# Patient Record
Sex: Female | Born: 1975 | Race: Black or African American | Hispanic: No | Marital: Single | Smoking: Never smoker
Health system: Southern US, Community
[De-identification: ages and names within clinical notes are randomized; demographics above are authoritative.]

## PROBLEM LIST (undated history)

## (undated) DIAGNOSIS — G4730 Sleep apnea, unspecified: Secondary | ICD-10-CM

## (undated) DIAGNOSIS — I10 Essential (primary) hypertension: Secondary | ICD-10-CM

## (undated) DIAGNOSIS — F419 Anxiety disorder, unspecified: Secondary | ICD-10-CM

## (undated) DIAGNOSIS — F32A Depression, unspecified: Secondary | ICD-10-CM

## (undated) DIAGNOSIS — R011 Cardiac murmur, unspecified: Secondary | ICD-10-CM

## (undated) DIAGNOSIS — M1990 Unspecified osteoarthritis, unspecified site: Secondary | ICD-10-CM

## (undated) DIAGNOSIS — D649 Anemia, unspecified: Secondary | ICD-10-CM

## (undated) DIAGNOSIS — K219 Gastro-esophageal reflux disease without esophagitis: Secondary | ICD-10-CM

## (undated) HISTORY — DX: Gastro-esophageal reflux disease without esophagitis: K21.9

## (undated) HISTORY — DX: Sleep apnea, unspecified: G47.30

## (undated) HISTORY — DX: Cardiac murmur, unspecified: R01.1

## (undated) HISTORY — DX: Anemia, unspecified: D64.9

## (undated) HISTORY — DX: Depression, unspecified: F32.A

## (undated) HISTORY — DX: Anxiety disorder, unspecified: F41.9

## (undated) HISTORY — PX: BREAST SURGERY: SHX581

## (undated) HISTORY — PX: FOOT SURGERY: SHX648

---

## 2000-02-22 ENCOUNTER — Emergency Department (HOSPITAL_COMMUNITY): Admission: EM | Admit: 2000-02-22 | Discharge: 2000-02-22 | Payer: Self-pay | Admitting: Emergency Medicine

## 2000-03-31 ENCOUNTER — Emergency Department (HOSPITAL_COMMUNITY): Admission: EM | Admit: 2000-03-31 | Discharge: 2000-03-31 | Payer: Self-pay | Admitting: *Deleted

## 2000-12-10 ENCOUNTER — Other Ambulatory Visit: Admission: RE | Admit: 2000-12-10 | Discharge: 2000-12-10 | Payer: Self-pay | Admitting: Obstetrics and Gynecology

## 2000-12-17 ENCOUNTER — Ambulatory Visit (HOSPITAL_COMMUNITY): Admission: RE | Admit: 2000-12-17 | Discharge: 2000-12-17 | Payer: Self-pay | Admitting: Obstetrics and Gynecology

## 2000-12-17 ENCOUNTER — Encounter: Payer: Self-pay | Admitting: Obstetrics and Gynecology

## 2001-02-12 ENCOUNTER — Inpatient Hospital Stay (HOSPITAL_COMMUNITY): Admission: AD | Admit: 2001-02-12 | Discharge: 2001-02-12 | Payer: Self-pay | Admitting: Obstetrics and Gynecology

## 2001-02-12 ENCOUNTER — Encounter: Payer: Self-pay | Admitting: Obstetrics and Gynecology

## 2001-03-18 ENCOUNTER — Inpatient Hospital Stay (HOSPITAL_COMMUNITY): Admission: AD | Admit: 2001-03-18 | Discharge: 2001-03-18 | Payer: Self-pay | Admitting: Obstetrics & Gynecology

## 2001-06-29 ENCOUNTER — Inpatient Hospital Stay (HOSPITAL_COMMUNITY): Admission: AD | Admit: 2001-06-29 | Discharge: 2001-06-29 | Payer: Self-pay | Admitting: Obstetrics and Gynecology

## 2001-07-18 ENCOUNTER — Inpatient Hospital Stay (HOSPITAL_COMMUNITY): Admission: AD | Admit: 2001-07-18 | Discharge: 2001-07-18 | Payer: Self-pay | Admitting: Obstetrics and Gynecology

## 2001-07-22 ENCOUNTER — Inpatient Hospital Stay (HOSPITAL_COMMUNITY): Admission: AD | Admit: 2001-07-22 | Discharge: 2001-07-22 | Payer: Self-pay | Admitting: Obstetrics & Gynecology

## 2001-07-22 ENCOUNTER — Inpatient Hospital Stay (HOSPITAL_COMMUNITY): Admission: AD | Admit: 2001-07-22 | Discharge: 2001-07-22 | Payer: Self-pay | Admitting: Obstetrics and Gynecology

## 2001-07-23 ENCOUNTER — Inpatient Hospital Stay (HOSPITAL_COMMUNITY): Admission: AD | Admit: 2001-07-23 | Discharge: 2001-07-25 | Payer: Self-pay | Admitting: Obstetrics and Gynecology

## 2002-03-08 ENCOUNTER — Emergency Department (HOSPITAL_COMMUNITY): Admission: EM | Admit: 2002-03-08 | Discharge: 2002-03-08 | Payer: Self-pay | Admitting: Emergency Medicine

## 2002-06-07 ENCOUNTER — Emergency Department (HOSPITAL_COMMUNITY): Admission: EM | Admit: 2002-06-07 | Discharge: 2002-06-07 | Payer: Self-pay | Admitting: Emergency Medicine

## 2002-08-02 ENCOUNTER — Emergency Department (HOSPITAL_COMMUNITY): Admission: EM | Admit: 2002-08-02 | Discharge: 2002-08-02 | Payer: Self-pay | Admitting: Emergency Medicine

## 2002-08-14 ENCOUNTER — Emergency Department (HOSPITAL_COMMUNITY): Admission: EM | Admit: 2002-08-14 | Discharge: 2002-08-15 | Payer: Self-pay | Admitting: Emergency Medicine

## 2003-03-04 ENCOUNTER — Emergency Department (HOSPITAL_COMMUNITY): Admission: AD | Admit: 2003-03-04 | Discharge: 2003-03-04 | Payer: Self-pay | Admitting: Family Medicine

## 2003-05-04 ENCOUNTER — Emergency Department (HOSPITAL_COMMUNITY): Admission: EM | Admit: 2003-05-04 | Discharge: 2003-05-04 | Payer: Self-pay | Admitting: Emergency Medicine

## 2003-05-14 ENCOUNTER — Emergency Department (HOSPITAL_COMMUNITY): Admission: AD | Admit: 2003-05-14 | Discharge: 2003-05-14 | Payer: Self-pay | Admitting: Family Medicine

## 2003-06-25 ENCOUNTER — Emergency Department (HOSPITAL_COMMUNITY): Admission: EM | Admit: 2003-06-25 | Discharge: 2003-06-25 | Payer: Self-pay | Admitting: Emergency Medicine

## 2003-08-03 ENCOUNTER — Emergency Department (HOSPITAL_COMMUNITY): Admission: EM | Admit: 2003-08-03 | Discharge: 2003-08-03 | Payer: Self-pay | Admitting: Emergency Medicine

## 2003-09-20 ENCOUNTER — Emergency Department (HOSPITAL_COMMUNITY): Admission: EM | Admit: 2003-09-20 | Discharge: 2003-09-20 | Payer: Self-pay | Admitting: Family Medicine

## 2004-01-20 ENCOUNTER — Emergency Department (HOSPITAL_COMMUNITY): Admission: EM | Admit: 2004-01-20 | Discharge: 2004-01-21 | Payer: Self-pay | Admitting: Emergency Medicine

## 2004-02-29 ENCOUNTER — Emergency Department (HOSPITAL_COMMUNITY): Admission: EM | Admit: 2004-02-29 | Discharge: 2004-02-29 | Payer: Self-pay | Admitting: Emergency Medicine

## 2004-06-08 ENCOUNTER — Emergency Department (HOSPITAL_COMMUNITY): Admission: EM | Admit: 2004-06-08 | Discharge: 2004-06-08 | Payer: Self-pay | Admitting: Emergency Medicine

## 2004-07-13 ENCOUNTER — Emergency Department (HOSPITAL_COMMUNITY): Admission: EM | Admit: 2004-07-13 | Discharge: 2004-07-13 | Payer: Self-pay | Admitting: Emergency Medicine

## 2004-08-15 ENCOUNTER — Inpatient Hospital Stay (HOSPITAL_COMMUNITY): Admission: AD | Admit: 2004-08-15 | Discharge: 2004-08-15 | Payer: Self-pay | Admitting: Obstetrics and Gynecology

## 2004-08-22 ENCOUNTER — Inpatient Hospital Stay (HOSPITAL_COMMUNITY): Admission: AD | Admit: 2004-08-22 | Discharge: 2004-08-22 | Payer: Self-pay | Admitting: Obstetrics and Gynecology

## 2004-08-22 ENCOUNTER — Encounter (INDEPENDENT_AMBULATORY_CARE_PROVIDER_SITE_OTHER): Payer: Self-pay | Admitting: Specialist

## 2004-11-08 ENCOUNTER — Emergency Department (HOSPITAL_COMMUNITY): Admission: EM | Admit: 2004-11-08 | Discharge: 2004-11-08 | Payer: Self-pay | Admitting: Emergency Medicine

## 2004-12-22 ENCOUNTER — Emergency Department (HOSPITAL_COMMUNITY): Admission: EM | Admit: 2004-12-22 | Discharge: 2004-12-22 | Payer: Self-pay | Admitting: Family Medicine

## 2005-09-19 ENCOUNTER — Emergency Department (HOSPITAL_COMMUNITY): Admission: EM | Admit: 2005-09-19 | Discharge: 2005-09-19 | Payer: Self-pay | Admitting: Family Medicine

## 2005-11-08 ENCOUNTER — Inpatient Hospital Stay (HOSPITAL_COMMUNITY): Admission: AD | Admit: 2005-11-08 | Discharge: 2005-11-08 | Payer: Self-pay | Admitting: Gynecology

## 2006-01-18 ENCOUNTER — Inpatient Hospital Stay (HOSPITAL_COMMUNITY): Admission: AD | Admit: 2006-01-18 | Discharge: 2006-01-18 | Payer: Self-pay | Admitting: Obstetrics & Gynecology

## 2006-02-14 ENCOUNTER — Inpatient Hospital Stay (HOSPITAL_COMMUNITY): Admission: AD | Admit: 2006-02-14 | Discharge: 2006-02-14 | Payer: Self-pay | Admitting: Obstetrics & Gynecology

## 2006-02-14 ENCOUNTER — Ambulatory Visit: Payer: Self-pay | Admitting: Obstetrics & Gynecology

## 2006-04-18 ENCOUNTER — Inpatient Hospital Stay (HOSPITAL_COMMUNITY): Admission: AD | Admit: 2006-04-18 | Discharge: 2006-04-18 | Payer: Self-pay | Admitting: Obstetrics and Gynecology

## 2006-05-18 ENCOUNTER — Inpatient Hospital Stay (HOSPITAL_COMMUNITY): Admission: AD | Admit: 2006-05-18 | Discharge: 2006-05-18 | Payer: Self-pay | Admitting: Obstetrics and Gynecology

## 2006-06-02 ENCOUNTER — Inpatient Hospital Stay (HOSPITAL_COMMUNITY): Admission: AD | Admit: 2006-06-02 | Discharge: 2006-06-02 | Payer: Self-pay | Admitting: Obstetrics & Gynecology

## 2006-06-08 ENCOUNTER — Inpatient Hospital Stay (HOSPITAL_COMMUNITY): Admission: AD | Admit: 2006-06-08 | Discharge: 2006-06-10 | Payer: Self-pay | Admitting: Obstetrics & Gynecology

## 2007-04-03 ENCOUNTER — Other Ambulatory Visit: Payer: Self-pay | Admitting: Family Medicine

## 2007-04-03 ENCOUNTER — Emergency Department (HOSPITAL_COMMUNITY): Admission: EM | Admit: 2007-04-03 | Discharge: 2007-04-03 | Payer: Self-pay | Admitting: Emergency Medicine

## 2007-07-15 ENCOUNTER — Emergency Department (HOSPITAL_COMMUNITY): Admission: EM | Admit: 2007-07-15 | Discharge: 2007-07-15 | Payer: Self-pay | Admitting: Family Medicine

## 2007-07-23 ENCOUNTER — Emergency Department (HOSPITAL_COMMUNITY): Admission: EM | Admit: 2007-07-23 | Discharge: 2007-07-23 | Payer: Self-pay | Admitting: Emergency Medicine

## 2010-01-26 ENCOUNTER — Emergency Department (HOSPITAL_COMMUNITY): Admission: EM | Admit: 2010-01-26 | Discharge: 2010-01-26 | Payer: Self-pay | Admitting: Emergency Medicine

## 2010-08-04 LAB — POCT CARDIAC MARKERS
CKMB, poc: <1 ng/mL — ABNORMAL LOW (ref 1.0–8.0)
Myoglobin, poc: 53.1 ng/mL (ref 12–200)
Troponin i, poc: <0.05 ng/mL (ref 0.00–0.09)

## 2010-08-04 LAB — POCT I-STAT, CHEM 8
BUN: 11 mg/dL (ref 6–23)
Calcium, Ion: 1.14 mmol/L (ref 1.12–1.32)
Chloride: 103 mEq/L (ref 96–112)
Glucose, Bld: 88 mg/dL (ref 70–99)
Sodium: 139 mEq/L (ref 135–145)
TCO2: 27 mmol/L (ref 0–100)

## 2010-08-10 ENCOUNTER — Emergency Department (HOSPITAL_COMMUNITY)
Admission: EM | Admit: 2010-08-10 | Discharge: 2010-08-10 | Disposition: A | Discharge status: Home / Self Care | Payer: Medicaid Other | Attending: Emergency Medicine | Admitting: Emergency Medicine

## 2010-08-10 DIAGNOSIS — H9209 Otalgia, unspecified ear: Secondary | ICD-10-CM | POA: Insufficient documentation

## 2010-08-10 DIAGNOSIS — J329 Chronic sinusitis, unspecified: Secondary | ICD-10-CM | POA: Insufficient documentation

## 2010-08-10 DIAGNOSIS — J3489 Other specified disorders of nose and nasal sinuses: Secondary | ICD-10-CM | POA: Insufficient documentation

## 2010-08-10 DIAGNOSIS — J020 Streptococcal pharyngitis: Secondary | ICD-10-CM | POA: Insufficient documentation

## 2010-08-10 DIAGNOSIS — I10 Essential (primary) hypertension: Secondary | ICD-10-CM | POA: Insufficient documentation

## 2010-08-26 ENCOUNTER — Emergency Department (HOSPITAL_COMMUNITY)
Admission: EM | Admit: 2010-08-26 | Discharge: 2010-08-26 | Disposition: A | Discharge status: Home / Self Care | Payer: Medicaid Other | Attending: Emergency Medicine | Admitting: Emergency Medicine

## 2010-08-26 DIAGNOSIS — I10 Essential (primary) hypertension: Secondary | ICD-10-CM | POA: Insufficient documentation

## 2010-08-26 DIAGNOSIS — F411 Generalized anxiety disorder: Secondary | ICD-10-CM | POA: Insufficient documentation

## 2011-02-13 LAB — RAPID STREP SCREEN (MED CTR MEBANE ONLY): Streptococcus, Group A Screen (Direct): NEGATIVE

## 2011-02-28 LAB — DIFFERENTIAL
Basophils Absolute: 0
Basophils Relative: 1
Eosinophils Relative: 1
Lymphocytes Relative: 23
Monocytes Relative: 8
Neutro Abs: 4.1

## 2011-02-28 LAB — I-STAT 8, (EC8 V) (CONVERTED LAB)
BUN: 14
Bicarbonate: 26.8 — ABNORMAL HIGH
Chloride: 106
Glucose, Bld: 85
HCT: 41
Operator id: 146091
Potassium: 4
TCO2: 28
pCO2, Ven: 52.9 — ABNORMAL HIGH

## 2011-02-28 LAB — CBC
MCV: 73.7 — ABNORMAL LOW
Platelets: 315
RDW: 15.3
WBC: 6

## 2011-02-28 LAB — POCT CARDIAC MARKERS
CKMB, poc: 1.1
CKMB, poc: <1 — ABNORMAL LOW
Operator id: 146091
Operator id: 265201
Troponin i, poc: <0.05

## 2011-02-28 LAB — POCT I-STAT CREATININE: Creatinine, Ser: 0.9

## 2011-02-28 LAB — POCT PREGNANCY, URINE
Operator id: 13344
Preg Test, Ur: NEGATIVE

## 2011-05-11 ENCOUNTER — Inpatient Hospital Stay (HOSPITAL_COMMUNITY)
Admission: AD | Admit: 2011-05-11 | Discharge: 2011-05-11 | Disposition: A | Discharge status: Home / Self Care | Payer: Self-pay | Source: Ambulatory Visit | Attending: Obstetrics & Gynecology | Admitting: Obstetrics & Gynecology

## 2011-05-11 ENCOUNTER — Encounter (HOSPITAL_COMMUNITY): Payer: Self-pay | Admitting: *Deleted

## 2011-05-11 DIAGNOSIS — M25519 Pain in unspecified shoulder: Secondary | ICD-10-CM | POA: Insufficient documentation

## 2011-05-11 DIAGNOSIS — M545 Low back pain, unspecified: Secondary | ICD-10-CM | POA: Insufficient documentation

## 2011-05-11 DIAGNOSIS — R109 Unspecified abdominal pain: Secondary | ICD-10-CM | POA: Insufficient documentation

## 2011-05-11 HISTORY — DX: Essential (primary) hypertension: I10

## 2011-05-11 LAB — POCT PREGNANCY, URINE: Preg Test, Ur: NEGATIVE

## 2011-05-11 LAB — WET PREP, GENITAL: Yeast Wet Prep HPF POC: NONE SEEN

## 2011-05-11 LAB — URINALYSIS, ROUTINE W REFLEX MICROSCOPIC
Bilirubin Urine: NEGATIVE
Nitrite: NEGATIVE
Specific Gravity, Urine: 1.025 (ref 1.005–1.030)

## 2011-05-11 LAB — URINE MICROSCOPIC-ADD ON

## 2011-05-11 NOTE — Progress Notes (Signed)
Pt in c/o one episode of sharp pain in right shoulder that radiates down back last night.  Reports numbness in right arm yesterday, slightly weak today.  Reports lower back pain and mild lower abdominal cramping.  Denies any bleeding or discharge.

## 2011-05-11 NOTE — Progress Notes (Addendum)
Yesterday wasn't feeling so good.  Last night had pain in rt upper shoulder, down into back.  Has been cramping past couple days.  No cycle/ has IUD. (usually does have one). Rt side was numb last night, is still kind of week.

## 2011-05-12 LAB — GC/CHLAMYDIA PROBE AMP, GENITAL
Chlamydia, DNA Probe: NEGATIVE
GC Probe Amp, Genital: NEGATIVE

## 2011-12-08 ENCOUNTER — Emergency Department (HOSPITAL_COMMUNITY)
Admission: EM | Admit: 2011-12-08 | Discharge: 2011-12-08 | Discharge status: Against Medical Advice | Payer: Medicaid Other | Attending: Emergency Medicine | Admitting: Emergency Medicine

## 2011-12-08 ENCOUNTER — Encounter (HOSPITAL_COMMUNITY): Payer: Self-pay | Admitting: *Deleted

## 2011-12-08 DIAGNOSIS — R109 Unspecified abdominal pain: Secondary | ICD-10-CM | POA: Insufficient documentation

## 2011-12-08 LAB — CBC WITH DIFFERENTIAL/PLATELET
Basophils Absolute: 0 K/uL (ref 0.0–0.1)
Basophils Relative: 0 % (ref 0–1)
Eosinophils Absolute: 0.1 K/uL (ref 0.0–0.7)
Eosinophils Relative: 1 % (ref 0–5)
HCT: 34.4 % — ABNORMAL LOW (ref 36.0–46.0)
Hemoglobin: 10.9 g/dL — ABNORMAL LOW (ref 12.0–15.0)
Lymphocytes Relative: 16 % (ref 12–46)
Lymphs Abs: 1.4 K/uL (ref 0.7–4.0)
MCH: 23.6 pg — ABNORMAL LOW (ref 26.0–34.0)
MCHC: 31.7 g/dL (ref 30.0–36.0)
MCV: 74.5 fL — ABNORMAL LOW (ref 78.0–100.0)
Monocytes Absolute: 0.6 K/uL (ref 0.1–1.0)
Monocytes Relative: 7 % (ref 3–12)
Neutro Abs: 6.9 K/uL (ref 1.7–7.7)
Neutrophils Relative %: 76 % (ref 43–77)
Platelets: 325 K/uL (ref 150–400)
RBC: 4.62 MIL/uL (ref 3.87–5.11)
RDW: 15.2 % (ref 11.5–15.5)
WBC: 9 K/uL (ref 4.0–10.5)

## 2011-12-08 LAB — URINE MICROSCOPIC-ADD ON

## 2011-12-08 LAB — BASIC METABOLIC PANEL
BUN: 10 mg/dL (ref 6–23)
CO2: 27 mEq/L (ref 19–32)
Calcium: 9.2 mg/dL (ref 8.4–10.5)
Glucose, Bld: 89 mg/dL (ref 70–99)
Potassium: 3.7 mEq/L (ref 3.5–5.1)
Sodium: 132 mEq/L — ABNORMAL LOW (ref 135–145)

## 2011-12-08 LAB — LIPASE, BLOOD: Lipase: 30 U/L (ref 11–59)

## 2011-12-08 LAB — URINALYSIS, ROUTINE W REFLEX MICROSCOPIC
Bilirubin Urine: NEGATIVE
Glucose, UA: NEGATIVE mg/dL
Hgb urine dipstick: NEGATIVE
Specific Gravity, Urine: 1.026 (ref 1.005–1.030)

## 2011-12-08 LAB — POCT PREGNANCY, URINE: Preg Test, Ur: NEGATIVE

## 2011-12-08 NOTE — ED Notes (Signed)
Pt c/o abd pain that started 2-3days ago, gradually getting worse, reports pain radiates around right side to back. Denies n/v.

## 2012-05-22 ENCOUNTER — Emergency Department (HOSPITAL_COMMUNITY)
Admission: EM | Admit: 2012-05-22 | Discharge: 2012-05-22 | Disposition: A | Discharge status: Home / Self Care | Payer: Medicaid Other | Attending: Emergency Medicine | Admitting: Emergency Medicine

## 2012-05-22 ENCOUNTER — Encounter (HOSPITAL_COMMUNITY): Payer: Self-pay | Admitting: Nurse Practitioner

## 2012-05-22 DIAGNOSIS — I10 Essential (primary) hypertension: Secondary | ICD-10-CM | POA: Insufficient documentation

## 2012-05-22 DIAGNOSIS — Z79899 Other long term (current) drug therapy: Secondary | ICD-10-CM | POA: Insufficient documentation

## 2012-05-22 DIAGNOSIS — M2550 Pain in unspecified joint: Secondary | ICD-10-CM | POA: Insufficient documentation

## 2012-05-22 DIAGNOSIS — M129 Arthropathy, unspecified: Secondary | ICD-10-CM | POA: Insufficient documentation

## 2012-05-22 DIAGNOSIS — Z791 Long term (current) use of non-steroidal anti-inflammatories (NSAID): Secondary | ICD-10-CM | POA: Insufficient documentation

## 2012-05-22 HISTORY — DX: Unspecified osteoarthritis, unspecified site: M19.90

## 2012-05-22 MED ORDER — TRAMADOL HCL 50 MG PO TABS: 50.0000 mg | ORAL_TABLET | Freq: Four times a day (QID) | ORAL | Status: DC | PRN

## 2012-05-22 MED ORDER — OXYCODONE-ACETAMINOPHEN 5-325 MG PO TABS
1.0000 | ORAL_TABLET | Freq: Once | ORAL | Status: AC
  Administered 2012-05-22: 1 via ORAL
  Filled 2012-05-22: qty 1

## 2012-05-22 MED ORDER — PREDNISONE 20 MG PO TABS
60.0000 mg | ORAL_TABLET | Freq: Once | ORAL | Status: AC
  Administered 2012-05-22: 60 mg via ORAL
  Filled 2012-05-22: qty 3

## 2012-05-22 MED ORDER — PREDNISONE 10 MG PO TABS: 20.0000 mg | ORAL_TABLET | Freq: Every day | ORAL | Status: DC

## 2012-05-22 NOTE — ED Provider Notes (Signed)
History  Scribed for Remi Haggard, FNP/Melanie Fredderick Phenix, MD, the patient was seen in room TR10C/TR10C. This chart was scribed by Candelaria Stagers. The patient's care started at 8:15 PM   CSN: [id number]  Arrival date & time 05/22/12  4098   First MD Initiated Contact with Patient 05/22/12 1928      Chief Complaint  Patient presents with  . Generalized Body Aches     The history is provided by the patient. No language interpreter was used.   Michele Lang is a 37 y.o. female who presents to the Emergency Department complaining of continued joint pain and swelling that started about five months.  She reports her knees hurt the worst.  Pt has seen a specialist and was prescribed prednisone and ibuprofen.  She is now having difficulty with ADLS.  She has had xrays and a thyroid test.  She is out of medication and does not see the specialist for a few weeks.  Pt reports that the prednisone helps with mobility and ADL.  Pt has h/o HTN.   She is seeing Dr. Kellie Simmering     Past Medical History  Diagnosis Date  . Hypertension   . Arthritis     Past Surgical History  Procedure Date  . Breast surgery   . Foot surgery     Family History  Problem Relation Age of Onset  . Hypertension Mother   . Heart disease Mother   . Hypertension Father   . Hypertension Sister   . Depression Maternal Uncle   . Depression Maternal Grandmother     History  Substance Use Topics  . Smoking status: Never Smoker   . Smokeless tobacco: Not on file  . Alcohol Use: No    OB History    Grav Para Term Preterm Abortions TAB SAB Ect Mult Living   4 3 3  0 1 0 1 0 0 3      Review of Systems  Constitutional: Negative for fever.  Gastrointestinal: Negative for nausea and vomiting.  Musculoskeletal: Positive for arthralgias.       Joint pain all over.    Allergies  Penicillins  Home Medications   Current Outpatient Rx  Name  Route  Sig  Dispense  Refill  . HYDROCHLOROTHIAZIDE 12.5 MG PO  CAPS   Oral   Take 12.5 mg by mouth daily.         . IBUPROFEN 200 MG PO TABS   Oral   Take 400 mg by mouth every 6 (six) hours as needed. Patient takes for pain         . LOSARTAN POTASSIUM 100 MG PO TABS   Oral   Take 100 mg by mouth daily.         Valetta Fuller HOT EX   Apply externally   Apply 1 application topically daily as needed. For knee pain.         Marland Kitchen PREDNISONE 20 MG PO TABS   Oral   Take 20 mg by mouth daily.           BP 148/88  Pulse 109  Temp 98.4 F (36.9 C) (Oral)  Resp 16  SpO2 98%  Physical Exam  Nursing note and vitals reviewed. Constitutional: She is oriented to person, place, and time. She appears well-developed and well-nourished. No distress.  HENT:  Head: Normocephalic and atraumatic.  Eyes: EOM are normal.  Neck: Neck supple. No tracheal deviation present.  Cardiovascular: Normal rate.   Pulmonary/Chest: Effort normal. No  respiratory distress.  Abdominal: There is no tenderness.  Musculoskeletal: Normal range of motion.       Knees and elbows cools to touch.  No swelling.    Neurological: She is alert and oriented to person, place, and time.  Skin: Skin is warm and dry.  Psychiatric: She has a normal mood and affect. Her behavior is normal.    ED Course  Procedures   DIAGNOSTIC STUDIES: Oxygen Saturation is 98% on room air, normal by my interpretation.    COORDINATION OF CARE:     Labs Reviewed - No data to display No results found.   No diagnosis found.    MDM  Joint pain that is chronic. She goes to a rheumatologist.  Pending appointment.  Out of meds.  Rx for prednisone and percocet .  She will call tomorrow for an appointment to see if she can be seen sooner.  No erythema or joint swelling.  Cool to touch.  She understands to return for worsening symptoms or fever. I personally performed the services described in this documentation, which was scribed in my presence. The recorded information has been reviewed and is  accurate.       Remi Haggard, NP 05/23/12 1255

## 2012-05-22 NOTE — ED Notes (Signed)
C/o "joint and muscle pain since august." reports she went to an arthritis specialist for same and was given prednisone and ibuprofen with no relief

## 2012-05-23 NOTE — ED Provider Notes (Signed)
Medical screening examination/treatment/procedure(s) were performed by non-physician practitioner and as supervising physician I was immediately available for consultation/collaboration.   Rolan Bucco, MD 05/23/12 [phone]

## 2012-11-08 ENCOUNTER — Encounter (HOSPITAL_COMMUNITY): Payer: Self-pay | Admitting: *Deleted

## 2012-11-08 ENCOUNTER — Emergency Department (HOSPITAL_COMMUNITY): Payer: Medicaid Other

## 2012-11-08 ENCOUNTER — Emergency Department (HOSPITAL_COMMUNITY)
Admission: EM | Admit: 2012-11-08 | Discharge: 2012-11-08 | Disposition: A | Discharge status: Home / Self Care | Payer: Medicaid Other | Attending: Emergency Medicine | Admitting: Emergency Medicine

## 2012-11-08 DIAGNOSIS — Z880 Allergy status to penicillin: Secondary | ICD-10-CM | POA: Insufficient documentation

## 2012-11-08 DIAGNOSIS — Z79899 Other long term (current) drug therapy: Secondary | ICD-10-CM | POA: Insufficient documentation

## 2012-11-08 DIAGNOSIS — J90 Pleural effusion, not elsewhere classified: Secondary | ICD-10-CM | POA: Insufficient documentation

## 2012-11-08 DIAGNOSIS — R079 Chest pain, unspecified: Secondary | ICD-10-CM

## 2012-11-08 DIAGNOSIS — R110 Nausea: Secondary | ICD-10-CM | POA: Insufficient documentation

## 2012-11-08 DIAGNOSIS — M129 Arthropathy, unspecified: Secondary | ICD-10-CM | POA: Insufficient documentation

## 2012-11-08 DIAGNOSIS — R0602 Shortness of breath: Secondary | ICD-10-CM | POA: Insufficient documentation

## 2012-11-08 DIAGNOSIS — I10 Essential (primary) hypertension: Secondary | ICD-10-CM | POA: Insufficient documentation

## 2012-11-08 LAB — COMPREHENSIVE METABOLIC PANEL
ALT: 31 U/L (ref 0–35)
AST: 23 U/L (ref 0–37)
Albumin: 3.3 g/dL — ABNORMAL LOW (ref 3.5–5.2)
Alkaline Phosphatase: 72 U/L (ref 39–117)
Potassium: 3.1 mEq/L — ABNORMAL LOW (ref 3.5–5.1)
Sodium: 137 mEq/L (ref 135–145)
Total Protein: 7.7 g/dL (ref 6.0–8.3)

## 2012-11-08 LAB — CBC
MCHC: 31.2 g/dL (ref 30.0–36.0)
Platelets: ADEQUATE 10*3/uL (ref 150–400)
RDW: 15.4 % (ref 11.5–15.5)

## 2012-11-08 LAB — D-DIMER, QUANTITATIVE: D-Dimer, Quant: 3.54 ug/mL-FEU — ABNORMAL HIGH (ref 0.00–0.48)

## 2012-11-08 MED ORDER — KETOROLAC TROMETHAMINE 30 MG/ML IJ SOLN
30.0000 mg | Freq: Once | INTRAMUSCULAR | Status: AC
  Administered 2012-11-08: 30 mg via INTRAVENOUS
  Filled 2012-11-08: qty 1

## 2012-11-08 MED ORDER — IOHEXOL 350 MG/ML SOLN
80.0000 mL | Freq: Once | INTRAVENOUS | Status: AC | PRN
  Administered 2012-11-08: 80 mL via INTRAVENOUS

## 2012-11-08 MED ORDER — POTASSIUM CHLORIDE CRYS ER 20 MEQ PO TBCR
40.0000 meq | EXTENDED_RELEASE_TABLET | Freq: Once | ORAL | Status: AC
  Administered 2012-11-08: 40 meq via ORAL
  Filled 2012-11-08: qty 2

## 2012-11-08 NOTE — ED Notes (Signed)
The patient is AOx4 and comfortable with the discharge instructions. 

## 2012-11-08 NOTE — ED Notes (Signed)
Multiple complaints. Reports that she was lying down today and had pain to left side and sob. Difficulty walking due to arthritis pain and also reports cramping recently to bilateral lower legs. Ambulatory at triage, airway intact, no resp distress noted.

## 2012-11-08 NOTE — ED Provider Notes (Signed)
History     CSN: [id number]  Arrival date & time 11/08/12  1351   None     Chief Complaint  Patient presents with  . Shortness of Breath  . Pain    (Consider location/radiation/quality/duration/timing/severity/associated sxs/prior treatment) Patient is a 37 y.o. female presenting with chest pain.  Chest Pain Pain location:  L chest Pain quality: sharp   Pain radiates to:  Does not radiate Pain radiates to the back: no   Pain severity:  Mild Onset quality:  Sudden Duration:  2 days Timing:  Intermittent Progression:  Unchanged Chronicity:  New Context: breathing   Relieved by:  Nothing Worsened by:  Deep breathing Ineffective treatments:  None tried Associated symptoms: nausea   Associated symptoms: no abdominal pain, no claudication, no cough, no dizziness, no fever, no palpitations, no shortness of breath, not vomiting and no weakness   Risk factors: no immobilization, no prior DVT/PE and no smoking     Past Medical History  Diagnosis Date  . Hypertension   . Arthritis     Past Surgical History  Procedure Laterality Date  . Breast surgery    . Foot surgery      Family History  Problem Relation Age of Onset  . Hypertension Mother   . Heart disease Mother   . Hypertension Father   . Hypertension Sister   . Depression Maternal Uncle   . Depression Maternal Grandmother     History  Substance Use Topics  . Smoking status: Never Smoker   . Smokeless tobacco: Not on file  . Alcohol Use: No    OB History   Grav Para Term Preterm Abortions TAB SAB Ect Mult Living   4 3 3  0 1 0 1 0 0 3      Review of Systems  Constitutional: Negative for fever and chills.  HENT: Negative for congestion and rhinorrhea.   Respiratory: Negative for cough, chest tightness and shortness of breath.   Cardiovascular: Positive for chest pain. Negative for palpitations and claudication.  Gastrointestinal: Positive for nausea. Negative for vomiting, abdominal pain and  diarrhea.  Genitourinary: Negative for dysuria.  Neurological: Negative for dizziness and weakness.  All other systems reviewed and are negative.    Allergies  Penicillins  Home Medications   Current Outpatient Rx  Name  Route  Sig  Dispense  Refill  . hydrochlorothiazide (MICROZIDE) 12.5 MG capsule   Oral   Take 12.5 mg by mouth daily.         Marland Kitchen HYDROcodone-acetaminophen (NORCO/VICODIN) 5-325 MG per tablet   Oral   Take 1 tablet by mouth every 6 (six) hours as needed for pain.         Marland Kitchen ibuprofen (ADVIL,MOTRIN) 200 MG tablet   Oral   Take 400-800 mg by mouth every 6 (six) hours as needed for pain.          Marland Kitchen losartan (COZAAR) 100 MG tablet   Oral   Take 100 mg by mouth daily.         . methotrexate (RHEUMATREX) 2.5 MG tablet   Oral   Take 10 mg by mouth 2 (two) times a week. Tues, Weds only; Caution:Chemotherapy. Protect from light.           BP 157/99  Pulse 101  Temp(Src) 98.1 F (36.7 C) (Oral)  Resp 18  SpO2 95%  Physical Exam  Nursing note and vitals reviewed. Constitutional: She is oriented to person, place, and time. She appears well-developed and well-nourished.  No distress.  HENT:  Head: Normocephalic and atraumatic.  Mouth/Throat: Oropharynx is clear and moist.  Eyes: EOM are normal. Pupils are equal, round, and reactive to light.  Neck: Normal range of motion. Neck supple.  Cardiovascular: Regular rhythm and normal heart sounds.  Exam reveals no friction rub.   No murmur heard. Tachycardic to low 100s.    Pulmonary/Chest: Effort normal and breath sounds normal. No respiratory distress. She has no wheezes. She has no rales. She exhibits tenderness (mild TTP across left lower chest wall. ).  Abdominal: Soft. There is no tenderness. There is no rebound and no guarding.  Musculoskeletal: Normal range of motion. She exhibits no edema and no tenderness.  Lymphadenopathy:    She has no cervical adenopathy.  Neurological: She is alert and  oriented to person, place, and time.  Skin: Skin is warm and dry. No rash noted.  Psychiatric: She has a normal mood and affect. Her behavior is normal.    ED Course  Procedures (including critical care time)  Labs Reviewed  CBC - Abnormal; Notable for the following:    Hemoglobin 10.1 (*)    HCT 32.4 (*)    MCV 76.1 (*)    MCH 23.7 (*)    All other components within normal limits  COMPREHENSIVE METABOLIC PANEL - Abnormal; Notable for the following:    Potassium 3.1 (*)    Glucose, Bld 115 (*)    Albumin 3.3 (*)    All other components within normal limits  D-DIMER, QUANTITATIVE - Abnormal; Notable for the following:    D-Dimer, Quant 3.54 (*)    All other components within normal limits   Dg Chest 2 View  11/08/2012   *RADIOLOGY REPORT*  Clinical Data: Shortness of breath and chest pain  CHEST - 2 VIEW  Comparison:  January 26, 2010  Findings: There is airspace consolidation in the left lower lobe. Lungs are otherwise clear.  Heart is mildly enlarged with normal pulmonary vascularity.  No adenopathy.  No bone lesions.  IMPRESSION: Left lower lobe consolidation.   Original Report Authenticated By: Bretta Bang, M.D.   Ct Angio Chest Pe W/cm &/or Wo Cm  11/08/2012   *RADIOLOGY REPORT*  Clinical Data: Chest pain, concern pulmonary embolism, short of breath  CT ANGIOGRAPHY CHEST  Technique:  Multidetector CT imaging of the chest using the standard protocol during bolus administration of intravenous contrast. Multiplanar reconstructed images including MIPs were obtained and reviewed to evaluate the vascular anatomy.  Contrast: 80mL OMNIPAQUE IOHEXOL 350 MG/ML SOLN  Comparison: [facility] radiograph 11/08/2012  Findings: There are no filling defects in the pulmonary arteries to suggest acute pulmonary embolism.  No acute findings  of the aorta or great vessels.  No pericardial fluid.  Esophagus is normal.  There is a moderate left pleural effusion with associated basilar atelectasis.  Limited  view of the upper abdomen is unremarkable.  No axillary or supraclavicular adenopathy.  No mediastinal adenopathy. Degenerative osteophytosis of the thoracic spine.  IMPRESSION:  1.  No evidence of acute pulmonary embolism. 2.   Moderate left pleural effusion.   Original Report Authenticated By: Genevive Bi, M.D.     Date: 11/08/2012  Rate: 102  Rhythm: sinus tachycardia  QRS Axis: normal  Intervals: normal  ST/T Wave abnormalities: nonspecific T wave changes  Conduction Disutrbances:none  Narrative Interpretation:   Old EKG Reviewed: none available   1. Pleural effusion   2. Chest pain       MDM  3:04 PM 37yo  female with h/o RA on methotrexate here with two days of intermittent left lower sharp chest wall pain worse with deep breathing. Has been nauseated for past two days but no dyspnea. No fever or cough. Noted to be tachy to low 100s here. No h/o DVT/PE, no recent admissions or surgeries, no FH of DVT/PE. Will check cxr, labs, ekg and d dimer given tachycardia, pleuritic chest pain and RA but overall low risk.   7:30PM: CT neg for PE but shows left pleural effusion, no evidence of PNA. Pt has remained stable. Counseled on use of NSADs for pain. Will f/u with PCP for re-eval. Pt voiced understanding and dc'd home in stable condition.       Caren Hazy, MD 11/08/12 2311

## 2012-11-08 NOTE — ED Notes (Signed)
Left CP/rib pain non radiating x 2 days. States pain worse with deep breaths, palpation, mvmt. Denies recent cold, cough.

## 2012-11-08 NOTE — ED Notes (Signed)
Patient transported to CT 

## 2012-11-08 NOTE — ED Notes (Signed)
Patient transported to X-ray 

## 2012-11-10 NOTE — ED Provider Notes (Signed)
I saw and evaluated the patient, reviewed the resident's note and I agree with the findings and plan.  Toy Baker, MD 11/10/12 [phone]

## 2013-01-02 ENCOUNTER — Institutional Professional Consult (permissible substitution): Payer: Medicaid Other | Admitting: Internal Medicine

## 2013-01-03 ENCOUNTER — Encounter: Payer: Self-pay | Admitting: Internal Medicine

## 2013-01-03 ENCOUNTER — Ambulatory Visit (INDEPENDENT_AMBULATORY_CARE_PROVIDER_SITE_OTHER): Payer: Medicaid Other | Admitting: Internal Medicine

## 2013-01-03 ENCOUNTER — Institutional Professional Consult (permissible substitution): Payer: Medicaid Other | Admitting: Internal Medicine

## 2013-01-03 ENCOUNTER — Ambulatory Visit (INDEPENDENT_AMBULATORY_CARE_PROVIDER_SITE_OTHER)
Admission: RE | Admit: 2013-01-03 | Discharge: 2013-01-03 | Disposition: A | Discharge status: Home / Self Care | Payer: Medicaid Other | Source: Ambulatory Visit | Attending: Internal Medicine | Admitting: Internal Medicine

## 2013-01-03 VITALS — BP 164/96 | HR 81 | Temp 97.7°F | Ht 62.0 in | Wt 208.0 lb

## 2013-01-03 DIAGNOSIS — R05 Cough: Secondary | ICD-10-CM

## 2013-01-03 DIAGNOSIS — J90 Pleural effusion, not elsewhere classified: Secondary | ICD-10-CM

## 2013-01-03 DIAGNOSIS — I10 Essential (primary) hypertension: Secondary | ICD-10-CM

## 2013-01-03 NOTE — Progress Notes (Signed)
  Subjective:    Patient ID: Michele Lang, female    DOB: April 29, 1976    MRN: [id number]  HPI  69 yobf never smoker new onset arthritis in knees and ankles summer 2013 dx as RA by Kellie Simmering and started prednisone in fall of 2013 referred 01/03/2013 by Dr Loleta Chance for eval of pl effusion  01/03/2013 1st pulmonary eval / Sherene Sires cc new onset doe Winter 2014 assoc with dry cough esp lying down worse when prednisone tapered down > went to ER 11/08/12 with L CP ? off pred and mtx x 3 weeks with worse breathing = doe clearing, and arthritis, dx as L effusion s evidence PE or pna by Ct . > all symptoms better by ov on back on prednisone and mtx, arthritis also better.     No obvious daytime variabilty or assoc chronic cough or  chest tightness, subjective wheeze overt sinus or hb symptoms. No unusual exp hx or h/o childhood pna/ asthma or knowledge of premature birth.   Sleeping ok without nocturnal  or early am exacerbation  of respiratory  c/o's or need for noct saba. Also denies any obvious fluctuation of symptoms with weather or environmental changes or other aggravating or alleviating factors except as outlined above    Review of Systems  Constitutional: Negative for fever, chills and unexpected weight change.  HENT: Positive for sneezing. Negative for ear pain, nosebleeds, congestion, sore throat, rhinorrhea, trouble swallowing, dental problem, voice change, postnasal drip and sinus pressure.   Eyes: Negative for visual disturbance.  Respiratory: Positive for cough and shortness of breath. Negative for choking.   Cardiovascular: Negative for chest pain and leg swelling.  Gastrointestinal: Negative for vomiting, abdominal pain and diarrhea.  Genitourinary: Negative for difficulty urinating.  Musculoskeletal: Negative for arthralgias.  Skin: Negative for rash.  Neurological: Positive for headaches. Negative for tremors and syncope.  Hematological: Does not bruise/bleed easily.       Objective:    Physical Exam  amb bf nad  Wt Readings from Last 3 Encounters:  01/03/13 208 lb (94.348 kg)  12/08/11 186 lb (84.369 kg)    amb bm moderately obese, mod cushingnoid  HEENT: nl dentition, turbinates, and orophanx. Nl external ear canals without cough reflex   NECK :  without JVD/Nodes/TM/ nl carotid upstrokes bilaterally   LUNGS: no acc muscle use,  Min decrease bs at L base  CV:  RRR  no s3 or murmur or increase in P2, no edema   ABD:  soft and nontender with nl excursion in the supine position. No bruits or organomegaly, bowel sounds nl  MS:  warm without deformities, calf tenderness, cyanosis or clubbing/ mod ra changes bil MCP  SKIN: warm and dry without lesions    NEURO:  alert, approp, no deficits  CXR  01/03/2013 :  There is small residual left pleural effusion with left basilar atelectasis or infiltrate. No pulmonary edema.        Assessment & Plan:

## 2013-01-03 NOTE — Patient Instructions (Addendum)
Please remember to go to the x-ray department downstairs for your tests - we will call you with the results when they are available.       Late add : f/u ov with cxr in 3 months, sooner if symptoms recur For noct cough try pepcid 20 mg ac at hs x one month and f/u here if not resolved  Also noted bp up > monitor

## 2013-01-04 DIAGNOSIS — I10 Essential (primary) hypertension: Secondary | ICD-10-CM | POA: Insufficient documentation

## 2013-01-04 DIAGNOSIS — R05 Cough: Secondary | ICD-10-CM | POA: Insufficient documentation

## 2013-01-04 NOTE — Assessment & Plan Note (Addendum)
-   11/08/12 CT Mod L effusion off prednisone > only small effusion   Clinically this was a classic L Rheumatoid effusion with relatively mild CP assoc with flare of RA systemic symptoms and much better now that back on steroids and RA complaints in general have been controlled.  Typically do not need to tap the effusion in this setting.  I reserve the use of dx thoracentesis for symptomatic effusions or ones that occur incongruent with arthritis control which was not the case here.  Therefore rec  f/u ov with cxr in 3 months, sooner if symptoms recur

## 2013-01-04 NOTE — Assessment & Plan Note (Signed)
Not Adequate control on present rx, reviewed > no change in rx needed  For now, f/u primary care

## 2013-01-04 NOTE — Assessment & Plan Note (Signed)
prob noct gerd related to obesity > For noct cough try pepcid 20 mg ac at hs x one month and f/u here if not resolved

## 2013-01-06 NOTE — Progress Notes (Signed)
Quick Note:  ATC patient, no answer LMOMTCB ______ 

## 2013-01-09 ENCOUNTER — Telehealth: Payer: Self-pay | Admitting: Internal Medicine

## 2013-01-09 NOTE — Telephone Encounter (Signed)
C/w resolving effusion from RA, no change in recs made at ov , needs f/u in 6 weeks, sooner if conditions worsens

## 2013-01-09 NOTE — Telephone Encounter (Signed)
Dr. Sherene Sires please advise on CXR results. On resulted note it stated wrong results on wrong pt thanks

## 2013-01-09 NOTE — Telephone Encounter (Signed)
Called and spoke with pt and she is aware of cxr results per MW.  Pt will call back tomorrow to schedule a 6 wk follow up.  Nothing further is needed.

## 2013-01-13 ENCOUNTER — Telehealth: Payer: Self-pay | Admitting: *Deleted

## 2013-01-13 NOTE — Telephone Encounter (Signed)
Message copied by Christen Butter on Mon Jan 13, 2013 10:46 AM ------      Message from: Nyoka Cowden      Created: Sat Jan 04, 2013  1:07 PM       For noct cough try pepcid 20 mg ac at hs x one month and f/u here if not resolved       Also noted bp up > monitor at home or have Dr Loleta Chance recheck, avoid Na            When returns in one month needs pfts and cxr       ------

## 2013-01-13 NOTE — Telephone Encounter (Signed)
LMTCB for the pt 

## 2013-01-15 NOTE — Telephone Encounter (Signed)
LMTCB x2  

## 2013-01-22 ENCOUNTER — Encounter: Payer: Self-pay | Admitting: Internal Medicine

## 2013-01-22 NOTE — Telephone Encounter (Signed)
LMTCB again and will send a letter

## 2013-03-28 ENCOUNTER — Emergency Department (HOSPITAL_COMMUNITY)
Admission: EM | Admit: 2013-03-28 | Discharge: 2013-03-28 | Disposition: A | Discharge status: Home / Self Care | Payer: Medicaid Other | Attending: Emergency Medicine | Admitting: Emergency Medicine

## 2013-03-28 ENCOUNTER — Encounter (HOSPITAL_COMMUNITY): Payer: Self-pay | Admitting: Emergency Medicine

## 2013-03-28 DIAGNOSIS — Z79899 Other long term (current) drug therapy: Secondary | ICD-10-CM | POA: Insufficient documentation

## 2013-03-28 DIAGNOSIS — IMO0002 Reserved for concepts with insufficient information to code with codable children: Secondary | ICD-10-CM | POA: Insufficient documentation

## 2013-03-28 DIAGNOSIS — Z760 Encounter for issue of repeat prescription: Secondary | ICD-10-CM | POA: Insufficient documentation

## 2013-03-28 DIAGNOSIS — I10 Essential (primary) hypertension: Secondary | ICD-10-CM | POA: Insufficient documentation

## 2013-03-28 DIAGNOSIS — Z880 Allergy status to penicillin: Secondary | ICD-10-CM | POA: Insufficient documentation

## 2013-03-28 MED ORDER — METHOTREXATE 2.5 MG PO TABS: 12.5000 mg | ORAL_TABLET | ORAL | Status: DC

## 2013-03-28 MED ORDER — METHOTREXATE 2.5 MG PO TABS: 10.0000 mg | ORAL_TABLET | Freq: Once | ORAL | Status: DC

## 2013-03-28 MED ORDER — METHOTREXATE 2.5 MG PO TABS
12.5000 mg | ORAL_TABLET | Freq: Once | ORAL | Status: AC
  Administered 2013-03-28: 12.5 mg via ORAL
  Filled 2013-03-28: qty 5

## 2013-03-28 MED ORDER — METHOTREXATE 2.5 MG PO TABS: 2.5000 mg | ORAL_TABLET | Freq: Once | ORAL | Status: DC

## 2013-03-28 MED ORDER — PREDNISONE 10 MG PO TABS: 5.0000 mg | ORAL_TABLET | Freq: Every day | ORAL | Status: DC

## 2013-03-28 NOTE — ED Provider Notes (Signed)
CSN: [id number]     Arrival date & time 03/28/13  1602 History  This chart was scribed for non-physician practitioner, Emilia Beck, PA-C,working with Donnetta Hutching, MD, by Karle Plumber, ED Scribe.  This patient was seen in room TR07C/TR07C and the patient's care was started at 7:25 PM.  Chief Complaint  Patient presents with  . Pain   The history is provided by the patient. No language interpreter was used.   HPI Comments:  Michele Lang is a 37 y.o. female who presents to the Emergency Department complaining of chronic severe pain onset five days since she ran out of her Methotrexate she takes for arthritis. Pt states she has been having issues with her insurance and has not been able to get it from the pharmacy. Pt states her doctor is Dr. Kellie Simmering and has not been able to get in to see him due to the insurance issue. Pt denies any new or pertinent symptoms and states she just needs her medication refilled.  Past Medical History  Diagnosis Date  . Hypertension   . Arthritis    Past Surgical History  Procedure Laterality Date  . Breast surgery    . Foot surgery     Family History  Problem Relation Age of Onset  . Hypertension Mother   . Heart disease Mother   . Hypertension Father   . Hypertension Sister   . Depression Maternal Uncle   . Depression Maternal Grandmother   . Asthma Son   . Allergies Son   . Rheum arthritis Maternal Grandmother    History  Substance Use Topics  . Smoking status: Never Smoker   . Smokeless tobacco: Not on file  . Alcohol Use: No   OB History   Grav Para Term Preterm Abortions TAB SAB Ect Mult Living   4 3 3  0 1 0 1 0 0 3     Review of Systems  Musculoskeletal: Positive for arthralgias (secondary to arthritis).  All other systems reviewed and are negative.    Allergies  Penicillins  Home Medications   Current Outpatient Rx  Name  Route  Sig  Dispense  Refill  . hydrochlorothiazide (MICROZIDE) 12.5 MG capsule    Oral   Take 12.5 mg by mouth daily.         Marland Kitchen ibuprofen (ADVIL,MOTRIN) 200 MG tablet   Oral   Take 400-800 mg by mouth every 6 (six) hours as needed for pain.          Marland Kitchen losartan (COZAAR) 100 MG tablet   Oral   Take 100 mg by mouth daily.         . methotrexate (RHEUMATREX) 2.5 MG tablet   Oral   Take 10 mg by mouth 2 (two) times a week. Tues, Weds only; Caution:Chemotherapy. Protect from light.         . predniSONE (DELTASONE) 10 MG tablet   Oral   Take 10 mg by mouth daily.          Triage Vitals: BP 126/73  Pulse 115  Temp(Src) 98.4 F (36.9 C) (Oral)  Resp 18  Ht 5\' 1"  (1.549 m)  Wt 210 lb (95.255 kg)  BMI 39.70 kg/m2  SpO2 96% Physical Exam  Nursing note and vitals reviewed. Constitutional: She is oriented to person, place, and time. She appears well-developed and well-nourished. No distress.  HENT:  Head: Normocephalic and atraumatic.  Eyes: Conjunctivae are normal. No scleral icterus.  Neck: Neck supple.  Cardiovascular: Normal rate and  intact distal pulses.   Pulmonary/Chest: Effort normal. No stridor. No respiratory distress.  Abdominal: Normal appearance. She exhibits no distension.  Neurological: She is alert and oriented to person, place, and time.  Skin: Skin is warm and dry. No rash noted.  Psychiatric: She has a normal mood and affect. Her behavior is normal.    ED Course  Procedures (including critical care time) DIAGNOSTIC STUDIES: Oxygen Saturation is 96% on RA, adequate by my interpretation.   COORDINATION OF CARE: 7:27 PM- Will prescribe pt Methotrexate and Prednisone. Pt verbalizes understanding and agrees to plan.  Medications - No data to display  Labs Review Labs Reviewed - No data to display Imaging Review No results found.  EKG Interpretation   None       MDM   1. Medication refill     7:34 PM Patient will have methotrexate here and be discharged with refills of the same. Patient instructed to follow up with  here PCP.    I personally performed the services described in this documentation, which was scribed in my presence. The recorded information has been reviewed and is accurate.    Emilia Beck, PA-C 03/28/13 1936

## 2013-03-28 NOTE — ED Notes (Signed)
Presents with arthritis pain. Recently had a insurance change and when methotrexate ran out, insurance denied, pt unable to afford out of pocket pay. Out of medication for 5 days and arthritis pain is severe and affecting ADLs, unable to work.

## 2013-03-28 NOTE — ED Notes (Signed)
Contacted pharmacy regarding medication delay

## 2013-04-01 NOTE — ED Provider Notes (Signed)
Medical screening examination/treatment/procedure(s) were performed by non-physician practitioner and as supervising physician I was immediately available for consultation/collaboration.  EKG Interpretation   None        Donnetta Hutching, MD 04/01/13 1542

## 2013-04-05 ENCOUNTER — Ambulatory Visit: Payer: Medicaid Other

## 2013-04-05 ENCOUNTER — Telehealth: Payer: Self-pay | Admitting: Emergency Medicine

## 2013-04-05 NOTE — Telephone Encounter (Signed)
Left voice mail for pt. to call clinic.

## 2013-04-10 ENCOUNTER — Ambulatory Visit: Payer: Medicaid Other

## 2013-10-16 DIAGNOSIS — T8332XA Displacement of intrauterine contraceptive device, initial encounter: Secondary | ICD-10-CM

## 2013-10-16 NOTE — Progress Notes (Signed)
Patient referred to [place] from [facility] for malpositioned IUD. Per Dr. Harolyn Rutherford, patient needs U/S for location and then an appointment for removal and reinsertion. U/S scheduled for Wednesday June 3rd at 1300. Attempted to call patient-- number in valid; will send letter informing patient of U/S appointment. Records put in admin folder "referrals to be scheduled" to have a clinic appointment scheduled for patient for after U/S.

## 2013-10-22 ENCOUNTER — Ambulatory Visit (HOSPITAL_COMMUNITY): Payer: Medicaid Other

## 2013-10-29 ENCOUNTER — Ambulatory Visit (HOSPITAL_COMMUNITY): Admission: RE | Admit: 2013-10-29 | Payer: Medicaid Other | Source: Ambulatory Visit

## 2013-11-04 ENCOUNTER — Ambulatory Visit (HOSPITAL_COMMUNITY): Admission: RE | Admit: 2013-11-04 | Payer: Medicaid Other | Source: Ambulatory Visit

## 2013-11-04 ENCOUNTER — Encounter: Payer: Self-pay | Admitting: General Practice

## 2013-11-17 ENCOUNTER — Encounter: Payer: Self-pay | Admitting: General Practice

## 2013-11-22 ENCOUNTER — Emergency Department (HOSPITAL_COMMUNITY)
Admission: EM | Admit: 2013-11-22 | Discharge: 2013-11-22 | Disposition: A | Discharge status: Home / Self Care | Payer: Medicaid Other | Attending: Emergency Medicine | Admitting: Emergency Medicine

## 2013-11-22 ENCOUNTER — Encounter (HOSPITAL_COMMUNITY): Payer: Self-pay | Admitting: Emergency Medicine

## 2013-11-22 ENCOUNTER — Emergency Department (HOSPITAL_COMMUNITY): Payer: Medicaid Other

## 2013-11-22 DIAGNOSIS — M129 Arthropathy, unspecified: Secondary | ICD-10-CM | POA: Insufficient documentation

## 2013-11-22 DIAGNOSIS — IMO0002 Reserved for concepts with insufficient information to code with codable children: Secondary | ICD-10-CM | POA: Insufficient documentation

## 2013-11-22 DIAGNOSIS — Z79899 Other long term (current) drug therapy: Secondary | ICD-10-CM | POA: Insufficient documentation

## 2013-11-22 DIAGNOSIS — Z880 Allergy status to penicillin: Secondary | ICD-10-CM | POA: Insufficient documentation

## 2013-11-22 DIAGNOSIS — R0789 Other chest pain: Secondary | ICD-10-CM | POA: Diagnosis present

## 2013-11-22 DIAGNOSIS — I10 Essential (primary) hypertension: Secondary | ICD-10-CM | POA: Insufficient documentation

## 2013-11-22 LAB — CBC
HEMATOCRIT: 30.4 % — AB (ref 36.0–46.0)
HEMOGLOBIN: 9.4 g/dL — AB (ref 12.0–15.0)
MCH: 22.4 pg — ABNORMAL LOW (ref 26.0–34.0)
MCHC: 30.9 g/dL (ref 30.0–36.0)
MCV: 72.6 fL — ABNORMAL LOW (ref 78.0–100.0)
Platelets: 339 10*3/uL (ref 150–400)
RBC: 4.19 MIL/uL (ref 3.87–5.11)
RDW: 16.2 % — AB (ref 11.5–15.5)
WBC: 6 10*3/uL (ref 4.0–10.5)

## 2013-11-22 LAB — I-STAT TROPONIN, ED: Troponin i, poc: 0 ng/mL (ref 0.00–0.08)

## 2013-11-22 LAB — BASIC METABOLIC PANEL
Anion gap: 13 (ref 5–15)
BUN: 14 mg/dL (ref 6–23)
CHLORIDE: 102 meq/L (ref 96–112)
CO2: 24 meq/L (ref 19–32)
Calcium: 9 mg/dL (ref 8.4–10.5)
Creatinine, Ser: 0.64 mg/dL (ref 0.50–1.10)
GFR calc non Af Amer: >90 mL/min (ref >90)
GLUCOSE: 89 mg/dL (ref 70–99)
POTASSIUM: 3.7 meq/L (ref 3.7–5.3)
Sodium: 139 mEq/L (ref 137–147)

## 2013-11-22 MED ORDER — HYDROCHLOROTHIAZIDE 12.5 MG PO CAPS: 12.5000 mg | ORAL_CAPSULE | Freq: Every day | ORAL | Status: DC

## 2013-11-22 MED ORDER — OXYCODONE-ACETAMINOPHEN 5-325 MG PO TABS: 1.0000 | ORAL_TABLET | Freq: Four times a day (QID) | ORAL | Status: DC | PRN

## 2013-11-22 MED ORDER — HYDRALAZINE HCL 20 MG/ML IJ SOLN
10.0000 mg | Freq: Once | INTRAMUSCULAR | Status: AC
  Administered 2013-11-22: 10 mg via INTRAVENOUS
  Filled 2013-11-22 (×2): qty 1

## 2013-11-22 MED ORDER — OXYCODONE-ACETAMINOPHEN 5-325 MG PO TABS
1.0000 | ORAL_TABLET | Freq: Once | ORAL | Status: AC
  Administered 2013-11-22: 1 via ORAL
  Filled 2013-11-22: qty 1

## 2013-11-22 NOTE — ED Notes (Signed)
Notified Dr. Aline Brochure about pts VS

## 2013-11-22 NOTE — Discharge Instructions (Signed)

## 2013-11-22 NOTE — ED Notes (Signed)
Dr Harrison at bedside

## 2013-11-22 NOTE — ED Notes (Signed)
NAD noted. IV removed. VS are within normal range. Pt given discharge instructions. All questions answered. Pt discharged by wheelchair.

## 2013-11-22 NOTE — ED Notes (Signed)
Patient sts she cleaned bathtub with ajax and bleach last night. sts this morning had central chest heaviness 7/10 and SOB today. Pt in NAD, respirations even and unlabored. Radial pulses 2+ pt in sinus rhythm. Pt denies n/v/d lightheadedness or dizziness.   Poison control contacted sts CP and SOB unrelated to chemical exposure.

## 2013-11-22 NOTE — ED Provider Notes (Signed)
CSN: [id number]     Arrival date & time 11/22/13  1741 History   First MD Initiated Contact with Patient 11/22/13 1917     Chief Complaint  Patient presents with  . Chest Pain     (Consider location/radiation/quality/duration/timing/severity/associated sxs/prior Treatment) Patient is a 38 y.o. female presenting with chest pain. The history is provided by the patient.  Chest Pain Pain location:  L chest and R chest Pain quality: tightness   Pain radiates to:  Does not radiate Pain radiates to the back: no   Pain severity now: mild to mod. Onset quality:  Gradual Timing:  Constant Progression:  Unchanged Chronicity:  New Context comment:  After cleaning w/ ajax and bleach Relieved by:  Nothing Worsened by:  Movement Ineffective treatments:  None tried Associated symptoms: cough and shortness of breath   Associated symptoms: no abdominal pain, no back pain, no dizziness, no fatigue, no fever, no headache, no nausea and not vomiting     Past Medical History  Diagnosis Date  . Hypertension   . Arthritis    Past Surgical History  Procedure Laterality Date  . Breast surgery    . Foot surgery     Family History  Problem Relation Age of Onset  . Hypertension Mother   . Heart disease Mother   . Hypertension Father   . Hypertension Sister   . Depression Maternal Uncle   . Depression Maternal Grandmother   . Asthma Son   . Allergies Son   . Rheum arthritis Maternal Grandmother    History  Substance Use Topics  . Smoking status: Never Smoker   . Smokeless tobacco: Not on file  . Alcohol Use: No   OB History   Grav Para Term Preterm Abortions TAB SAB Ect Mult Living   4 3 3  0 1 0 1 0 0 3     Review of Systems  Constitutional: Negative for fever and fatigue.  HENT: Negative for congestion and drooling.   Eyes: Negative for pain.  Respiratory: Positive for cough, chest tightness and shortness of breath.   Cardiovascular: Negative for chest pain.  Gastrointestinal:  Negative for nausea, vomiting, abdominal pain and diarrhea.  Genitourinary: Negative for dysuria and hematuria.  Musculoskeletal: Negative for back pain, gait problem and neck pain.  Skin: Negative for color change.  Neurological: Negative for dizziness and headaches.  Hematological: Negative for adenopathy.  Psychiatric/Behavioral: Negative for behavioral problems.  All other systems reviewed and are negative.     Allergies  Penicillins  Home Medications   Prior to Admission medications   Medication Sig Start Date End Date Taking? Authorizing Provider  ibuprofen (ADVIL,MOTRIN) 200 MG tablet Take 400-800 mg by mouth every 6 (six) hours as needed for pain.     Historical Provider, MD  methotrexate (RHEUMATREX) 2.5 MG tablet Take 12.5 mg by mouth 2 (two) times a week. Thursdays and Fridays only; Caution:Chemotherapy. Protect from light.    Historical Provider, MD  methotrexate (RHEUMATREX) 2.5 MG tablet Take 5 tablets (12.5 mg total) by mouth 2 (two) times a week. Caution:Chemotherapy. Protect from light. 03/28/13   Kaitlyn Szekalski, PA-C  olmesartan-hydrochlorothiazide (BENICAR HCT) 40-12.5 MG per tablet Take 1 tablet by mouth daily.    Historical Provider, MD  predniSONE (DELTASONE) 10 MG tablet Take 0.5 tablets (5 mg total) by mouth daily. 03/28/13   Kaitlyn Szekalski, PA-C  predniSONE (DELTASONE) 5 MG tablet Take 5 mg by mouth daily with breakfast.    Historical Provider, MD  traMADol Veatrice Bourbon)  50 MG tablet Take 50 mg by mouth every 6 (six) hours as needed for moderate pain.    Historical Provider, MD  trolamine salicylate (ASPERCREME) 10 % cream Apply 1 application topically as needed for muscle pain.    Historical Provider, MD   BP 180/95  Pulse 89  Temp(Src) 99 F (37.2 C) (Oral)  Resp 20  Ht 5\' 1"  (1.549 m)  Wt 210 lb (95.255 kg)  BMI 39.70 kg/m2  SpO2 100% Physical Exam  Nursing note and vitals reviewed. Constitutional: She is oriented to person, place, and time. She  appears well-developed and well-nourished.  HENT:  Head: Normocephalic and atraumatic.  Mouth/Throat: Oropharynx is clear and moist. No oropharyngeal exudate.  Eyes: Conjunctivae and EOM are normal. Pupils are equal, round, and reactive to light.  Neck: Normal range of motion. Neck supple.  Cardiovascular: Normal rate, regular rhythm, normal heart sounds and intact distal pulses.  Exam reveals no gallop and no friction rub.   No murmur heard. Pulmonary/Chest: Effort normal and breath sounds normal. No respiratory distress. She has no wheezes. She exhibits tenderness (mild to mod ttp of anterior bilateral chest wall ).  Abdominal: Soft. Bowel sounds are normal. There is no tenderness. There is no rebound and no guarding.  Musculoskeletal: Normal range of motion. She exhibits no edema and no tenderness.  Chest pain reproduced w/ rotation of torso.   Neurological: She is alert and oriented to person, place, and time.  Skin: Skin is warm and dry.  Psychiatric: She has a normal mood and affect. Her behavior is normal.    ED Course  Procedures (including critical care time) Labs Review Labs Reviewed  CBC - Abnormal; Notable for the following:    Hemoglobin 9.4 (*)    HCT 30.4 (*)    MCV 72.6 (*)    MCH 22.4 (*)    RDW 16.2 (*)    All other components within normal limits  BASIC METABOLIC PANEL  I-STAT TROPOININ, ED    Imaging Review No results found.   EKG Interpretation   Date/Time:  Saturday November 22 2013 17:47:58 EDT Ventricular Rate:  86 PR Interval:  135 QRS Duration: 80 QT Interval:  377 QTC Calculation: 451 R Axis:   96 Text Interpretation:  Sinus arrhythmia Borderline right axis deviation No  significant change since last tracing Confirmed by Hazelgrace Bonham  MD, Tahja Liao  ([id number]) on 11/22/2013 7:56:06 PM      MDM   Final diagnoses:  Chest pain, atypical    8:10 PM 38 y.o. female w hx of HTN, FH heart dis (mother) who pw cp after cleaning yesterday. Pt began having  coughing episode after cleaning w/ bleach and ajax. Later that night developed chest tightness and mild sob. Sx are constant and unwavering since then. Pt hypertensive here, but currently off her BP meds d/t financial reasons. Will give a dose of hydralazine and get screening labs/imaging. Atypical given constant nature, reproducible w/ movement and palpation on exam and seems to be related to inhaling fumes from cleaning subs.   9:35 PM: I interpreted/reviewed the labs and/or imaging which were non-contributory.  Pain improved w/ a percocet. Will give pt a Rx for hctz as it is on the $4 list. Also percocet for pain. Do not think this is ACS, PE. Possibly pneumonitis vs msk pain from coughing exacerbation. Pt well appearing, CTAB and in no acute distress. Low risk for MACE per HEART score.  I have discussed the diagnosis/risks/treatment options with the patient and  believe the pt to be eligible for discharge home to follow-up with pcp as needed. We also discussed returning to the ED immediately if new or worsening sx occur. We discussed the sx which are most concerning (e.g., worsening cp, sob, fever) that necessitate immediate return. Medications administered to the patient during their visit and any new prescriptions provided to the patient are listed below.  Medications given during this visit Medications  oxyCODONE-acetaminophen (PERCOCET/ROXICET) 5-325 MG per tablet 1 tablet (1 tablet Oral Given 11/22/13 2032)  hydrALAZINE (APRESOLINE) injection 10 mg (10 mg Intravenous Given 11/22/13 2112)    Discharge Medication List as of 11/22/2013  9:38 PM    START taking these medications   Details  hydrochlorothiazide (MICROZIDE) 12.5 MG capsule Take 1 capsule (12.5 mg total) by mouth daily., Starting 11/22/2013, Until Discontinued, Print    oxyCODONE-acetaminophen (PERCOCET) 5-325 MG per tablet Take 1 tablet by mouth every 6 (six) hours as needed for moderate pain., Starting 11/22/2013, Until Discontinued, Print          Blanchard Kelch, MD 11/23/13 1026

## 2013-11-22 NOTE — ED Notes (Signed)
Per EMS, pt woke up from taking a nap and was short of breath, c/o chest pressure left chest. BP 185/110, P85, R18. NAD noted. Pt was cleaning house yesterday evening using AJAX cleaning powder and bleach and began to cough and get short of breath. Pt has h/o high blood pressure.

## 2013-12-03 ENCOUNTER — Encounter: Payer: Self-pay | Admitting: *Deleted

## 2013-12-03 NOTE — Progress Notes (Signed)
Pt no showed several ultrasound appointments.

## 2013-12-23 ENCOUNTER — Ambulatory Visit (HOSPITAL_COMMUNITY)
Admission: RE | Admit: 2013-12-23 | Discharge: 2013-12-23 | Disposition: A | Discharge status: Home / Self Care | Payer: Medicaid Other | Source: Ambulatory Visit | Attending: Obstetrics & Gynecology | Admitting: Obstetrics & Gynecology

## 2013-12-23 DIAGNOSIS — T8332XA Displacement of intrauterine contraceptive device, initial encounter: Secondary | ICD-10-CM

## 2013-12-23 DIAGNOSIS — D259 Leiomyoma of uterus, unspecified: Secondary | ICD-10-CM | POA: Insufficient documentation

## 2013-12-23 DIAGNOSIS — Z30431 Encounter for routine checking of intrauterine contraceptive device: Secondary | ICD-10-CM | POA: Insufficient documentation

## 2014-01-01 ENCOUNTER — Telehealth: Payer: Self-pay | Admitting: *Deleted

## 2014-01-01 NOTE — Telephone Encounter (Signed)
Michele Lang called wanting results of [place]. Informed her ultrasound did show IUD in correct location. Asked if she wanted IUd removed. She states she wants it removed because it has been in 5 years, but doesn't want a new one replaced yet, not sure about what she is going to do. Also wanted to know if removal of IUd and [place] would be covered by family planning medicaid that she has. Informed her I understood birth control only covered but best to call medicaid. Also informed her that registrar would call her with appointment for removal iud.

## 2014-03-06 ENCOUNTER — Ambulatory Visit (INDEPENDENT_AMBULATORY_CARE_PROVIDER_SITE_OTHER): Payer: Medicaid Other | Admitting: Obstetrics & Gynecology

## 2014-03-06 ENCOUNTER — Encounter: Payer: Self-pay | Admitting: Obstetrics & Gynecology

## 2014-03-06 VITALS — BP 144/89 | HR 85 | Temp 98.0°F | Ht 62.0 in | Wt 188.0 lb

## 2014-03-06 DIAGNOSIS — Z975 Presence of (intrauterine) contraceptive device: Secondary | ICD-10-CM

## 2014-03-06 NOTE — Patient Instructions (Signed)
Levonorgestrel intrauterine device (IUD) What is this medicine? LEVONORGESTREL IUD (LEE voe nor jes trel) is a contraceptive (birth control) device. The device is placed inside the uterus by a healthcare professional. It is used to prevent pregnancy and can also be used to treat heavy bleeding that occurs during your period. Depending on the device, it can be used for 3 to 5 years. This medicine may be used for other purposes; ask your health care provider or pharmacist if you have questions. COMMON BRAND NAME(S): LILETTA, Mirena, Skyla What should I tell my health care provider before I take this medicine? They need to know if you have any of these conditions: -abnormal Pap smear -cancer of the breast, uterus, or cervix -diabetes -endometritis -genital or pelvic infection now or in the past -have more than one sexual partner or your partner has more than one partner -heart disease -history of an ectopic or tubal pregnancy -immune system problems -IUD in place -liver disease or tumor -problems with blood clots or take blood-thinners -use intravenous drugs -uterus of unusual shape -vaginal bleeding that has not been explained -an unusual or allergic reaction to levonorgestrel, other hormones, silicone, or polyethylene, medicines, foods, dyes, or preservatives -pregnant or trying to get pregnant -breast-feeding How should I use this medicine? This device is placed inside the uterus by a health care professional. Talk to your pediatrician regarding the use of this medicine in children. Special care may be needed. Overdosage: If you think you have taken too much of this medicine contact a poison control center or emergency room at once. NOTE: This medicine is only for you. Do not share this medicine with others. What if I miss a dose? This does not apply. What may interact with this medicine? Do not take this medicine with any of the following  medications: -amprenavir -bosentan -fosamprenavir This medicine may also interact with the following medications: -aprepitant -barbiturate medicines for inducing sleep or treating seizures -bexarotene -griseofulvin -medicines to treat seizures like carbamazepine, ethotoin, felbamate, oxcarbazepine, phenytoin, topiramate -modafinil -pioglitazone -rifabutin -rifampin -rifapentine -some medicines to treat HIV infection like atazanavir, indinavir, lopinavir, nelfinavir, tipranavir, ritonavir -St. John's wort -warfarin This list may not describe all possible interactions. Give your health care provider a list of all the medicines, herbs, non-prescription drugs, or dietary supplements you use. Also tell them if you smoke, drink alcohol, or use illegal drugs. Some items may interact with your medicine. What should I watch for while using this medicine? Visit your doctor or health care professional for regular check ups. See your doctor if you or your partner has sexual contact with others, becomes HIV positive, or gets a sexual transmitted disease. This product does not protect you against HIV infection (AIDS) or other sexually transmitted diseases. You can check the placement of the IUD yourself by reaching up to the top of your vagina with clean fingers to feel the threads. Do not pull on the threads. It is a good habit to check placement after each menstrual period. Call your doctor right away if you feel more of the IUD than just the threads or if you cannot feel the threads at all. The IUD may come out by itself. You may become pregnant if the device comes out. If you notice that the IUD has come out use a backup birth control method like condoms and call your health care provider. Using tampons will not change the position of the IUD and are okay to use during your period. What side effects may   I notice from receiving this medicine? Side effects that you should report to your doctor or  health care professional as soon as possible: -allergic reactions like skin rash, itching or hives, swelling of the face, lips, or tongue -fever, flu-like symptoms -genital sores -high blood pressure -no menstrual period for 6 weeks during use -pain, swelling, warmth in the leg -pelvic pain or tenderness -severe or sudden headache -signs of pregnancy -stomach cramping -sudden shortness of breath -trouble with balance, talking, or walking -unusual vaginal bleeding, discharge -yellowing of the eyes or skin Side effects that usually do not require medical attention (report to your doctor or health care professional if they continue or are bothersome): -acne -breast pain -change in sex drive or performance -changes in weight -cramping, dizziness, or faintness while the device is being inserted -headache -irregular menstrual bleeding within first 3 to 6 months of use -nausea This list may not describe all possible side effects. Call your doctor for medical advice about side effects. You may report side effects to FDA at 1-800-FDA-1088. Where should I keep my medicine? This does not apply. NOTE: This sheet is a summary. It may not cover all possible information. If you have questions about this medicine, talk to your doctor, pharmacist, or health care provider.  2015, Elsevier/Gold Standard. (2011-06-08 13:54:04)  

## 2014-03-06 NOTE — Progress Notes (Signed)
Patient ID: Michele Lang, female   DOB: 1975/12/31, 39 y.o.   MRN: [id number] No LMP recorded. Patient is not currently having periods (Reason: IUD). Mirena since 10/2008 need removal and probable replacement bur has no insurance, will inquire at [facility] to have service done there for less cost  Woodroe Mode, MD 03/06/2014

## 2014-03-10 ENCOUNTER — Ambulatory Visit: Payer: Medicaid Other | Admitting: Obstetrics & Gynecology

## 2014-03-12 ENCOUNTER — Ambulatory Visit (INDEPENDENT_AMBULATORY_CARE_PROVIDER_SITE_OTHER): Payer: Medicaid Other | Admitting: Obstetrics & Gynecology

## 2014-03-12 ENCOUNTER — Encounter: Payer: Self-pay | Admitting: Obstetrics & Gynecology

## 2014-03-12 VITALS — BP 127/70 | HR 108 | Temp 98.2°F | Ht 62.0 in | Wt 188.0 lb

## 2014-03-12 DIAGNOSIS — Z30014 Encounter for initial prescription of intrauterine contraceptive device: Secondary | ICD-10-CM

## 2014-03-12 DIAGNOSIS — Z30432 Encounter for removal of intrauterine contraceptive device: Secondary | ICD-10-CM

## 2014-03-12 DIAGNOSIS — Z30431 Encounter for routine checking of intrauterine contraceptive device: Secondary | ICD-10-CM

## 2014-03-12 MED ORDER — NORGESTIMATE-ETH ESTRADIOL 0.25-35 MG-MCG PO TABS: 1.0000 | ORAL_TABLET | Freq: Every day | ORAL | Status: DC

## 2014-03-12 NOTE — Progress Notes (Signed)
    GYNECOLOGY CLINIC PROCEDURE NOTE  Michele Lang is a 38 y.o. [phone] here for Mirena IUD removal. No GYN concerns.  Last pap smear was on August 2015 at [facility] and was normal.  IUD Removal  Patient was in the dorsal lithotomy position, normal external genitalia was noted.  A speculum was placed in the patient's vagina, normal discharge was noted, no lesions. The multiparous cervix was visualized, no lesions, no abnormal discharge.  The strings of the IUD were not visualized, so Kelly forceps were introduced into the cervical canal and the IUD was grasped and removed in its entirety.  Patient tolerated the procedure well.    Patient will use OCPs for contraception, Sprintec prescribed, instructed to start today and use back up contraception for one week.  Routine preventative health maintenance measures emphasized.  Mammogram scholarship application given to patient.   Verita Schneiders, MD, [place] Attending [place] for [employer], [place]

## 2014-03-12 NOTE — Patient Instructions (Signed)
Return to clinic for any scheduled appointments or for any gynecologic concerns as needed.   

## 2014-03-23 ENCOUNTER — Encounter: Payer: Self-pay | Admitting: Obstetrics & Gynecology

## 2014-05-07 ENCOUNTER — Encounter: Payer: Self-pay | Admitting: *Deleted

## 2014-09-24 ENCOUNTER — Encounter (HOSPITAL_COMMUNITY): Payer: Self-pay | Admitting: Emergency Medicine

## 2014-09-24 ENCOUNTER — Emergency Department (HOSPITAL_COMMUNITY)
Admission: EM | Admit: 2014-09-24 | Discharge: 2014-09-24 | Disposition: A | Discharge status: Home / Self Care | Payer: Medicaid Other | Attending: Emergency Medicine | Admitting: Emergency Medicine

## 2014-09-24 ENCOUNTER — Emergency Department (HOSPITAL_COMMUNITY): Payer: Medicaid Other

## 2014-09-24 DIAGNOSIS — Z79899 Other long term (current) drug therapy: Secondary | ICD-10-CM | POA: Diagnosis not present

## 2014-09-24 DIAGNOSIS — Z880 Allergy status to penicillin: Secondary | ICD-10-CM | POA: Diagnosis not present

## 2014-09-24 DIAGNOSIS — D509 Iron deficiency anemia, unspecified: Secondary | ICD-10-CM | POA: Diagnosis not present

## 2014-09-24 DIAGNOSIS — I10 Essential (primary) hypertension: Secondary | ICD-10-CM | POA: Diagnosis not present

## 2014-09-24 DIAGNOSIS — R0602 Shortness of breath: Secondary | ICD-10-CM | POA: Insufficient documentation

## 2014-09-24 DIAGNOSIS — M1990 Unspecified osteoarthritis, unspecified site: Secondary | ICD-10-CM | POA: Diagnosis not present

## 2014-09-24 DIAGNOSIS — D649 Anemia, unspecified: Secondary | ICD-10-CM

## 2014-09-24 DIAGNOSIS — R079 Chest pain, unspecified: Secondary | ICD-10-CM | POA: Diagnosis present

## 2014-09-24 LAB — CBC WITH DIFFERENTIAL/PLATELET
BASOS ABS: 0 10*3/uL (ref 0.0–0.1)
Basophils Relative: 0 % (ref 0–1)
Eosinophils Absolute: 0.1 10*3/uL (ref 0.0–0.7)
Eosinophils Relative: 2 % (ref 0–5)
HEMATOCRIT: 31 % — AB (ref 36.0–46.0)
Hemoglobin: 9.7 g/dL — ABNORMAL LOW (ref 12.0–15.0)
Lymphocytes Relative: 27 % (ref 12–46)
Lymphs Abs: 1.9 10*3/uL (ref 0.7–4.0)
MCH: 23 pg — ABNORMAL LOW (ref 26.0–34.0)
MCHC: 31.3 g/dL (ref 30.0–36.0)
MCV: 73.6 fL — AB (ref 78.0–100.0)
Monocytes Absolute: 0.5 10*3/uL (ref 0.1–1.0)
Monocytes Relative: 7 % (ref 3–12)
NEUTROS PCT: 63 % (ref 43–77)
Neutro Abs: 4.3 10*3/uL (ref 1.7–7.7)
Platelets: 324 10*3/uL (ref 150–400)
RBC: 4.21 MIL/uL (ref 3.87–5.11)
RDW: 15.6 % — ABNORMAL HIGH (ref 11.5–15.5)
WBC: 6.9 10*3/uL (ref 4.0–10.5)

## 2014-09-24 LAB — BASIC METABOLIC PANEL
Anion gap: 8 (ref 5–15)
BUN: 12 mg/dL (ref 6–20)
CHLORIDE: 104 mmol/L (ref 101–111)
CO2: 25 mmol/L (ref 22–32)
CREATININE: 0.63 mg/dL (ref 0.44–1.00)
Calcium: 9.1 mg/dL (ref 8.9–10.3)
GFR calc Af Amer: >60 mL/min (ref >60)
GFR calc non Af Amer: >60 mL/min (ref >60)
Glucose, Bld: 95 mg/dL (ref 70–99)
Potassium: 3.8 mmol/L (ref 3.5–5.1)
Sodium: 137 mmol/L (ref 135–145)

## 2014-09-24 LAB — I-STAT TROPONIN, ED: Troponin i, poc: 0 ng/mL (ref 0.00–0.08)

## 2014-09-24 MED ORDER — ASPIRIN 81 MG PO CHEW
324.0000 mg | CHEWABLE_TABLET | Freq: Once | ORAL | Status: AC
  Administered 2014-09-24: 324 mg via ORAL
  Filled 2014-09-24: qty 4

## 2014-09-24 NOTE — Discharge Instructions (Signed)
Chest Pain (Nonspecific) Take tylenol for pain.  Return for worsening symptoms. Follow up with your primary care provider.  It is often hard to give a specific diagnosis for the cause of chest pain. There is always a chance that your pain could be related to something serious, such as a heart attack or a blood clot in the lungs. You need to follow up with your health care provider for further evaluation. CAUSES   Heartburn.  Pneumonia or bronchitis.  Anxiety or stress.  Inflammation around your heart (pericarditis) or lung (pleuritis or pleurisy).  A blood clot in the lung.  A collapsed lung (pneumothorax). It can develop suddenly on its own (spontaneous pneumothorax) or from trauma to the chest.  Shingles infection (herpes zoster virus). The chest wall is composed of bones, muscles, and cartilage. Any of these can be the source of the pain.  The bones can be bruised by injury.  The muscles or cartilage can be strained by coughing or overwork.  The cartilage can be affected by inflammation and become sore (costochondritis). DIAGNOSIS  Lab tests or other studies may be needed to find the cause of your pain. Your health care provider may have you take a test called an ambulatory electrocardiogram (ECG). An ECG records your heartbeat patterns over a 24-hour period. You may also have other tests, such as:  Transthoracic echocardiogram (TTE). During echocardiography, sound waves are used to evaluate how blood flows through your heart.  Transesophageal echocardiogram (TEE).  Cardiac monitoring. This allows your health care provider to monitor your heart rate and rhythm in real time.  Holter monitor. This is a portable device that records your heartbeat and can help diagnose heart arrhythmias. It allows your health care provider to track your heart activity for several days, if needed.  Stress tests by exercise or by giving medicine that makes the heart beat faster. TREATMENT    Treatment depends on what may be causing your chest pain. Treatment may include:  Acid blockers for heartburn.  Anti-inflammatory medicine.  Pain medicine for inflammatory conditions.  Antibiotics if an infection is present.  You may be advised to change lifestyle habits. This includes stopping smoking and avoiding alcohol, caffeine, and chocolate.  You may be advised to keep your head raised (elevated) when sleeping. This reduces the chance of acid going backward from your stomach into your esophagus. Most of the time, nonspecific chest pain will improve within 2-3 days with rest and mild pain medicine.  HOME CARE INSTRUCTIONS   If antibiotics were prescribed, take them as directed. Finish them even if you start to feel better.  For the next few days, avoid physical activities that bring on chest pain. Continue physical activities as directed.  Do not use any tobacco products, including cigarettes, chewing tobacco, or electronic cigarettes.  Avoid drinking alcohol.  Only take medicine as directed by your health care provider.  Follow your health care provider's suggestions for further testing if your chest pain does not go away.  Keep any follow-up appointments you made. If you do not go to an appointment, you could develop lasting (chronic) problems with pain. If there is any problem keeping an appointment, call to reschedule. SEEK MEDICAL CARE IF:   Your chest pain does not go away, even after treatment.  You have a rash with blisters on your chest.  You have a fever. SEEK IMMEDIATE MEDICAL CARE IF:   You have increased chest pain or pain that spreads to your arm, neck, jaw, back,  or abdomen.  You have shortness of breath.  You have an increasing cough, or you cough up blood.  You have severe back or abdominal pain.  You feel nauseous or vomit.  You have severe weakness.  You faint.  You have chills. This is an emergency. Do not wait to see if the pain will  go away. Get medical help at once. Call your local emergency services (911 in [place] Do not drive yourself to the hospital. MAKE SURE YOU:   Understand these instructions.  Will watch your condition.  Will get help right away if you are not doing well or get worse. Document Released: 02/15/2005 Document Revised: 05/13/2013 Document Reviewed: 12/12/2007 [facility] Patient Information 2015 [place], [place]. This information is not intended to replace advice given to you by your health care provider. Make sure you discuss any questions you have with your health care provider.

## 2014-09-24 NOTE — ED Provider Notes (Signed)
CSN: [id number]     Arrival date & time 09/24/14  1044 History   First MD Initiated Contact with Patient 09/24/14 1104     Chief Complaint  Patient presents with  . Chest Pain     (Consider location/radiation/quality/duration/timing/severity/associated sxs/prior Treatment) Patient is a 39 y.o. female presenting with chest pain. The history is provided by the patient. No language interpreter was used.  Chest Pain Associated symptoms: no dizziness   Michele Lang is a 39 y.o female with a history of HTN and arthritis who presents for chest pain that began 2 days ago.  She describes it as constant tightness "like I pulled a muscle."  She states she has had some shortness of breath with it.  She took ibuprofen for pain yesterday. She states she has had this pain in the past and it resolved on its own.  She is not a smoker.  Her mother has CHF. She denies any fever, cough, abdominal pain, nausea, vomiting, leg swelling or recent travel.   Past Medical History  Diagnosis Date  . Hypertension   . Arthritis    Past Surgical History  Procedure Laterality Date  . Breast surgery    . Foot surgery     Family History  Problem Relation Age of Onset  . Hypertension Mother   . Heart disease Mother   . Hypertension Father   . Hypertension Sister   . Depression Maternal Uncle   . Depression Maternal Grandmother   . Asthma Son   . Allergies Son   . Rheum arthritis Maternal Grandmother    History  Substance Use Topics  . Smoking status: Never Smoker   . Smokeless tobacco: Not on file  . Alcohol Use: No   OB History    Gravida Para Term Preterm AB TAB SAB Ectopic Multiple Living   4 3 3  0 1 0 1 0 0 3     Review of Systems  Cardiovascular: Positive for chest pain.  Neurological: Negative for dizziness and syncope.  All other systems reviewed and are negative.     Allergies  Penicillins  Home Medications   Prior to Admission medications   Medication Sig Start Date End Date  Taking? Authorizing Provider  ibuprofen (ADVIL,MOTRIN) 200 MG tablet Take 400 mg by mouth every 6 (six) hours as needed for pain.     Historical Provider, MD  losartan-hydrochlorothiazide (HYZAAR) 100-25 MG per tablet Take 1 tablet by mouth daily.    Historical Provider, MD  naproxen sodium (ANAPROX) 220 MG tablet Take 220 mg by mouth daily as needed (for pain).    Historical Provider, MD  norgestimate-ethinyl estradiol (ORTHO-CYCLEN,SPRINTEC,PREVIFEM) 0.25-35 MG-MCG tablet Take 1 tablet by mouth daily. Patient not taking: Reported on 09/24/2014 03/12/14   Osborne Oman, MD  oxyCODONE-acetaminophen (PERCOCET) 5-325 MG per tablet Take 1 tablet by mouth every 6 (six) hours as needed for moderate pain. Patient not taking: Reported on 09/24/2014 11/22/13   Pamella Pert, MD   BP 152/96 mmHg  Pulse 80  Temp(Src) 98.3 F (36.8 C) (Oral)  Resp 21  Ht 5\' 2"  (1.575 m)  Wt 190 lb (86.183 kg)  BMI 34.74 kg/m2  SpO2 100%  LMP 08/18/2014 Physical Exam  Constitutional: She is oriented to person, place, and time. She appears well-developed and well-nourished.  HENT:  Head: Normocephalic and atraumatic.  Eyes: Conjunctivae are normal.  Neck: Normal range of motion. Neck supple.  Cardiovascular: Normal rate and regular rhythm.   Pulmonary/Chest: Effort normal and breath sounds  normal. No respiratory distress. She has no wheezes. She exhibits tenderness.  Mid sternal chest tenderness with palpation.   Abdominal: Soft. There is no tenderness.  Musculoskeletal: Normal range of motion. She exhibits no edema.  Neurological: She is alert and oriented to person, place, and time.  Skin: Skin is warm and dry.    ED Course  Procedures (including critical care time) Labs Review Labs Reviewed  CBC WITH DIFFERENTIAL/PLATELET - Abnormal; Notable for the following:    Hemoglobin 9.7 (*)    HCT 31.0 (*)    MCV 73.6 (*)    MCH 23.0 (*)    RDW 15.6 (*)    All other components within normal limits  BASIC  METABOLIC PANEL  I-STAT TROPOININ, ED    Imaging Review Dg Chest 2 View  09/24/2014   CLINICAL DATA:  Chest pain and difficulty breathing for 2 days  EXAM: CHEST  2 VIEW  COMPARISON:  November 22, 2013  FINDINGS: There is no edema or consolidation. Heart is upper normal in size with pulmonary vascularity within normal limits. No adenopathy. No bone lesions.  IMPRESSION: No edema or consolidation.   Electronically Signed   By: Lowella Grip III M.D.   On: 09/24/2014 11:54   EKG interpretation 09/24/14 10:50 Vent. rate 72 BPM PR interval 138 ms QRS duration 80 ms QT/QTc 382/418 ms P-R-T axes 39 93 15 Normal sinus rhythm. I have seen and agree with the EKG interpretation.  MDM   Final diagnoses:  Chest pain, unspecified chest pain type  Anemia, unspecified anemia type  Patient presents for chest pain that she describes as a tightness, "like a pulled muscle," for the past 2 days. CBC and BMP are not concerning.  Her troponin is negative and EKG is normal. Her CXR is negative for edema and pneumonia.  I reviewed the heart score and she is at low risk for major cardiac event.  She is PERC negative.  I spoke to patient regarding anemia.  She states she has not been taking her iron supplementation and I did tell her to resume the medication. She can take tylenol or motrin for pain. She will follow up with her pcp and agrees with the plan.      Ottie Glazier, PA-C 09/24/14 [place], MD 09/25/14 1012

## 2014-09-24 NOTE — ED Notes (Signed)
Patient transported to X-ray 

## 2014-09-24 NOTE — ED Notes (Signed)
Pt reports been out of BP med for over a month, cannot afford.  Also c/o shortness of breath at times, pain to left chest increases with movement.

## 2014-09-24 NOTE — ED Notes (Signed)
Returned from [employer]

## 2014-12-23 ENCOUNTER — Other Ambulatory Visit (HOSPITAL_COMMUNITY)
Admission: RE | Admit: 2014-12-23 | Discharge: 2014-12-23 | Disposition: A | Discharge status: Home / Self Care | Payer: Medicaid Other | Source: Ambulatory Visit | Attending: Family Medicine | Admitting: Family Medicine

## 2014-12-23 ENCOUNTER — Other Ambulatory Visit: Payer: Medicaid Other | Admitting: Family Medicine

## 2014-12-23 DIAGNOSIS — Z113 Encounter for screening for infections with a predominantly sexual mode of transmission: Secondary | ICD-10-CM | POA: Diagnosis not present

## 2014-12-23 DIAGNOSIS — N760 Acute vaginitis: Secondary | ICD-10-CM | POA: Diagnosis present

## 2014-12-25 LAB — URINE CYTOLOGY ANCILLARY ONLY
BACTERIAL VAGINITIS: NEGATIVE
CANDIDA VAGINITIS: NEGATIVE
CHLAMYDIA, DNA PROBE: NEGATIVE
Neisseria Gonorrhea: NEGATIVE
TRICH (WINDOWPATH): NEGATIVE

## 2015-05-12 DIAGNOSIS — M0579 Rheumatoid arthritis with rheumatoid factor of multiple sites without organ or systems involvement: Secondary | ICD-10-CM | POA: Insufficient documentation

## 2015-05-12 DIAGNOSIS — Z79899 Other long term (current) drug therapy: Secondary | ICD-10-CM

## 2015-05-12 HISTORY — DX: Other long term (current) drug therapy: Z79.899

## 2015-12-20 ENCOUNTER — Ambulatory Visit: Payer: Medicaid Other | Admitting: Physical Therapy

## 2015-12-27 ENCOUNTER — Encounter: Payer: Self-pay | Admitting: Physical Therapy

## 2015-12-27 ENCOUNTER — Ambulatory Visit: Payer: Medicaid Other | Attending: Family Medicine | Admitting: Physical Therapy

## 2015-12-27 DIAGNOSIS — R2689 Other abnormalities of gait and mobility: Secondary | ICD-10-CM

## 2015-12-27 DIAGNOSIS — M6281 Muscle weakness (generalized): Secondary | ICD-10-CM

## 2015-12-27 DIAGNOSIS — M25661 Stiffness of right knee, not elsewhere classified: Secondary | ICD-10-CM | POA: Diagnosis present

## 2015-12-27 DIAGNOSIS — M25561 Pain in right knee: Secondary | ICD-10-CM

## 2015-12-27 NOTE — Therapy (Signed)
[place], [place], [address] Phone: [phone]   Fax:  [phone]  Physical Therapy Evaluation  Patient Details  Name: Michele Lang MRN: [id number] Date of Birth: [birth year] Referring Provider: Rhina Brackett MD  Encounter Date: 12/27/2015      PT End of Session - 12/27/15 1151    Visit Number 1   Number of Visits 1   Date for PT Re-Evaluation 12/28/15   Authorization Type Medicaid   PT Start Time 1100   PT Stop Time 1145   PT Time Calculation (min) 45 min   Activity Tolerance Patient tolerated treatment well   Behavior During Therapy [facility] for tasks assessed/performed      Past Medical History:  Diagnosis Date  . Anxiety   . Arthritis   . Hypertension     Past Surgical History:  Procedure Laterality Date  . BREAST SURGERY    . FOOT SURGERY      There were no vitals filed for this visit.       Subjective Assessment - 12/27/15 1113    Subjective pt is 40 y.o F with CC of R knee pain that started gradually since 2013, reported waking up one morning having pain. reports pain stays in the knee denies an referral down the leg, and denies N/T. reports fluctuating swelling depending on activity. currenlty uses SPC most of the time. reports pain seems to continually get worse.    Limitations House hold activities;Walking;Standing;Lifting;Sitting  stairs   How long can you sit comfortably? 30-45 min   How long can you stand comfortably? 20-25 min   How long can you walk comfortably? 20 -25 min   Diagnostic tests x-ray last month    Patient Stated Goals to decrease pain, improve standing/ walking   Currently in Pain? Yes   Pain Score 4   took prednisone this AM   Pain Location Knee   Pain Orientation Right   Pain Type Chronic pain   Pain Onset More than a month ago   Pain Frequency Constant   Aggravating Factors  bending the knee, straigthening the leg, prolonged standing,     Pain Relieving Factors sitting/ relaxing, heating pad, muscle rub            [facility] PT Assessment - 12/27/15 1119      Assessment   Medical Diagnosis R knee pain   Referring Provider Rhina Brackett MD   Onset Date/Surgical Date --  since 2013   Hand Dominance Right   Next MD Visit --  2 months   Prior Therapy yes     Precautions   Precautions None     Restrictions   Weight Bearing Restrictions No     Balance Screen   Has the patient fallen in the past 6 months No   Has the patient had a decrease in activity level because of a fear of falling?  No   Is the patient reluctant to leave their home because of a fear of falling?  No     Home Environment   Living Environment Private residence   Living Arrangements Other relatives   Available Help at Discharge Available PRN/intermittently   Type of Home Apartment   Home Access Level entry;Stairs to enter   Entrance Stairs-Number of Steps 3   Entrance Stairs-Rails Cannot reach both   [place] One level   [place] - single point     Prior Function   Level of Independence  Independent;Independent with basic ADLs   Vocation Unemployed  working on disability   Leisure walking, exercise, reading, arts and crafts     Cognition   Overall Cognitive Status Within Functional Limits for tasks assessed     Posture/Postural Control   Posture/Postural Control Postural limitations   Postural Limitations Rounded Shoulders;Forward head;Weight shift left     ROM / Strength   AROM / PROM / Strength AROM;PROM;Strength     AROM   AROM Assessment Site Knee   Right/Left Knee Right;Left   Right Knee Extension 29  ERP   Right Knee Flexion 90  soreness at end range     PROM   PROM Assessment Site Knee   Right/Left Knee Right   Right Knee Extension 27  ERP   Right Knee Flexion 102  ERP     Strength   Strength Assessment Site Knee;Hip   Right/Left Hip Right;Left   Right Hip Flexion 3+/5   Right Hip Extension 3+/5    Right Hip ABduction 3+/5   Right Hip ADduction 4-/5   Left Hip Flexion 4-/5   Left Hip Extension 3+/5   Left Hip ABduction 3+/5   Left Hip ADduction 4/5   Right/Left Knee Right;Left   Right Knee Flexion 3/5   Right Knee Extension 4/5  pain during testing   Left Knee Flexion 4+/5   Left Knee Extension 4+/5     Palpation   Patella mobility hypomobility of the R patella in all planes   Palpation comment pain in the lateral joint line, and a tthe lateral / medial poles of the patellla     Ambulation/Gait   Assistive device Straight cane   Gait Pattern Decreased stance time - left;Decreased step length - right;Step-through pattern;Antalgic;Trendelenburg;Trunk flexed;Decreased trunk rotation;Lateral trunk lean to left;Wide base of support;Decreased weight shift to right;Decreased stride length                           PT Education - 12/27/15 1257    Education provided Yes   Education Details evaluation findings, anatomy of the tibiofemoral joint and patella and mechanics as well as effects of quadriceps of patella. HEP form and treatment rationale, [facility] handout.    Person(s) Educated Patient   Methods Explanation;Verbal cues   Comprehension Verbalized understanding;Verbal cues required                    Plan - 12/27/15 1258    Clinical Impression Statement Mrs. Goettel presents to [facility] as a low complexity evaluation with CC of chronic knee pain thats been present since 2013. She demonstrates limited R knee AROM/ PROM. weakness compared bil in the R knee and hip. pain inthe lateral tibiofemoral joint line and lateral pole of the patella. provided HEP handout and gave pt information regarding [facility].    PT Frequency One time visit   PT Next Visit Plan Medicaid 1 x evaluation   PT Home Exercise Plan see pt instruction   Consulted and Agree with Plan of Care Patient      Patient will benefit from skilled therapeutic intervention in  order to improve the following deficits and impairments:  Abnormal gait, Pain, Improper body mechanics, Postural dysfunction, Decreased strength, Increased edema, Hypomobility, Difficulty walking, Decreased range of motion, Decreased activity tolerance, Decreased balance, Decreased endurance, Increased fascial restricitons  Visit Diagnosis: Pain in right knee  Stiffness of right knee, not elsewhere classified  Muscle weakness (generalized)  Other  abnormalities of gait and mobility     Problem List Patient Active Problem List   Diagnosis Date Noted  . Chest pain, atypical 11/22/2013  . Essential hypertension, benign 01/04/2013  . Cough 01/04/2013  . Pleural effusion 01/03/2013   Starr Lake PT, DPT, LAT, ATC  12/27/15  1:03 PM      [facility] [address] [place], [place], [address] Phone: [phone]   Fax:  [phone]  Name: SHENIQUE HARJU MRN: [id number] Date of Birth: 12-31-1975

## 2016-02-09 ENCOUNTER — Encounter (HOSPITAL_COMMUNITY): Payer: Self-pay | Admitting: Emergency Medicine

## 2016-02-09 ENCOUNTER — Emergency Department (HOSPITAL_COMMUNITY): Payer: Medicaid Other

## 2016-02-09 ENCOUNTER — Emergency Department (HOSPITAL_COMMUNITY)
Admission: EM | Admit: 2016-02-09 | Discharge: 2016-02-09 | Disposition: A | Discharge status: Home / Self Care | Payer: Medicaid Other | Source: Home / Self Care

## 2016-02-09 DIAGNOSIS — R072 Precordial pain: Secondary | ICD-10-CM | POA: Insufficient documentation

## 2016-02-09 DIAGNOSIS — J189 Pneumonia, unspecified organism: Secondary | ICD-10-CM | POA: Diagnosis not present

## 2016-02-09 DIAGNOSIS — I10 Essential (primary) hypertension: Secondary | ICD-10-CM | POA: Insufficient documentation

## 2016-02-09 DIAGNOSIS — Z791 Long term (current) use of non-steroidal anti-inflammatories (NSAID): Secondary | ICD-10-CM | POA: Diagnosis not present

## 2016-02-09 DIAGNOSIS — Z5321 Procedure and treatment not carried out due to patient leaving prior to being seen by health care provider: Secondary | ICD-10-CM

## 2016-02-09 DIAGNOSIS — M069 Rheumatoid arthritis, unspecified: Secondary | ICD-10-CM | POA: Insufficient documentation

## 2016-02-09 DIAGNOSIS — Z79899 Other long term (current) drug therapy: Secondary | ICD-10-CM | POA: Insufficient documentation

## 2016-02-09 LAB — CBC
HCT: 32.9 % — ABNORMAL LOW (ref 36.0–46.0)
HEMOGLOBIN: 9.9 g/dL — AB (ref 12.0–15.0)
MCH: 23.9 pg — ABNORMAL LOW (ref 26.0–34.0)
MCHC: 30.1 g/dL (ref 30.0–36.0)
MCV: 79.5 fL (ref 78.0–100.0)
Platelets: 369 10*3/uL (ref 150–400)
RBC: 4.14 MIL/uL (ref 3.87–5.11)
RDW: 15.6 % — AB (ref 11.5–15.5)
WBC: 7 10*3/uL (ref 4.0–10.5)

## 2016-02-09 LAB — BASIC METABOLIC PANEL
ANION GAP: 8 (ref 5–15)
BUN: 11 mg/dL (ref 6–20)
CO2: 29 mmol/L (ref 22–32)
Calcium: 9.1 mg/dL (ref 8.9–10.3)
Chloride: 101 mmol/L (ref 101–111)
Creatinine, Ser: 0.87 mg/dL (ref 0.44–1.00)
Glucose, Bld: 94 mg/dL (ref 65–99)
Potassium: 3 mmol/L — ABNORMAL LOW (ref 3.5–5.1)
SODIUM: 138 mmol/L (ref 135–145)

## 2016-02-09 LAB — I-STAT TROPONIN, ED: Troponin i, poc: 0 ng/mL (ref 0.00–0.08)

## 2016-02-09 LAB — I-STAT BETA HCG BLOOD, ED (MC, WL, AP ONLY): I-stat hCG, quantitative: <5 m[IU]/mL (ref <5)

## 2016-02-09 NOTE — ED Triage Notes (Signed)
Pt states she has had midsternal chest pain x 1 week that worsened tonight. Alert and oriented.

## 2016-02-09 NOTE — ED Triage Notes (Signed)
Pt sts mid sternal to left sided CP worse with inspiration x 1 week

## 2016-02-09 NOTE — ED Notes (Signed)
Pt stated she was going to go home and lay down. Pt lwbs

## 2016-02-10 ENCOUNTER — Encounter (HOSPITAL_COMMUNITY): Payer: Self-pay

## 2016-02-10 ENCOUNTER — Emergency Department (HOSPITAL_COMMUNITY)
Admission: EM | Admit: 2016-02-10 | Discharge: 2016-02-10 | Disposition: A | Discharge status: Home / Self Care | Payer: Medicaid Other | Attending: Emergency Medicine | Admitting: Emergency Medicine

## 2016-02-10 ENCOUNTER — Emergency Department (HOSPITAL_COMMUNITY): Payer: Medicaid Other

## 2016-02-10 DIAGNOSIS — M069 Rheumatoid arthritis, unspecified: Secondary | ICD-10-CM

## 2016-02-10 DIAGNOSIS — J189 Pneumonia, unspecified organism: Secondary | ICD-10-CM

## 2016-02-10 MED ORDER — METHOCARBAMOL 500 MG PO TABS: 500.0000 mg | ORAL_TABLET | Freq: Two times a day (BID) | ORAL | 0 refills | Status: DC

## 2016-02-10 MED ORDER — LIDOCAINE 5 % EX PTCH
2.0000 | MEDICATED_PATCH | CUTANEOUS | Status: DC
  Administered 2016-02-10: 2 via TRANSDERMAL
  Filled 2016-02-10: qty 2

## 2016-02-10 MED ORDER — AZITHROMYCIN 250 MG PO TABS
500.0000 mg | ORAL_TABLET | Freq: Once | ORAL | Status: AC
  Administered 2016-02-10: 500 mg via ORAL
  Filled 2016-02-10: qty 2

## 2016-02-10 MED ORDER — PREDNISONE 20 MG PO TABS: ORAL_TABLET | ORAL | 0 refills | Status: DC

## 2016-02-10 MED ORDER — IOPAMIDOL (ISOVUE-370) INJECTION 76%
100.0000 mL | Freq: Once | INTRAVENOUS | Status: AC | PRN
  Administered 2016-02-10: 100 mL via INTRAVENOUS

## 2016-02-10 MED ORDER — MELOXICAM 15 MG PO TABS: 15.0000 mg | ORAL_TABLET | Freq: Every day | ORAL | 0 refills | Status: DC

## 2016-02-10 MED ORDER — METHOCARBAMOL 500 MG PO TABS
1000.0000 mg | ORAL_TABLET | Freq: Once | ORAL | Status: AC
  Administered 2016-02-10: 1000 mg via ORAL
  Filled 2016-02-10: qty 2

## 2016-02-10 MED ORDER — METHYLPREDNISOLONE SODIUM SUCC 125 MG IJ SOLR
125.0000 mg | Freq: Once | INTRAMUSCULAR | Status: AC
  Administered 2016-02-10: 125 mg via INTRAVENOUS
  Filled 2016-02-10: qty 2

## 2016-02-10 MED ORDER — KETOROLAC TROMETHAMINE 30 MG/ML IJ SOLN
30.0000 mg | Freq: Once | INTRAMUSCULAR | Status: AC
  Administered 2016-02-10: 30 mg via INTRAVENOUS
  Filled 2016-02-10: qty 1

## 2016-02-10 MED ORDER — AZITHROMYCIN 250 MG PO TABS: ORAL_TABLET | ORAL | 0 refills | Status: DC

## 2016-02-10 NOTE — ED Provider Notes (Addendum)
[facility] DEPT Provider Note   CSN: [id number] Arrival date & time: 02/09/16  2217  By signing my name below, I, Maud Deed. Royston Sinner, attest that this documentation has been prepared under the direction and in the presence of Austan Nicholl, MD.  Electronically Signed: Maud Deed. Royston Sinner, ED Scribe. 02/10/16. 2:52 AM.    History   Chief Complaint Chief Complaint  Patient presents with  . Chest Pain   The history is provided by the patient. No language interpreter was used.  Chest Pain   This is a new problem. The current episode started more than 2 days ago. The problem has not changed since onset.The pain is associated with coughing. The pain is present in the substernal region. The quality of the pain is described as pressure-like. The pain does not radiate. Duration of episode(s) is 1 week. Associated symptoms include cough and shortness of breath. Pertinent negatives include no back pain, no diaphoresis and no fever. She has tried nothing for the symptoms.  Her past medical history is significant for anxiety/panic attacks and hypertension.    HPI Comments: Michele Lang is a 40 y.o. female with a PMHx of anxiety, arthritis, and HTN who presents to the Emergency Department complaining of intermittent, worsening substernal chest pain x 1 week. Pain is described as pressure. Discomfort is made worse with coughing. No alleviating factors reported. She also reports generalized aching to joints along related to RA which are not migratory with mild shortness of breath and cough No OTC medications or home remedies attempted prior to arrival. No recent fever or chills. No recent long distance travel. Most recent Flu vaccination 2 months ago.  PCP: Angelica Chessman, MD    Past Medical History:  Diagnosis Date  . Anxiety   . Arthritis   . Hypertension     Patient Active Problem List   Diagnosis Date Noted  . Chest pain, atypical 11/22/2013  . Essential hypertension, benign  01/04/2013  . Cough 01/04/2013  . Pleural effusion 01/03/2013    Past Surgical History:  Procedure Laterality Date  . BREAST SURGERY    . FOOT SURGERY      OB History    Gravida Para Term Preterm AB Living   4 3 3  0 1 3   SAB TAB Ectopic Multiple Live Births   1 0 0 0         Home Medications    Prior to Admission medications   Medication Sig Start Date End Date Taking? Authorizing Provider  folic acid (FOLVITE) 1 MG tablet Take 1 mg by mouth daily.    Historical Provider, MD  ibuprofen (ADVIL,MOTRIN) 200 MG tablet Take 400 mg by mouth every 6 (six) hours as needed for pain.     Historical Provider, MD  losartan-hydrochlorothiazide (HYZAAR) 100-25 MG per tablet Take 1 tablet by mouth daily.    Historical Provider, MD  methotrexate 2.5 MG tablet Take by mouth. 8 tablets PO once a week    Historical Provider, MD  naproxen sodium (ANAPROX) 220 MG tablet Take 220 mg by mouth daily as needed (for pain).    Historical Provider, MD  norgestimate-ethinyl estradiol (ORTHO-CYCLEN,SPRINTEC,PREVIFEM) 0.25-35 MG-MCG tablet Take 1 tablet by mouth daily. Patient not taking: Reported on 09/24/2014 03/12/14   Osborne Oman, MD  oxyCODONE-acetaminophen (PERCOCET) 5-325 MG per tablet Take 1 tablet by mouth every 6 (six) hours as needed for moderate pain. Patient not taking: Reported on 09/24/2014 11/22/13   Pamella Pert, MD  predniSONE (DELTASONE) 10  MG tablet Take 10 mg by mouth daily with breakfast. ets a day for a week, 1 1/2tablet a day for a week, 1 tablet per day for a week    Historical Provider, MD    Family History Family History  Problem Relation Age of Onset  . Hypertension Mother   . Heart disease Mother   . Hypertension Father   . Hypertension Sister   . Depression Maternal Uncle   . Depression Maternal Grandmother   . Rheum arthritis Maternal Grandmother   . Asthma Son   . Allergies Son     Social History Social History  Substance Use Topics  . Smoking status: Never  Smoker  . Smokeless tobacco: Not on file  . Alcohol use No     Allergies   Penicillins   Review of Systems Review of Systems  Constitutional: Negative for diaphoresis, fatigue and fever.  Respiratory: Positive for cough and shortness of breath.   Cardiovascular: Positive for chest pain.  Musculoskeletal: Negative for back pain, myalgias, neck pain and neck stiffness.  Skin: Negative for rash.  All other systems reviewed and are negative.    Physical Exam Updated Vital Signs BP 134/73 (BP Location: Left Arm)   Pulse 91   Temp 98.3 F (36.8 C) (Oral)   Resp 18   Ht 5\' 2"  (1.575 m)   Wt 192 lb (87.1 kg)   LMP 02/02/2016 (Approximate)   SpO2 100%   BMI 35.12 kg/m   Physical Exam  Constitutional: She is oriented to person, place, and time. She appears well-developed and well-nourished. No distress.  HENT:  Head: Normocephalic and atraumatic.  Mouth/Throat: Oropharynx is clear and moist.  Eyes: EOM are normal. Pupils are equal, round, and reactive to light.  Neck: Normal range of motion.  No carotid bruits. Trachea is midline.  Cardiovascular: Normal rate, regular rhythm and normal heart sounds.   PT pulses intact bilaterally.  Pulmonary/Chest: Effort normal and breath sounds normal. No stridor.  Abdominal: Soft. She exhibits no distension. There is no tenderness.  Musculoskeletal: Normal range of motion. She exhibits no edema.  All compartments soft. No cords.  Neurological: She is alert and oriented to person, place, and time. She has normal reflexes. She displays normal reflexes. Coordination normal.  Skin: Skin is warm and dry. Capillary refill takes less than 2 seconds.  No osler nodes, no splinter hemorrhages, no Janeway lesions.  Joint are stiff but mobile  Psychiatric: She has a normal mood and affect. Judgment normal.  Nursing note and vitals reviewed.    ED Treatments / Results   DIAGNOSTIC STUDIES: Oxygen Saturation is 100% on RA, Normal by my  interpretation.    COORDINATION OF CARE: 3:07 AM-Discussed treatment plan with pt at bedside and pt agreed to plan.     Labs (all labs ordered are listed, but only abnormal results are displayed) Labs Reviewed - No data to display Results for orders placed or performed during the hospital encounter of [id number]  Basic metabolic panel  Result Value Ref Range   Sodium 138 135 - 145 mmol/L   Potassium 3.0 (L) 3.5 - 5.1 mmol/L   Chloride 101 101 - 111 mmol/L   CO2 29 22 - 32 mmol/L   Glucose, Bld 94 65 - 99 mg/dL   BUN 11 6 - 20 mg/dL   Creatinine, Ser 0.87 0.44 - 1.00 mg/dL   Calcium 9.1 8.9 - 10.3 mg/dL   GFR calc non Af Amer >60 >60 mL/min   GFR  calc Af Amer >60 >60 mL/min   Anion gap 8 5 - 15  CBC  Result Value Ref Range   WBC 7.0 4.0 - 10.5 K/uL   RBC 4.14 3.87 - 5.11 MIL/uL   Hemoglobin 9.9 (L) 12.0 - 15.0 g/dL   HCT 32.9 (L) 36.0 - 46.0 %   MCV 79.5 78.0 - 100.0 fL   MCH 23.9 (L) 26.0 - 34.0 pg   MCHC 30.1 30.0 - 36.0 g/dL   RDW 15.6 (H) 11.5 - 15.5 %   Platelets 369 150 - 400 K/uL  I-stat troponin, ED  Result Value Ref Range   Troponin i, poc 0.00 0.00 - 0.08 ng/mL   Comment 3          I-Stat Beta hCG blood, ED ([facility], [facility], [facility] only)  Result Value Ref Range   I-stat hCG, quantitative <5.0 <5 mIU/mL   Comment 3           Dg Chest 2 View  Result Date: 02/09/2016 CLINICAL DATA:  Midsternal to left-sided chest pain, worse with inspiration for 1 week. EXAM: CHEST  2 VIEW COMPARISON:  09/24/2014 FINDINGS: The cardiac silhouette is mildly enlarged. The patient has taken a shallower inspiration than on the prior study. Peribronchial thickening is mildly more prominent than on the prior study. No confluent airspace opacity, overt pulmonary edema, pleural effusion, or pneumothorax is identified. No acute osseous abnormality is seen. IMPRESSION: Mild cardiomegaly.  Mild bronchitic changes. Electronically Signed   By: Logan Bores M.D.   On: 02/09/2016 18:40   Ct Angio Chest Pe W  And/or Wo Contrast  Result Date: 02/10/2016 CLINICAL DATA:  Substernal chest pain, cough, and shortness of breath for 1 week. History of rheumatoid arthritis. EXAM: CT ANGIOGRAPHY CHEST WITH CONTRAST TECHNIQUE: Multidetector CT imaging of the chest was performed using the standard protocol during bolus administration of intravenous contrast. Multiplanar CT image reconstructions and MIPs were obtained to evaluate the vascular anatomy. CONTRAST:  100 mL Isovue 370 COMPARISON:  11/08/2012 FINDINGS: Cardiovascular: Satisfactory opacification of the pulmonary arteries to the segmental level. No evidence of pulmonary embolism. Normal heart size. No pericardial effusion. Mediastinum/Nodes: Scattered lymph nodes in the mediastinum are not pathologically enlarged and likely reactive. Esophagus is decompressed. Small esophageal hiatal hernia. Lungs/Pleura: Evaluation is technically limited due to motion artifact. Suggestion of some focal patchy airspace disease in the left lingula which may indicate pneumonia although motion artifact could have this appearance. No pleural effusions. No pneumothorax. Airways appear patent. Upper Abdomen: No acute abnormality. Musculoskeletal: Degenerative changes in the spine. No destructive bone lesions. Calcification at the sternomanubrial joint consistent with degenerative change. Review of the MIP images confirms the above findings. IMPRESSION: No evidence of significant pulmonary embolus. Focal area of patchy airspace disease in the left lingula may indicate focal pneumonia. Electronically Signed   By: Lucienne Capers M.D.   On: 02/10/2016 03:57    EKG  EKG Interpretation  Date/Time:  Wednesday February 09 2016 22:30:01 EDT Ventricular Rate:  89 PR Interval:    QRS Duration: 91 QT Interval:  354 QTC Calculation: 431 R Axis:   78 Text Interpretation:  Sinus rhythm Borderline T wave abnormalities No significant change since last tracing Confirmed by KNAPP  MD-J, JON ([id number])  on 02/09/2016 10:35:46 PM       Radiology Dg Chest 2 View  Result Date: 02/09/2016 CLINICAL DATA:  Midsternal to left-sided chest pain, worse with inspiration for 1 week. EXAM: CHEST  2 VIEW COMPARISON:  09/24/2014  FINDINGS: The cardiac silhouette is mildly enlarged. The patient has taken a shallower inspiration than on the prior study. Peribronchial thickening is mildly more prominent than on the prior study. No confluent airspace opacity, overt pulmonary edema, pleural effusion, or pneumothorax is identified. No acute osseous abnormality is seen. IMPRESSION: Mild cardiomegaly.  Mild bronchitic changes. Electronically Signed   By: Logan Bores M.D.   On: 02/09/2016 18:40    Procedures Procedures (including critical care time)  Medications Ordered in ED Medications - No data to display   Initial Impression / Assessment and Plan / ED Course  I have reviewed the triage vital signs and the nursing notes.  Pertinent labs & imaging results that were available during my care of the patient were reviewed by me and considered in my medical decision making (see chart for details).  Clinical Course    Medications  lidocaine (LIDODERM) 5 % 2 patch (2 patches Transdermal Patch Applied 02/10/16 0547)  ketorolac (TORADOL) 30 MG/ML injection 30 mg (30 mg Intravenous Given 02/10/16 0332)  iopamidol (ISOVUE-370) 76 % injection 100 mL (100 mLs Intravenous Contrast Given 02/10/16 0342)  azithromycin (ZITHROMAX) tablet 500 mg (500 mg Oral Given 02/10/16 0414)  methylPREDNISolone sodium succinate (SOLU-MEDROL) 125 mg/2 mL injection 125 mg (125 mg Intravenous Given 02/10/16 0443)  methocarbamol (ROBAXIN) tablet 1,000 mg (1,000 mg Oral Given 02/10/16 0547)    Troponin normal after more than 24 hours of ongoing pain.  HEART score 1 low risk for mace.   Final Clinical Impressions(s) / ED Diagnoses   Final diagnoses:  None   Pain in the joints is most certainly a RA flare.  CP and SOB is secondary to PNA  will treat with azithromycin.  Will start steroids muscle relaxants and NSAIDs for RA flare.  Follow up with your RA specialist.   New Prescriptions New Prescriptions   No medications on file  All questions answered to patient's satisfaction. Based on history and exam patient has been appropriately medically screened and emergency conditions excluded. Patient is stable for discharge at this time. Follow up with your PMD for recheck in 2 days and strict return precautions given   I personally performed the services described in this documentation, which was scribed in my presence. The recorded information has been reviewed and is accurate.      Veatrice Kells, MD 02/10/16 [id number]    Veatrice Kells, MD 02/10/16 [phone]

## 2016-02-10 NOTE — ED Notes (Signed)
Pt c/o generalized pain and stated she "feels pressure" in chest. VSS and NAD noted at this time.

## 2016-08-29 DIAGNOSIS — Z91199 Patient's noncompliance with other medical treatment and regimen due to unspecified reason: Secondary | ICD-10-CM

## 2016-08-29 HISTORY — DX: Patient's noncompliance with other medical treatment and regimen due to unspecified reason: Z91.199

## 2017-05-30 ENCOUNTER — Other Ambulatory Visit: Payer: Self-pay | Admitting: Obstetrics & Gynecology

## 2017-05-30 DIAGNOSIS — Z1231 Encounter for screening mammogram for malignant neoplasm of breast: Secondary | ICD-10-CM

## 2017-06-22 ENCOUNTER — Ambulatory Visit: Payer: Self-pay

## 2018-07-25 ENCOUNTER — Emergency Department (HOSPITAL_COMMUNITY): Payer: Medicare Other

## 2018-07-25 ENCOUNTER — Other Ambulatory Visit: Payer: Self-pay

## 2018-07-25 ENCOUNTER — Emergency Department (HOSPITAL_COMMUNITY)
Admission: EM | Admit: 2018-07-25 | Discharge: 2018-07-25 | Disposition: A | Discharge status: Home / Self Care | Payer: Medicare Other | Attending: Emergency Medicine | Admitting: Emergency Medicine

## 2018-07-25 ENCOUNTER — Encounter (HOSPITAL_COMMUNITY): Payer: Self-pay | Admitting: Emergency Medicine

## 2018-07-25 DIAGNOSIS — K30 Functional dyspepsia: Secondary | ICD-10-CM | POA: Insufficient documentation

## 2018-07-25 DIAGNOSIS — Z79899 Other long term (current) drug therapy: Secondary | ICD-10-CM | POA: Diagnosis not present

## 2018-07-25 DIAGNOSIS — I10 Essential (primary) hypertension: Secondary | ICD-10-CM | POA: Diagnosis not present

## 2018-07-25 DIAGNOSIS — R0602 Shortness of breath: Secondary | ICD-10-CM | POA: Diagnosis present

## 2018-07-25 DIAGNOSIS — R1011 Right upper quadrant pain: Secondary | ICD-10-CM | POA: Insufficient documentation

## 2018-07-25 LAB — COMPREHENSIVE METABOLIC PANEL
ALT: 19 U/L (ref 0–44)
ANION GAP: 5 (ref 5–15)
AST: 30 U/L (ref 15–41)
Albumin: 3.4 g/dL — ABNORMAL LOW (ref 3.5–5.0)
Alkaline Phosphatase: 55 U/L (ref 38–126)
BUN: 11 mg/dL (ref 6–20)
CO2: 30 mmol/L (ref 22–32)
Calcium: 8.8 mg/dL — ABNORMAL LOW (ref 8.9–10.3)
Chloride: 102 mmol/L (ref 98–111)
Creatinine, Ser: 0.86 mg/dL (ref 0.44–1.00)
GFR calc non Af Amer: >60 mL/min (ref >60)
Glucose, Bld: 102 mg/dL — ABNORMAL HIGH (ref 70–99)
POTASSIUM: 3.6 mmol/L (ref 3.5–5.1)
Sodium: 137 mmol/L (ref 135–145)
Total Bilirubin: 0.2 mg/dL — ABNORMAL LOW (ref 0.3–1.2)
Total Protein: 7.5 g/dL (ref 6.5–8.1)

## 2018-07-25 LAB — CBC WITH DIFFERENTIAL/PLATELET
Abs Immature Granulocytes: 0 10*3/uL (ref 0.00–0.07)
Basophils Absolute: 0.1 10*3/uL (ref 0.0–0.1)
Basophils Relative: 2 %
Eosinophils Absolute: 0.2 10*3/uL (ref 0.0–0.5)
Eosinophils Relative: 3 %
HCT: 32.6 % — ABNORMAL LOW (ref 36.0–46.0)
HEMOGLOBIN: 9.9 g/dL — AB (ref 12.0–15.0)
LYMPHS ABS: 1.7 10*3/uL (ref 0.7–4.0)
Lymphocytes Relative: 30 %
MCH: 22.8 pg — ABNORMAL LOW (ref 26.0–34.0)
MCHC: 30.4 g/dL (ref 30.0–36.0)
MCV: 74.9 fL — ABNORMAL LOW (ref 80.0–100.0)
Monocytes Absolute: 0 10*3/uL — ABNORMAL LOW (ref 0.1–1.0)
Monocytes Relative: 0 %
NEUTROS ABS: 3.6 10*3/uL (ref 1.7–7.7)
Neutrophils Relative %: 65 %
Platelets: 441 10*3/uL — ABNORMAL HIGH (ref 150–400)
RBC: 4.35 MIL/uL (ref 3.87–5.11)
RDW: 15.8 % — ABNORMAL HIGH (ref 11.5–15.5)
WBC: 5.6 10*3/uL (ref 4.0–10.5)
nRBC: 0 % (ref 0.0–0.2)
nRBC: 0 /100 WBC

## 2018-07-25 LAB — URINALYSIS, ROUTINE W REFLEX MICROSCOPIC
Bacteria, UA: NONE SEEN
Bilirubin Urine: NEGATIVE
Glucose, UA: NEGATIVE mg/dL
Ketones, ur: NEGATIVE mg/dL
Leukocytes,Ua: NEGATIVE
Nitrite: NEGATIVE
Protein, ur: NEGATIVE mg/dL
Specific Gravity, Urine: 1.017 (ref 1.005–1.030)
pH: 6 (ref 5.0–8.0)

## 2018-07-25 LAB — POC URINE PREG, ED: Preg Test, Ur: NEGATIVE

## 2018-07-25 LAB — LIPASE, BLOOD: Lipase: 40 U/L (ref 11–51)

## 2018-07-25 MED ORDER — SUCRALFATE 1 G PO TABS: 1.0000 g | ORAL_TABLET | Freq: Four times a day (QID) | ORAL | 0 refills | Status: DC | PRN

## 2018-07-25 NOTE — Discharge Instructions (Addendum)
You were seen in the ER for right upper and middle abdominal pain, worsening acid reflux symptoms.  Labs looked normal.  Right upper quadrant abdominal ultrasound was normal.  Urinalysis was normal.    You were seen in the ER for fabdominal pain. Labs were normal.   I suspect your symptoms may be from gastritis or  acid reflux.  You can use the medication provided as needed for discomfort.  Avoid irritating foods and liquids such as alcohol, greasy/fatty or acidic foods. Avoid ibuprofen containing products. You can take tylenol for pain.   Follow up with primary care doctor for further management of this if it persits.   Return to the ER for fever, chills, blood in vomit or stool, worsening localized abdominal pain to right upper or right lower abdomen, inability to tolerate fluids, chest pain or shortness of breath on exertion.

## 2018-07-25 NOTE — ED Triage Notes (Signed)
Patient c/o SOB and dry, non productive cough for 2 days with right side pain. No c/o CP. Hx of HTN. Patient in NAD in triage.

## 2018-07-25 NOTE — ED Provider Notes (Signed)
[facility] EMERGENCY DEPARTMENT Provider Note   CSN: [id number] Arrival date & time: 07/25/18  1800    History   Chief Complaint Chief Complaint  Patient presents with  . Shortness of Breath    HPI Michele Lang is a 43 y.o. female with history of rheumatoid arthritis on methotrexate and Enbrel injections, acid reflux is here for evaluation of right upper quadrant abdominal pain.  It radiates into her epigastrium and right flank.  Feels like it is under her right breast/rib cage.  Described as sharp, stabbing, intermittent, worse when she is burping, breathing, palpation, moving around.  She takes Tylenol and ibuprofen for her RA pain and this did not help.  Noticed a very mild, dry cough today.  No alleviating factors.  Currently her pain is mild/moderate.  Reports worsening acid reflux over the last several weeks that she described as indigestion, regurgitation in her throat, bloating specifically after largest meal of the day at nighttime.  This feels like it worsens when she tries to lay down and is better when she sits up.  Other associated symptoms include nausea.  No previous abdominal surgery.  Takes Tums as needed for acid reflux symptoms.     HPI  Past Medical History:  Diagnosis Date  . Anxiety   . Arthritis   . Hypertension     Patient Active Problem List   Diagnosis Date Noted  . Chest pain, atypical 11/22/2013  . Essential hypertension, benign 01/04/2013  . Cough 01/04/2013  . Pleural effusion 01/03/2013    Past Surgical History:  Procedure Laterality Date  . BREAST SURGERY    . FOOT SURGERY       OB History    Gravida  4   Para  3   Term  3   Preterm  0   AB  1   Living  3     SAB  1   TAB  0   Ectopic  0   Multiple  0   Live Births               Home Medications    Prior to Admission medications   Medication Sig Start Date End Date Taking? Authorizing Provider  ENBREL SURECLICK 50 MG/ML injection  Inject 50 mg into the skin every Wednesday. 07/18/18  Yes [provider]  folic acid (FOLVITE) 1 MG tablet Take 1 mg by mouth daily.   Yes [provider]  IRBESARTAN PO Take 1 tablet by mouth daily.    Yes [provider]  methotrexate 2.5 MG tablet Take 20 mg by mouth once a week. Take 8 tablets by mouth once a week   Yes [provider]  azithromycin (ZITHROMAX Z-PAK) 250 MG tablet 2 po day one, then 1 daily x 4 days Patient not taking: Reported on 07/25/2018 02/10/16   Palumbo, April, MD  meloxicam (MOBIC) 15 MG tablet Take 1 tablet (15 mg total) by mouth daily. Patient not taking: Reported on 07/25/2018 02/10/16   Palumbo, April, MD  methocarbamol (ROBAXIN) 500 MG tablet Take 1 tablet (500 mg total) by mouth 2 (two) times daily. Patient not taking: Reported on 07/25/2018 02/10/16   Palumbo, April, MD  norgestimate-ethinyl estradiol (ORTHO-CYCLEN,SPRINTEC,PREVIFEM) 0.25-35 MG-MCG tablet Take 1 tablet by mouth daily. Patient not taking: Reported on 09/24/2014 03/12/14   Anyanwu, Sallyanne Havers, MD  oxyCODONE-acetaminophen (PERCOCET) 5-325 MG per tablet Take 1 tablet by mouth every 6 (six) hours as needed for moderate pain. Patient  not taking: Reported on 09/24/2014 11/22/13   Pamella Pert, MD  predniSONE (DELTASONE) 20 MG tablet 3 tabs po day one, then 2 po daily x 4 days Patient not taking: Reported on 07/25/2018 02/10/16   Randal Buba, April, MD    Family History Family History  Problem Relation Age of Onset  . Hypertension Mother   . Heart disease Mother   . Hypertension Father   . Hypertension Sister   . Depression Maternal Uncle   . Depression Maternal Grandmother   . Rheum arthritis Maternal Grandmother   . Asthma Son   . Allergies Son     Social History Social History   Tobacco Use  . Smoking status: Never Smoker  Substance Use Topics  . Alcohol use: No  . Drug use: No     Allergies   Penicillins   Review of Systems Review of Systems    Respiratory: Positive for cough.   Gastrointestinal: Positive for abdominal pain and nausea.       Regurgitation, belching, bloating  All other systems reviewed and are negative.    Physical Exam Updated Vital Signs BP 133/70   Pulse 86   Temp 97.9 F (36.6 C) (Oral)   Resp 18   Ht 5\' 1"  (1.549 m)   Wt 89.4 kg   LMP 07/18/2018 (Exact Date)   SpO2 97%   BMI 37.22 kg/m   Physical Exam Vitals signs and nursing note reviewed.  Constitutional:      Appearance: She is well-developed.     Comments: Non toxic  HENT:     Head: Normocephalic and atraumatic.     Nose: Nose normal.  Eyes:     Conjunctiva/sclera: Conjunctivae normal.     Pupils: Pupils are equal, round, and reactive to light.  Neck:     Musculoskeletal: Normal range of motion.  Cardiovascular:     Rate and Rhythm: Normal rate and regular rhythm.     Comments: 1+ radial and DP pulses bilaterally.  No chest wall tenderness. Pulmonary:     Effort: Pulmonary effort is normal.     Breath sounds: Normal breath sounds.  Abdominal:     General: Bowel sounds are normal.     Palpations: Abdomen is soft.     Tenderness: There is abdominal tenderness.     Comments: Moderate right upper quadrant tenderness.  Positive Murphy's.  Positive right CVA tenderness, mild.  No right CVA/thoracic muscular tenderness with palpation or movement.  No pain with inspiration.  No G/R/R. No CVA tenderness. Negative McBurney's. Active BS to lower quadrants.   Musculoskeletal: Normal range of motion.  Skin:    General: Skin is warm and dry.     Capillary Refill: Capillary refill takes less than 2 seconds.  Neurological:     Mental Status: She is alert and oriented to person, place, and time.  Psychiatric:        Behavior: Behavior normal.      ED Treatments / Results  Labs (all labs ordered are listed, but only abnormal results are displayed) Labs Reviewed  CBC WITH DIFFERENTIAL/PLATELET - Abnormal; Notable for the following  components:      Result Value   Hemoglobin 9.9 (*)    HCT 32.6 (*)    MCV 74.9 (*)    MCH 22.8 (*)    RDW 15.8 (*)    Platelets 441 (*)    Monocytes Absolute 0.0 (*)    All other components within normal limits  COMPREHENSIVE METABOLIC PANEL - Abnormal;  Notable for the following components:   Glucose, Bld 102 (*)    Calcium 8.8 (*)    Albumin 3.4 (*)    Total Bilirubin 0.2 (*)    All other components within normal limits  LIPASE, BLOOD  URINALYSIS, ROUTINE W REFLEX MICROSCOPIC  POC URINE PREG, ED    EKG EKG Interpretation  Date/Time:  Thursday July 25 2018 18:08:21 EST Ventricular Rate:  88 PR Interval:  138 QRS Duration: 70 QT Interval:  358 QTC Calculation: 433 R Axis:   94 Text Interpretation:  Normal sinus rhythm Rightward axis Nonspecific T wave abnormality Abnormal ECG Confirmed by Gerlene Fee [phone]) on 07/25/2018 9:34:50 PM   Radiology Dg Chest 2 View  Result Date: 07/25/2018 CLINICAL DATA:  Dyspnea x2 days EXAM: CHEST - 2 VIEW COMPARISON:  02/09/2016 FINDINGS: Borderline cardiomegaly with moderate aortic atherosclerosis. No alveolar consolidation, effusion or pneumothorax. Mild diffuse interstitial prominence likely representing chronic bronchitic change is identified. No pneumothorax or effusion. No acute osseous abnormality. IMPRESSION: Borderline cardiomegaly with aortic atherosclerosis. Mild interstitial prominence that may reflect chronic bronchitic change. Electronically Signed   By: Ashley Royalty M.D.   On: 07/25/2018 18:51   US Abdomen Limited Ruq  Result Date: 07/25/2018 CLINICAL DATA:  RIGHT upper quadrant abdominal pain EXAM: ULTRASOUND ABDOMEN LIMITED RIGHT UPPER QUADRANT COMPARISON:  None. FINDINGS: Gallbladder: Gallbladder is contracted limiting characterization of the gallbladder walls. No gallstones or obvious wall thickening visualized. No sonographic Murphy sign noted by sonographer. Common bile duct: Diameter: 5 mm Liver: No focal lesion identified.  Within normal limits in parenchymal echogenicity. Portal vein is patent on color Doppler imaging with normal direction of blood flow towards the liver. IMPRESSION: No acute findings.  Gallbladder is contracted. Electronically Signed   By: Franki Cabot M.D.   On: 07/25/2018 21:12    Procedures Procedures (including critical care time)  Medications Ordered in ED Medications - No data to display   Initial Impression / Assessment and Plan / ED Course  I have reviewed the triage vital signs and the nursing notes.  Pertinent labs & imaging results that were available during my care of the patient were reviewed by me and considered in my medical decision making (see chart for details).  Clinical Course as of Jul 24 2140  Thu Jul 25, 2018  2139 UA, home, gerd   [MB]    Clinical Course User Index [MB] Maudie Flakes, MD       ddx includes gastritis vs PUD vs GERD vs biliary colic vs cholelithiasis vs cholecystitis.  +Murphy's. +R CVAT.  Less likely to be renal stone, no h/o of same, hematuria, urinary symptoms. No associated exertional CP, SOB, palpitations.  She has been noticing worsening acid reflux symptoms, belching, bloating, regurgitation.  Triage obtained CXR and EKG.   2130: EKG has nonspecific T wave abnormalities, unchanged when compared to previous.  No ST elevations or T wave inversions.  She is not really reporting ACS type symptoms.  Chest x-ray without infiltrate, effusions, abnormalities to the right lung.  Right upper quadrant ultrasound negative.  LFTs, lipase unremarkable.  No leukocytosis.  Discussed work-up with patient.  We are awaiting urinalysis.  Will handoff to ED MD who will follow-up on this and discharge.  Will DC with omeprazole, famotidine, diet changes and follow-up with PCP for ongoing acid reflux symptoms.  Consider antibiotics for UTI/Pilo based on UA.  Patient is comfortable with this.  Return precautions were given.  Final Clinical Impressions(s) / ED  Diagnoses  Final diagnoses:  RUQ abdominal pain  Indigestion    ED Discharge Orders    None       Arlean Hopping 07/25/18 2142    Maudie Flakes, MD 07/25/18 2155    Maudie Flakes, MD 07/25/18 2225    Maudie Flakes, MD 07/25/18 2226

## 2018-08-01 ENCOUNTER — Encounter (HOSPITAL_COMMUNITY): Payer: Self-pay | Admitting: Emergency Medicine

## 2018-08-01 ENCOUNTER — Other Ambulatory Visit: Payer: Self-pay

## 2018-08-01 ENCOUNTER — Emergency Department (HOSPITAL_COMMUNITY)
Admission: EM | Admit: 2018-08-01 | Discharge: 2018-08-01 | Disposition: A | Discharge status: Home / Self Care | Payer: Medicare Other | Attending: Emergency Medicine | Admitting: Emergency Medicine

## 2018-08-01 DIAGNOSIS — K219 Gastro-esophageal reflux disease without esophagitis: Secondary | ICD-10-CM | POA: Insufficient documentation

## 2018-08-01 DIAGNOSIS — R1013 Epigastric pain: Secondary | ICD-10-CM

## 2018-08-01 DIAGNOSIS — I10 Essential (primary) hypertension: Secondary | ICD-10-CM | POA: Insufficient documentation

## 2018-08-01 DIAGNOSIS — M069 Rheumatoid arthritis, unspecified: Secondary | ICD-10-CM | POA: Insufficient documentation

## 2018-08-01 DIAGNOSIS — Z79899 Other long term (current) drug therapy: Secondary | ICD-10-CM | POA: Insufficient documentation

## 2018-08-01 LAB — COMPREHENSIVE METABOLIC PANEL
ALT: 13 U/L (ref 0–44)
AST: 17 U/L (ref 15–41)
Albumin: 3.6 g/dL (ref 3.5–5.0)
Alkaline Phosphatase: 58 U/L (ref 38–126)
Anion gap: 7 (ref 5–15)
BUN: 15 mg/dL (ref 6–20)
CO2: 25 mmol/L (ref 22–32)
Calcium: 8.9 mg/dL (ref 8.9–10.3)
Chloride: 102 mmol/L (ref 98–111)
Creatinine, Ser: 0.74 mg/dL (ref 0.44–1.00)
GFR calc Af Amer: >60 mL/min (ref >60)
GFR calc non Af Amer: >60 mL/min (ref >60)
Glucose, Bld: 105 mg/dL — ABNORMAL HIGH (ref 70–99)
Potassium: 3.8 mmol/L (ref 3.5–5.1)
Sodium: 134 mmol/L — ABNORMAL LOW (ref 135–145)
Total Bilirubin: 0.5 mg/dL (ref 0.3–1.2)
Total Protein: 7.8 g/dL (ref 6.5–8.1)

## 2018-08-01 LAB — CBC
HCT: 32.1 % — ABNORMAL LOW (ref 36.0–46.0)
Hemoglobin: 9.8 g/dL — ABNORMAL LOW (ref 12.0–15.0)
MCH: 22.8 pg — ABNORMAL LOW (ref 26.0–34.0)
MCHC: 30.5 g/dL (ref 30.0–36.0)
MCV: 74.8 fL — ABNORMAL LOW (ref 80.0–100.0)
Platelets: UNDETERMINED 10*3/uL (ref 150–400)
RBC: 4.29 MIL/uL (ref 3.87–5.11)
RDW: 15.9 % — AB (ref 11.5–15.5)
WBC: 6.6 10*3/uL (ref 4.0–10.5)
nRBC: 0 % (ref 0.0–0.2)

## 2018-08-01 LAB — I-STAT BETA HCG BLOOD, ED (MC, WL, AP ONLY): I-stat hCG, quantitative: <5 m[IU]/mL (ref <5)

## 2018-08-01 LAB — LIPASE, BLOOD: Lipase: 40 U/L (ref 11–51)

## 2018-08-01 MED ORDER — SODIUM CHLORIDE 0.9% FLUSH: 3.0000 mL | Freq: Once | INTRAVENOUS | Status: DC

## 2018-08-01 NOTE — ED Notes (Signed)
Pt given pill splitter and took her home dose of carafate. Pt swallowed without difficulty.

## 2018-08-01 NOTE — ED Triage Notes (Signed)
Pt reports RUQ pain radiating to side and back. Pt reports being seen 3/5, told it could be gallbladder or reflux, given carafate but reports she hasn't been able to swallow that pill. Pt denies nausea/vomiting, changes in urination, hematuria, or diarrhea.

## 2018-08-01 NOTE — ED Provider Notes (Signed)
[facility] EMERGENCY DEPARTMENT Provider Note   CSN: [id number] Arrival date & time: 08/01/18  1702    History   Chief Complaint Chief Complaint  Patient presents with   Abdominal Pain    HPI Michele Lang is a 43 y.o. female who presents with abdominal pain.  Past medical history significant for rheumatoid arthritis, hypertension, acid reflux.  The patient states that she has been having intermittent abdominal pain for the past week and a half.  The pain is in the epigastric area and right upper quadrant and radiates around to her back.  The pain is intermittent and sometimes exacerbated by eating. It is also worse when she bends over or twists.  She was seen in the ED on March 5 and right upper quadrant ultrasound was negative.  Her labs were essentially normal and she was prescribed Carafate and advised to get Pepcid over-the-counter.  She states that she did get Pepcid and it helped somewhat.  She also got the Carafate but could not swallow the pill.  She denies fever, chest pain, shortness of breath, vomiting, diarrhea, urinary symptoms.  She denies any blood in the stool.  She does not take an excessive amount of NSAIDs or drink alcohol.     HPI  Past Medical History:  Diagnosis Date   Anxiety    Arthritis    Hypertension     Patient Active Problem List   Diagnosis Date Noted   Chest pain, atypical 11/22/2013   Essential hypertension, benign 01/04/2013   Cough 01/04/2013   Pleural effusion 01/03/2013    Past Surgical History:  Procedure Laterality Date   BREAST SURGERY     FOOT SURGERY       OB History    Gravida  4   Para  3   Term  3   Preterm  0   AB  1   Living  3     SAB  1   TAB  0   Ectopic  0   Multiple  0   Live Births               Home Medications    Prior to Admission medications   Medication Sig Start Date End Date Taking? Authorizing Provider  azithromycin (ZITHROMAX Z-PAK) 250 MG  tablet 2 po day one, then 1 daily x 4 days Patient not taking: Reported on 07/25/2018 02/10/16   Palumbo, April, MD  ENBREL SURECLICK 50 MG/ML injection Inject 50 mg into the skin every Wednesday. 07/18/18   [provider]  folic acid (FOLVITE) 1 MG tablet Take 1 mg by mouth daily.    [provider]  IRBESARTAN PO Take 1 tablet by mouth daily.     [provider]  meloxicam (MOBIC) 15 MG tablet Take 1 tablet (15 mg total) by mouth daily. Patient not taking: Reported on 07/25/2018 02/10/16   Palumbo, April, MD  methocarbamol (ROBAXIN) 500 MG tablet Take 1 tablet (500 mg total) by mouth 2 (two) times daily. Patient not taking: Reported on 07/25/2018 02/10/16   Palumbo, April, MD  methotrexate 2.5 MG tablet Take 20 mg by mouth once a week. Take 8 tablets by mouth once a week    [provider]  norgestimate-ethinyl estradiol (ORTHO-CYCLEN,SPRINTEC,PREVIFEM) 0.25-35 MG-MCG tablet Take 1 tablet by mouth daily. Patient not taking: Reported on 09/24/2014 03/12/14   Osborne Oman, MD  oxyCODONE-acetaminophen (PERCOCET) 5-325 MG per tablet Take 1 tablet by mouth every 6 (six)  hours as needed for moderate pain. Patient not taking: Reported on 09/24/2014 11/22/13   Pamella Pert, MD  predniSONE (DELTASONE) 20 MG tablet 3 tabs po day one, then 2 po daily x 4 days Patient not taking: Reported on 07/25/2018 02/10/16   Palumbo, April, MD  sucralfate (CARAFATE) 1 g tablet Take 1 tablet (1 g total) by mouth 4 (four) times daily as needed. 07/25/18   Maudie Flakes, MD    Family History Family History  Problem Relation Age of Onset   Hypertension Mother    Heart disease Mother    Hypertension Father    Hypertension Sister    Depression Maternal Uncle    Depression Maternal Grandmother    Rheum arthritis Maternal Grandmother    Asthma Son    Allergies Son     Social History Social History   Tobacco Use   Smoking status: Never Smoker   Smokeless tobacco: Never  Used  Substance Use Topics   Alcohol use: No   Drug use: No     Allergies   Penicillins   Review of Systems Review of Systems  Constitutional: Negative for fever.  Respiratory: Negative for shortness of breath.   Cardiovascular: Negative for chest pain.  Gastrointestinal: Positive for abdominal pain. Negative for diarrhea, nausea and vomiting.  Genitourinary: Negative for dysuria.  All other systems reviewed and are negative.    Physical Exam Updated Vital Signs BP (!) 177/114 (BP Location: Left Arm)    Pulse 91    Temp 98.9 F (37.2 C) (Oral)    Resp 16    LMP 07/18/2018 (Exact Date)    SpO2 98%   Physical Exam Vitals signs and nursing note reviewed.  Constitutional:      General: She is not in acute distress.    Appearance: She is well-developed. She is obese. She is not ill-appearing.     Comments: Calm and cooperative  HENT:     Head: Normocephalic and atraumatic.  Eyes:     General: No scleral icterus.       Right eye: No discharge.        Left eye: No discharge.     Conjunctiva/sclera: Conjunctivae normal.     Pupils: Pupils are equal, round, and reactive to light.  Neck:     Musculoskeletal: Normal range of motion.  Cardiovascular:     Rate and Rhythm: Normal rate and regular rhythm.  Pulmonary:     Effort: Pulmonary effort is normal. No respiratory distress.     Breath sounds: Normal breath sounds.  Abdominal:     General: There is no distension.     Palpations: Abdomen is soft. There is no mass.     Tenderness: There is abdominal tenderness (minimal tenderness in epigastric area). There is no right CVA tenderness, left CVA tenderness, guarding or rebound.     Hernia: No hernia is present.  Skin:    General: Skin is warm and dry.  Neurological:     Mental Status: She is alert and oriented to person, place, and time.  Psychiatric:        Behavior: Behavior normal.      ED Treatments / Results  Labs (all labs ordered are listed, but only  abnormal results are displayed) Labs Reviewed  COMPREHENSIVE METABOLIC PANEL - Abnormal; Notable for the following components:      Result Value   Sodium 134 (*)    Glucose, Bld 105 (*)    All other components within normal limits  CBC - Abnormal; Notable for the following components:   Hemoglobin 9.8 (*)    HCT 32.1 (*)    MCV 74.8 (*)    MCH 22.8 (*)    RDW 15.9 (*)    All other components within normal limits  LIPASE, BLOOD  I-STAT BETA HCG BLOOD, ED ([facility], [facility], [facility] ONLY)    EKG None  Radiology No results found.  Procedures Procedures (including critical care time)  Medications Ordered in ED Medications  sodium chloride flush (NS) 0.9 % injection 3 mL (has no administration in time range)     Initial Impression / Assessment and Plan / ED Course  I have reviewed the triage vital signs and the nursing notes.  Pertinent labs & imaging results that were available during my care of the patient were reviewed by me and considered in my medical decision making (see chart for details).  43 year old female presents with ongoing intermittent upper abdominal pain.  She is hypertensive but otherwise vital signs are normal.  She has minimal tenderness in the epigastric area.  CBC is remarkable for anemia which is stable.  CMP and lipase are normal.  She had normal lab work on the fifth as well.  She states that the Pepcid has been helping somewhat.  She is taking 20 mg/week we can increase the dose for her.  She is also having trouble swallowing the Carafate.  She was given a pill splitter here and was able to take a dose here in the ED.  Suspect GERD, gastritis, or PUD. She has a primary care provider who she can follow-up with.  Final Clinical Impressions(s) / ED Diagnoses   Final diagnoses:  Epigastric pain    ED Discharge Orders    None       Recardo Evangelist, PA-C 08/02/18 [place], Julie, MD 08/05/18 [phone]

## 2018-08-01 NOTE — Discharge Instructions (Addendum)
Continue Famotidine. You can take 2 (40mg  total) twice a day Continue Carafate- please split the pill so it's easier for you to swallow Please follow up with your doctor Return if worsening

## 2018-08-01 NOTE — ED Notes (Signed)
ED Provider at bedside. 

## 2020-05-25 DIAGNOSIS — M0579 Rheumatoid arthritis with rheumatoid factor of multiple sites without organ or systems involvement: Secondary | ICD-10-CM | POA: Diagnosis not present

## 2020-05-25 DIAGNOSIS — Z880 Allergy status to penicillin: Secondary | ICD-10-CM | POA: Diagnosis not present

## 2020-05-25 DIAGNOSIS — M069 Rheumatoid arthritis, unspecified: Secondary | ICD-10-CM | POA: Diagnosis not present

## 2020-05-25 DIAGNOSIS — D509 Iron deficiency anemia, unspecified: Secondary | ICD-10-CM | POA: Diagnosis not present

## 2020-05-25 DIAGNOSIS — M25561 Pain in right knee: Secondary | ICD-10-CM | POA: Diagnosis not present

## 2020-05-25 DIAGNOSIS — Z79899 Other long term (current) drug therapy: Secondary | ICD-10-CM | POA: Diagnosis not present

## 2020-06-29 DIAGNOSIS — I10 Essential (primary) hypertension: Secondary | ICD-10-CM | POA: Diagnosis not present

## 2020-07-20 DIAGNOSIS — M0579 Rheumatoid arthritis with rheumatoid factor of multiple sites without organ or systems involvement: Secondary | ICD-10-CM | POA: Diagnosis not present

## 2020-07-20 DIAGNOSIS — R030 Elevated blood-pressure reading, without diagnosis of hypertension: Secondary | ICD-10-CM | POA: Diagnosis not present

## 2020-07-20 DIAGNOSIS — Z79899 Other long term (current) drug therapy: Secondary | ICD-10-CM | POA: Diagnosis not present

## 2020-08-03 DIAGNOSIS — T451X5A Adverse effect of antineoplastic and immunosuppressive drugs, initial encounter: Secondary | ICD-10-CM | POA: Diagnosis not present

## 2020-08-03 DIAGNOSIS — Z79899 Other long term (current) drug therapy: Secondary | ICD-10-CM | POA: Diagnosis not present

## 2020-08-03 DIAGNOSIS — R030 Elevated blood-pressure reading, without diagnosis of hypertension: Secondary | ICD-10-CM | POA: Diagnosis not present

## 2020-08-03 DIAGNOSIS — M0579 Rheumatoid arthritis with rheumatoid factor of multiple sites without organ or systems involvement: Secondary | ICD-10-CM | POA: Diagnosis not present

## 2020-08-15 ENCOUNTER — Emergency Department (HOSPITAL_COMMUNITY): Payer: Medicare HMO

## 2020-08-15 ENCOUNTER — Other Ambulatory Visit: Payer: Self-pay

## 2020-08-15 ENCOUNTER — Encounter (HOSPITAL_COMMUNITY): Payer: Self-pay | Admitting: Emergency Medicine

## 2020-08-15 ENCOUNTER — Emergency Department (HOSPITAL_COMMUNITY)
Admission: EM | Admit: 2020-08-15 | Discharge: 2020-08-15 | Disposition: A | Discharge status: Home / Self Care | Payer: Medicare HMO | Attending: Emergency Medicine | Admitting: Emergency Medicine

## 2020-08-15 DIAGNOSIS — J9811 Atelectasis: Secondary | ICD-10-CM | POA: Diagnosis not present

## 2020-08-15 DIAGNOSIS — Z79899 Other long term (current) drug therapy: Secondary | ICD-10-CM | POA: Diagnosis not present

## 2020-08-15 DIAGNOSIS — R059 Cough, unspecified: Secondary | ICD-10-CM | POA: Insufficient documentation

## 2020-08-15 DIAGNOSIS — I10 Essential (primary) hypertension: Secondary | ICD-10-CM | POA: Diagnosis not present

## 2020-08-15 DIAGNOSIS — R7989 Other specified abnormal findings of blood chemistry: Secondary | ICD-10-CM | POA: Diagnosis not present

## 2020-08-15 DIAGNOSIS — Z20822 Contact with and (suspected) exposure to covid-19: Secondary | ICD-10-CM | POA: Insufficient documentation

## 2020-08-15 DIAGNOSIS — K449 Diaphragmatic hernia without obstruction or gangrene: Secondary | ICD-10-CM | POA: Diagnosis not present

## 2020-08-15 DIAGNOSIS — R0981 Nasal congestion: Secondary | ICD-10-CM | POA: Insufficient documentation

## 2020-08-15 DIAGNOSIS — R0789 Other chest pain: Secondary | ICD-10-CM | POA: Diagnosis not present

## 2020-08-15 DIAGNOSIS — R079 Chest pain, unspecified: Secondary | ICD-10-CM | POA: Diagnosis not present

## 2020-08-15 DIAGNOSIS — R0781 Pleurodynia: Secondary | ICD-10-CM | POA: Insufficient documentation

## 2020-08-15 DIAGNOSIS — J90 Pleural effusion, not elsewhere classified: Secondary | ICD-10-CM | POA: Diagnosis not present

## 2020-08-15 DIAGNOSIS — R0602 Shortness of breath: Secondary | ICD-10-CM | POA: Diagnosis not present

## 2020-08-15 LAB — RESP PANEL BY RT-PCR (FLU A&B, COVID) ARPGX2
Influenza A by PCR: NEGATIVE
Influenza B by PCR: NEGATIVE
SARS Coronavirus 2 by RT PCR: NEGATIVE

## 2020-08-15 LAB — BASIC METABOLIC PANEL
Anion gap: 7 (ref 5–15)
BUN: 8 mg/dL (ref 6–20)
CO2: 24 mmol/L (ref 22–32)
Calcium: 8.5 mg/dL — ABNORMAL LOW (ref 8.9–10.3)
Chloride: 102 mmol/L (ref 98–111)
Creatinine, Ser: 0.59 mg/dL (ref 0.44–1.00)
GFR, Estimated: >60 mL/min (ref >60)
Glucose, Bld: 85 mg/dL (ref 70–99)
Potassium: 3.7 mmol/L (ref 3.5–5.1)
Sodium: 133 mmol/L — ABNORMAL LOW (ref 135–145)

## 2020-08-15 LAB — TROPONIN I (HIGH SENSITIVITY)
Troponin I (High Sensitivity): 3 ng/L (ref <18)
Troponin I (High Sensitivity): 4 ng/L (ref <18)

## 2020-08-15 LAB — CBC
HCT: 28.1 % — ABNORMAL LOW (ref 36.0–46.0)
Hemoglobin: 8.2 g/dL — ABNORMAL LOW (ref 12.0–15.0)
MCH: 20.4 pg — ABNORMAL LOW (ref 26.0–34.0)
MCHC: 29.2 g/dL — ABNORMAL LOW (ref 30.0–36.0)
MCV: 69.9 fL — ABNORMAL LOW (ref 80.0–100.0)
Platelets: 376 10*3/uL (ref 150–400)
RBC: 4.02 MIL/uL (ref 3.87–5.11)
RDW: 19.1 % — ABNORMAL HIGH (ref 11.5–15.5)
WBC: 5.9 10*3/uL (ref 4.0–10.5)
nRBC: 0 % (ref 0.0–0.2)

## 2020-08-15 LAB — I-STAT BETA HCG BLOOD, ED (MC, WL, AP ONLY): I-stat hCG, quantitative: <5 m[IU]/mL (ref <5)

## 2020-08-15 LAB — D-DIMER, QUANTITATIVE: D-Dimer, Quant: 3.91 ug/mL-FEU — ABNORMAL HIGH (ref 0.00–0.50)

## 2020-08-15 MED ORDER — DOXYCYCLINE HYCLATE 100 MG PO CAPS: 100.0000 mg | ORAL_CAPSULE | Freq: Two times a day (BID) | ORAL | 0 refills | Status: DC

## 2020-08-15 MED ORDER — IOHEXOL 350 MG/ML SOLN
75.0000 mL | Freq: Once | INTRAVENOUS | Status: AC | PRN
  Administered 2020-08-15: 75 mL via INTRAVENOUS

## 2020-08-15 MED ORDER — KETOROLAC TROMETHAMINE 30 MG/ML IJ SOLN
30.0000 mg | Freq: Once | INTRAMUSCULAR | Status: AC
  Administered 2020-08-15: 30 mg via INTRAVENOUS
  Filled 2020-08-15: qty 1

## 2020-08-15 NOTE — ED Notes (Signed)
Patient verbalizes understanding of discharge instructions. Opportunity for questioning and answers were provided. Armband removed by staff, pt discharged from ED.  

## 2020-08-15 NOTE — ED Provider Notes (Signed)
Care assumed from C. Couture PA-C at shift change pending CTA.  See her note for full H&P. Disposition based on CTA results.   Briefly this is a 45 year old female past medical history of anxiety, rheumatoid arthritis on Remicade, hypertension presenting with chest pain, cough and congestion x2 days.  Chest pain was pleuritic in nature as well as worse with movement.  D-dimer was elevated.  Chest x-ray does not show signs of pneumonia.  Labs significant for persistent anemia with negative troponin.  EKG normal sinus rhythm. Given toradol for pain. Heart score 2.    Physical Exam  BP (!) 160/96   Pulse 70   Temp 99.3 F (37.4 C) (Oral)   Resp 16   Ht 5\' 1"  (1.549 m)   Wt 77.1 kg   SpO2 99%   BMI 32.12 kg/m   Physical Exam  PE: Constitutional: well-developed, well-nourished, no apparent distress HENT: normocephalic, atraumatic. no cervical adenopathy Cardiovascular: normal rate and rhythm, distal pulses intact Pulmonary/Chest: effort normal; breath sounds clear and equal bilaterally; no wheezes or rales Abdominal: soft and nontender Musculoskeletal: full ROM, no edema Neurological: alert with goal directed thinking Skin: warm and dry, no rash, no diaphoresis Psychiatric: normal mood and affect, normal behavior   ED Course/Procedures     Results for orders placed or performed during the hospital encounter of 08/15/20  Resp Panel by RT-PCR (Flu A&B, Covid) Nasopharyngeal Swab   Specimen: Nasopharyngeal Swab; Nasopharyngeal(NP) swabs in vial transport medium  Result Value Ref Range   SARS Coronavirus 2 by RT PCR NEGATIVE NEGATIVE   Influenza A by PCR NEGATIVE NEGATIVE   Influenza B by PCR NEGATIVE NEGATIVE  Basic metabolic panel  Result Value Ref Range   Sodium 133 (L) 135 - 145 mmol/L   Potassium 3.7 3.5 - 5.1 mmol/L   Chloride 102 98 - 111 mmol/L   CO2 24 22 - 32 mmol/L   Glucose, Bld 85 70 - 99 mg/dL   BUN 8 6 - 20 mg/dL   Creatinine, Ser 0.59 0.44 - 1.00 mg/dL    Calcium 8.5 (L) 8.9 - 10.3 mg/dL   GFR, Estimated >60 >60 mL/min   Anion gap 7 5 - 15  CBC  Result Value Ref Range   WBC 5.9 4.0 - 10.5 K/uL   RBC 4.02 3.87 - 5.11 MIL/uL   Hemoglobin 8.2 (L) 12.0 - 15.0 g/dL   HCT 28.1 (L) 36.0 - 46.0 %   MCV 69.9 (L) 80.0 - 100.0 fL   MCH 20.4 (L) 26.0 - 34.0 pg   MCHC 29.2 (L) 30.0 - 36.0 g/dL   RDW 19.1 (H) 11.5 - 15.5 %   Platelets 376 150 - 400 K/uL   nRBC 0.0 0.0 - 0.2 %  D-dimer, quantitative  Result Value Ref Range   D-Dimer, Quant 3.91 (H) 0.00 - 0.50 ug/mL-FEU  I-Stat beta hCG blood, ED  Result Value Ref Range   I-stat hCG, quantitative <5.0 <5 mIU/mL   Comment 3          Troponin I (High Sensitivity)  Result Value Ref Range   Troponin I (High Sensitivity) 3 <18 ng/L  Troponin I (High Sensitivity)  Result Value Ref Range   Troponin I (High Sensitivity) 4 <18 ng/L   DG Chest 2 View  Result Date: 08/15/2020 CLINICAL DATA:  Chest pain and SOB. EXAM: CHEST - 2 VIEW COMPARISON:  07/25/2018 FINDINGS: The heart size and mediastinal contours are within normal limits. Both lungs are clear. The visualized skeletal  structures are unremarkable. IMPRESSION: No active cardiopulmonary disease. Electronically Signed   By: Kerby Moors M.D.   On: 08/15/2020 13:42   CT Angio Chest PE W and/or Wo Contrast  Result Date: 08/15/2020 CLINICAL DATA:  PE suspected. Positive D-dimer. Left-sided chest pain EXAM: CT ANGIOGRAPHY CHEST WITH CONTRAST TECHNIQUE: Multidetector CT imaging of the chest was performed using the standard protocol during bolus administration of intravenous contrast. Multiplanar CT image reconstructions and MIPs were obtained to evaluate the vascular anatomy. CONTRAST:  52mL OMNIPAQUE IOHEXOL 350 MG/ML SOLN COMPARISON:  02/10/2016 FINDINGS: Cardiovascular: Satisfactory opacification of the pulmonary arteries to the segmental level. No evidence of pulmonary embolism. Aortic atherosclerosis and coronary artery calcifications. Normal heart  size. No pericardial effusion. Mediastinum/Nodes: Bilateral axillary adenopathy is new. Index left axillary node measures 1.7 cm, image 96/6. Index right axillary node measures 1.3 cm, image 96/6. Right retropectoral node measures 1 cm, image 85/6. Small scattered mediastinal lymph nodes are identified. None of these meet CT criteria however for adenopathy. Normal appearance of the thyroid gland. Normal appearance of the esophagus. Lungs/Pleura: Small right pleural effusion and pleural thickening. New from prior exam. No airspace disease. Mild subsegmental atelectasis noted within the anterior left lower lobe. No interstitial edema. No suspicious lung nodule or mass. Upper Abdomen: Small hiatal hernia. No acute abnormality within the imaged portions of the upper abdomen. 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 No acute or suspicious osseous findings. Review of the MIP images confirms the above findings. IMPRESSION: 1. No evidence for acute pulmonary embolus. 2. Bilateral axillary and right retropectoral adenopathy is new from prior exam. Etiology is indeterminate. Differential considerations include reactive adenopathy, lymphoproliferative disorder, or metastatic disease. Clinical correlation is advised. 3. Small right pleural effusion and/or mild pleural thickening. New from prior exam. 4. Aortic atherosclerosis and coronary artery calcifications. 5. Small hiatal hernia. Aortic Atherosclerosis (ICD10-I70.0). Electronically Signed   By: Kerby Moors M.D.   On: 08/15/2020 16:36    EKG Interpretation  Date/Time:  Sunday August 15 2020 12:55:41 EDT Ventricular Rate:  85 PR Interval:  138 QRS Duration: 70 QT Interval:  364 QTC Calculation: 433 R Axis:   57 Text Interpretation: Normal sinus rhythm Normal ECG since last tracing no significant change Confirmed by Daleen Bo [phone]) on 08/15/2020 5:40:42 PM        MDM  Patient received in signout. Please see previous  provider note to include MDM up to this point.  CTA chest shows no evidence for acute pulmonary embolus. Does show bilateral axillary and right retropectoral adenopathy is new from prior exam. Etiology is indeterminate. Differential considerations include reactive adenopathy, lymphoproliferative disorder, or metastatic disease. Also has small hiatal hernia.  Delta troponin negative.  Covid and flu tests are negative.  Reassessed patient and she is pain free.  Discussed CT results with patient. No emergent intervention needed at this time, however will need further outpatient work up for concerning findings. She will need close follow-up with PCP.  Also given information for local pulmonology clinic.  Advised patient to follow-up there as well.  Will cover for possible pneumonia with doxycycline as she is immunocompromised and did report cough and congestion.  The patient appears reasonably screened and/or stabilized for discharge and I doubt any other medical condition or other [facility] requiring further screening, evaluation, or treatment in the ED at this time prior to discharge. The patient is safe for discharge with strict return precautions discussed. Findings and plan of care discussed with supervising physician  Dr. Eulis Foster.    Portions of this note were generated with [employer]. Dictation errors may occur despite best attempts at proofreading.    Barrie Folk, PA-C 08/15/20 1930    Daleen Bo, MD 08/15/20 2036

## 2020-08-15 NOTE — ED Notes (Deleted)
Called MRI to see where pt is in line for MRI

## 2020-08-15 NOTE — Discharge Instructions (Addendum)
CT scan: 1.No evidence for acute pulmonary embolus. 2. Bilateral axillary and right retropectoral adenopathy is new from prior exam. Etiology is indeterminate. Differential considerations include reactive adenopathy, lymphoproliferative disorder, or metastatic disease.  3. Small right pleural effusion and/or mild pleural thickening. New from prior exam. 4. Aortic atherosclerosis and coronary artery calcifications. 5. Small hiatal hernia.  Follow up with your regular doctor for recheck and further work up of your CT scan.  Also given you information to follow up a lung doctor. I have given you information for the local group here in [place]. Call them.  -Prescription sent to pharmacy for doxycycline.  This antibiotic to cover for possible pneumonia.  Take as prescribed.   Return to the emergency room if any new or worsening symptoms.

## 2020-08-15 NOTE — ED Provider Notes (Addendum)
[facility] EMERGENCY DEPARTMENT Provider Note   CSN: [id number] Arrival date & time: 08/15/20  1255     History Chief Complaint  Patient presents with  . Chest Pain    Michele Lang is a 45 y.o. female.  HPI  HPI: A 45 year old patient with a history of hypertension and obesity presents for evaluation of chest pain. Initial onset of pain was more than 6 hours ago. The patient's chest pain is well-localized, is sharp and is not worse with exertion. The patient's chest pain is middle- or left-sided, is not described as heaviness/pressure/tightness and does not radiate to the arms/jaw/neck. The patient does not complain of nausea and denies diaphoresis. The patient has no history of stroke, has no history of peripheral artery disease, has not smoked in the past 90 days, denies any history of treated diabetes, has no relevant family history of coronary artery disease (first degree relative at less than age 47) and has no history of hypercholesterolemia.    45 year old female history of anxiety, rheumatoid arthritis, hypertension, who presents the emergency department today for evaluation of chest pain.  She states that she had a similar episode a week ago with pain to the left side of the chest but she took over-the-counter medications and her symptoms improved.  She states that last night she started having chest pain again located to the mid/left side of the chest.  She describes it as a sharp pain.  She currently rates pain about 3/10.  It is intermittent in nature.  It is worse with certain movements and positions like lifting her arms above her head.  She thinks that she may have pulled a muscle when lifting a heavy trash bag a few days ago this is has happened to her frequently in the past.  She denies any fevers, shortness of breath but does endorse a cough.  Denies any recent bilateral lower extremity swelling or calf pain.  Recently started on Remicade  infusions.  Denies any early family history of heart disease.  Denies tobacco use.  Denies any history of hyperlipidemia or diabetes.  Past Medical History:  Diagnosis Date  . Anxiety   . Arthritis   . Hypertension     Patient Active Problem List   Diagnosis Date Noted  . Chest pain, atypical 11/22/2013  . Essential hypertension, benign 01/04/2013  . Cough 01/04/2013  . Pleural effusion 01/03/2013    Past Surgical History:  Procedure Laterality Date  . BREAST SURGERY    . FOOT SURGERY       OB History    Gravida  4   Para  3   Term  3   Preterm  0   AB  1   Living  3     SAB  1   IAB  0   Ectopic  0   Multiple  0   Live Births              Family History  Problem Relation Age of Onset  . Hypertension Mother   . Heart disease Mother   . Hypertension Father   . Hypertension Sister   . Depression Maternal Uncle   . Depression Maternal Grandmother   . Rheum arthritis Maternal Grandmother   . Asthma Son   . Allergies Son     Social History   Tobacco Use  . Smoking status: Never Smoker  . Smokeless tobacco: Never Used  Vaping Use  . Vaping Use: Never used  Substance Use Topics  . Alcohol use: No  . Drug use: No    Home Medications Prior to Admission medications   Medication Sig Start Date End Date Taking? Authorizing Provider  ENBREL SURECLICK 50 MG/ML injection Inject 50 mg into the skin every Wednesday. 07/18/18   [provider]  folic acid (FOLVITE) 1 MG tablet Take 1 mg by mouth daily.    [provider]  IRBESARTAN PO Take 1 tablet by mouth daily.     [provider]  methotrexate 2.5 MG tablet Take 20 mg by mouth once a week. Take 8 tablets by mouth once a week    [provider]  sucralfate (CARAFATE) 1 g tablet Take 1 tablet (1 g total) by mouth 4 (four) times daily as needed. 07/25/18   Maudie Flakes, MD    Allergies    Penicillins  Review of Systems   Review of Systems   Constitutional: Negative for chills and fever.  HENT: Negative for ear pain and sore throat.   Eyes: Negative for pain and visual disturbance.  Respiratory: Positive for cough. Negative for shortness of breath.   Cardiovascular: Positive for chest pain. Negative for leg swelling.  Gastrointestinal: Negative for abdominal pain, constipation, diarrhea, nausea and vomiting.  Genitourinary: Negative for dysuria and hematuria.  Musculoskeletal: Negative for back pain.  Skin: Negative for rash.  Neurological: Negative for headaches.  All other systems reviewed and are negative.   Physical Exam Updated Vital Signs BP (!) 160/96   Pulse 70   Temp 99.3 F (37.4 C) (Oral)   Resp 16   Ht 5\' 1"  (1.549 m)   Wt 77.1 kg   SpO2 99%   BMI 32.12 kg/m   Physical Exam Vitals and nursing note reviewed.  Constitutional:      General: She is not in acute distress.    Appearance: She is well-developed.  HENT:     Head: Normocephalic and atraumatic.  Eyes:     Conjunctiva/sclera: Conjunctivae normal.  Cardiovascular:     Rate and Rhythm: Normal rate and regular rhythm.     Heart sounds: Murmur heard.    Pulmonary:     Effort: Pulmonary effort is normal. No respiratory distress.     Breath sounds: Examination of the right-lower field reveals rales. Rales present.  Chest:     Chest wall: Tenderness (mid/left chest ttp which reproduces pain) present.  Abdominal:     General: Bowel sounds are normal.     Palpations: Abdomen is soft.     Tenderness: There is no abdominal tenderness. There is no guarding or rebound.  Musculoskeletal:     Cervical back: Neck supple.     Right lower leg: No tenderness. No edema.     Left lower leg: No tenderness. No edema.  Skin:    General: Skin is warm and dry.  Neurological:     Mental Status: She is alert.     ED Results / Procedures / Treatments   Labs (all labs ordered are listed, but only abnormal results are displayed) Labs Reviewed  BASIC  METABOLIC PANEL - Abnormal; Notable for the following components:      Result Value   Sodium 133 (*)    Calcium 8.5 (*)    All other components within normal limits  CBC - Abnormal; Notable for the following components:   Hemoglobin 8.2 (*)    HCT 28.1 (*)    MCV 69.9 (*)    MCH 20.4 (*)  MCHC 29.2 (*)    RDW 19.1 (*)    All other components within normal limits  D-DIMER, QUANTITATIVE - Abnormal; Notable for the following components:   D-Dimer, Quant 3.91 (*)    All other components within normal limits  RESP PANEL BY RT-PCR (FLU A&B, COVID) [id number]  I-STAT BETA HCG BLOOD, ED ([facility], [facility], [facility] ONLY)  I-STAT BETA HCG BLOOD, ED ([facility], [facility], [facility] ONLY)  TROPONIN I (HIGH SENSITIVITY)  TROPONIN I (HIGH SENSITIVITY)    EKG None  Radiology DG Chest 2 View  Result Date: 08/15/2020 CLINICAL DATA:  Chest pain and SOB. EXAM: CHEST - 2 VIEW COMPARISON:  07/25/2018 FINDINGS: The heart size and mediastinal contours are within normal limits. Both lungs are clear. The visualized skeletal structures are unremarkable. IMPRESSION: No active cardiopulmonary disease. Electronically Signed   By: Kerby Moors M.D.   On: 08/15/2020 13:42    Procedures Procedures   Medications Ordered in ED Medications  ketorolac (TORADOL) 30 MG/ML injection 30 mg (30 mg Intravenous Given 08/15/20 1450)    ED Course  I have reviewed the triage vital signs and the nursing notes.  Pertinent labs & imaging results that were available during my care of the patient were reviewed by me and considered in my medical decision making (see chart for details).    MDM Rules/Calculators/A&P HEAR Score: 2                        45 year old female presenting for evaluation of chest pain  Reviewed/interpreted labs CBC with persistent anemia, no leukocytosis BMP is grossly unremarkable Trop negative ddimer is elevated - will get cta chest Beta hcg  EKG with nsr, no acute ischemic changes or arrhythmia  CXR - No active  cardiopulmonary disease.  At shift change, CTA chest is pending. Care transitioned to Theophilus Kinds to f/u on pending result.  Final Clinical Impression(s) / ED Diagnoses Final diagnoses:  None    Rx / DC Orders ED Discharge Orders    None       Rodney Booze, PA-C 08/15/20 1532    Couture, Cortni S, PA-C 08/15/20 1614    Daleen Bo, MD 08/15/20 2036

## 2020-08-15 NOTE — ED Triage Notes (Signed)
Pt reports pain to L chest last week that felt like a pulled muscle.  States pain resolved and returned again last night with SOB and dry cough.

## 2020-08-15 NOTE — ED Notes (Signed)
Pt not in the room at this time.

## 2020-08-15 NOTE — ED Notes (Signed)
Patient returned from X-ray 

## 2020-09-06 ENCOUNTER — Other Ambulatory Visit: Payer: Self-pay

## 2020-09-06 ENCOUNTER — Ambulatory Visit (INDEPENDENT_AMBULATORY_CARE_PROVIDER_SITE_OTHER): Payer: Medicare HMO

## 2020-09-06 ENCOUNTER — Ambulatory Visit (INDEPENDENT_AMBULATORY_CARE_PROVIDER_SITE_OTHER): Payer: Medicare HMO | Admitting: Pulmonary Disease

## 2020-09-06 ENCOUNTER — Encounter: Payer: Self-pay | Admitting: Pulmonary Disease

## 2020-09-06 VITALS — BP 122/76 | HR 94 | Temp 97.8°F | Ht 62.5 in | Wt 160.6 lb

## 2020-09-06 DIAGNOSIS — R590 Localized enlarged lymph nodes: Secondary | ICD-10-CM | POA: Diagnosis not present

## 2020-09-06 DIAGNOSIS — R6889 Other general symptoms and signs: Secondary | ICD-10-CM | POA: Diagnosis not present

## 2020-09-06 DIAGNOSIS — J90 Pleural effusion, not elsewhere classified: Secondary | ICD-10-CM

## 2020-09-06 DIAGNOSIS — R0789 Other chest pain: Secondary | ICD-10-CM | POA: Diagnosis not present

## 2020-09-06 NOTE — Assessment & Plan Note (Addendum)
I note that she had left pleural effusion in 2014 which was around the time of onset of rheumatoid arthritis and was attributed to rheumatoid effusion, that she never underwent thoracentesis and this resolved spontaneously. What we note now is likely related to pleural thickening due to rheumatoid pleural involvement doubt needs further intervention at this time. Will obtain chest x-ray today >> chest x-ray independently reviewed shows no worsening effusion, no further follow-up required

## 2020-09-06 NOTE — Patient Instructions (Addendum)
  The fluid accumulation around the light lung is minimal or it may simply be pleural thickening Chest x-ray today, if this is okay does not need further follow-up.  Your lymph glands in the axilla are enlarged. Suggest mammogram to be scheduled by her PCP. Treat rheumatoid arthritis in your hands and then reassess lymph glands with CT chest And if gone-down, does not need further follow-up

## 2020-09-06 NOTE — Progress Notes (Signed)
Subjective:    Patient ID: Michele Lang, female    DOB: 07-Mar-1976, 45 y.o.   MRN: [id number]  HPI  Chief Complaint  Patient presents with  . Consult    Seen in hospital on 3/27 and is concerned about pleural effusion and swollen lymph nodes.    45 year old never smoker presents for evaluation for cough abnormal CT scan. PMH- She has severe rheumatoid arthritis since 2013, follows with Dr. Eliberto Ivory, was on Enbrel and methotrexate injections in the past and has recently been started on Remicade for 2 weeks starting March 2022. -Osteoarthritis right knee, ambulates with a cane. Iron deficiency anemia -Left pleural effusion 12/2012, felt to be due to rheumatoid arthritis, resolved spontaneously   She developed substernal chest pain nonradiating on 3/27, felt like a muscle pull, improved with resting and taking Tylenol, worse with lifting, pulling heavy objects, bending over.  It would occur especially in the morning lasting a few minutes. She sought ED evaluation, COVID testing was negative, chest x-ray was negative, CT angiogram showed pleural thickening and minimal right effusion, there was axillary adenopathy, mediastinal lymph nodes were not significantly enlarged. Labs showed hemoglobin of 8.2, MCV 70, troponins were negative, EKG was normal. I have personally reviewed about data. She was discharged with doxycycline and pulm evaluation was recommended  She denies weight loss, fevers, cough or hemoptysis. She has not had mammogram or colonoscopy.  There is no family history of breast or lung cancer  Significant tests/ events reviewed  CTA chest 08/15/20 >> small right pleural effusion and pleural thickening.  Bilateral axillary and right recto- pectoral lymphadenopathy  CT angiogram chest 10/2012 moderate left effusion   Past Medical History:  Diagnosis Date  . Anxiety   . Arthritis   . Hypertension    Past Surgical History:  Procedure Laterality Date  . BREAST SURGERY     . FOOT SURGERY      Allergies  Allergen Reactions  . Penicillins Rash    Has patient had a PCN reaction causing immediate rash, facial/tongue/throat swelling, SOB or lightheadedness with hypotension: Yes Has patient had a PCN reaction causing severe rash involving mucus membranes or skin necrosis: Yes Has patient had a PCN reaction that required hospitalization No Has patient had a PCN reaction occurring within the last 10 years: No If all of the above answers are "NO", then may proceed with Cephalosporin use.     Social History   Socioeconomic History  . Marital status: Single    Spouse name: Not on file  . Number of children: Not on file  . Years of education: Not on file  . Highest education level: Not on file  Occupational History  . Not on file  Tobacco Use  . Smoking status: Never Smoker  . Smokeless tobacco: Never Used  Vaping Use  . Vaping Use: Never used  Substance and Sexual Activity  . Alcohol use: No  . Drug use: No  . Sexual activity: Never  Other Topics Concern  . Not on file  Social History Narrative  . Not on file   Social Determinants of Health   Financial Resource Strain: Not on file  Food Insecurity: Not on file  Transportation Needs: Not on file  Physical Activity: Not on file  Stress: Not on file  Social Connections: Not on file  Intimate Partner Violence: Not on file    Family History  Problem Relation Age of Onset  . Hypertension Mother   . Heart disease Mother   .  Hypertension Father   . Hypertension Sister   . Depression Maternal Uncle   . Depression Maternal Grandmother   . Rheum arthritis Maternal Grandmother   . Asthma Son   . Allergies Son      Review of Systems Substernal chest heaviness Loss of appetite, no weight change Anxiety Joint stiffness both hands and feet  Constitutional: negative for anorexia, fevers and sweats  Eyes: negative for irritation, redness and visual disturbance  Ears, nose, mouth, throat,  and face: negative for earaches, epistaxis, nasal congestion and sore throat  Respiratory: negative for cough, dyspnea on exertion, sputum and wheezing  Cardiovascular: negative for  dyspnea, lower extremity edema, orthopnea, palpitations and syncope  Gastrointestinal: negative for abdominal pain, constipation, diarrhea, melena, nausea and vomiting  Genitourinary:negative for dysuria, frequency and hematuria  Hematologic/lymphatic: negative for bleeding, easy bruising and lymphadenopathy  Musculoskeletal:negative for arthralgias, muscle weakness   Neurological: negative for coordination problems, gait problems, headaches and weakness  Endocrine: negative for diabetic symptoms including polydipsia, polyuria and weight loss     Objective:   Physical Exam  Gen. Pleasant, well-nourished, in no distress, normal affect ENT - no pallor,icterus, no post nasal drip Neck: No JVD, no thyromegaly, no carotid bruits Lungs: no use of accessory muscles, no dullness to percussion, clear without rales or rhonchi  Cardiovascular: Rhythm regular, heart sounds  normal, no murmurs or gallops, no peripheral edema Abdomen: soft and non-tender, no hepatosplenomegaly, BS normal. Musculoskeletal: Swelling and deformity of small joints in both hands, no cyanosis or clubbing Neuro:  alert, non focal       Assessment & Plan:

## 2020-09-06 NOTE — Assessment & Plan Note (Signed)
No cardiopulmonary etiology has been identified.  This is unlikely to be pleurisy. More likely that this is related to musculoskeletal, she does not have reproducible chest wall tenderness to indicate costochondritis

## 2020-09-06 NOTE — Assessment & Plan Note (Signed)
She does not have mediastinal lymphadenopathy.  Further work-up of axillary lymphadenopathy would involve mammogram which would be routine in this age group.  This may simply be related to rheumatoid arthritis involving both hands. I would suggest treatment of rheumatoid arthritis and then repeat CT to evaluate axillary lymphadenopathy.  Will defer to her PCP for this.

## 2020-09-09 ENCOUNTER — Telehealth: Payer: Self-pay | Admitting: Pulmonary Disease

## 2020-09-09 NOTE — Telephone Encounter (Signed)
I have called and spoke with pt and she is aware that she does not need a referral to get her mammogram.  They will send the results to her PCP.  Nothing further is needed.

## 2020-09-14 DIAGNOSIS — I10 Essential (primary) hypertension: Secondary | ICD-10-CM | POA: Diagnosis not present

## 2020-09-14 DIAGNOSIS — M0609 Rheumatoid arthritis without rheumatoid factor, multiple sites: Secondary | ICD-10-CM | POA: Diagnosis not present

## 2020-09-14 DIAGNOSIS — D509 Iron deficiency anemia, unspecified: Secondary | ICD-10-CM | POA: Diagnosis not present

## 2020-09-15 ENCOUNTER — Institutional Professional Consult (permissible substitution): Payer: Medicare HMO | Admitting: Pulmonary Disease

## 2020-10-25 DIAGNOSIS — N6323 Unspecified lump in the left breast, lower outer quadrant: Secondary | ICD-10-CM | POA: Diagnosis not present

## 2020-10-25 DIAGNOSIS — R928 Other abnormal and inconclusive findings on diagnostic imaging of breast: Secondary | ICD-10-CM | POA: Diagnosis not present

## 2020-10-25 DIAGNOSIS — R590 Localized enlarged lymph nodes: Secondary | ICD-10-CM | POA: Diagnosis not present

## 2020-10-25 DIAGNOSIS — N6321 Unspecified lump in the left breast, upper outer quadrant: Secondary | ICD-10-CM | POA: Diagnosis not present

## 2021-01-06 DIAGNOSIS — F411 Generalized anxiety disorder: Secondary | ICD-10-CM | POA: Diagnosis not present

## 2021-01-06 DIAGNOSIS — R7303 Prediabetes: Secondary | ICD-10-CM | POA: Diagnosis not present

## 2021-01-06 DIAGNOSIS — E785 Hyperlipidemia, unspecified: Secondary | ICD-10-CM | POA: Diagnosis not present

## 2021-01-06 DIAGNOSIS — E6609 Other obesity due to excess calories: Secondary | ICD-10-CM | POA: Diagnosis not present

## 2021-01-06 DIAGNOSIS — D509 Iron deficiency anemia, unspecified: Secondary | ICD-10-CM | POA: Diagnosis not present

## 2021-01-06 DIAGNOSIS — M069 Rheumatoid arthritis, unspecified: Secondary | ICD-10-CM | POA: Diagnosis not present

## 2021-01-06 DIAGNOSIS — I10 Essential (primary) hypertension: Secondary | ICD-10-CM | POA: Diagnosis not present

## 2021-01-06 DIAGNOSIS — M05561 Rheumatoid polyneuropathy with rheumatoid arthritis of right knee: Secondary | ICD-10-CM | POA: Diagnosis not present

## 2021-01-06 DIAGNOSIS — M0609 Rheumatoid arthritis without rheumatoid factor, multiple sites: Secondary | ICD-10-CM | POA: Diagnosis not present

## 2021-02-10 DIAGNOSIS — F331 Major depressive disorder, recurrent, moderate: Secondary | ICD-10-CM | POA: Diagnosis not present

## 2021-02-10 DIAGNOSIS — F411 Generalized anxiety disorder: Secondary | ICD-10-CM | POA: Diagnosis not present

## 2021-02-24 DIAGNOSIS — F331 Major depressive disorder, recurrent, moderate: Secondary | ICD-10-CM | POA: Diagnosis not present

## 2021-03-07 DIAGNOSIS — R262 Difficulty in walking, not elsewhere classified: Secondary | ICD-10-CM | POA: Diagnosis not present

## 2021-03-07 DIAGNOSIS — Z9181 History of falling: Secondary | ICD-10-CM | POA: Diagnosis not present

## 2021-03-07 DIAGNOSIS — R2689 Other abnormalities of gait and mobility: Secondary | ICD-10-CM | POA: Diagnosis not present

## 2021-03-07 DIAGNOSIS — M6281 Muscle weakness (generalized): Secondary | ICD-10-CM | POA: Diagnosis not present

## 2021-03-10 DIAGNOSIS — F331 Major depressive disorder, recurrent, moderate: Secondary | ICD-10-CM | POA: Diagnosis not present

## 2021-05-31 DIAGNOSIS — R69 Illness, unspecified: Secondary | ICD-10-CM | POA: Diagnosis not present

## 2021-07-05 DIAGNOSIS — Z1159 Encounter for screening for other viral diseases: Secondary | ICD-10-CM | POA: Diagnosis not present

## 2021-07-05 DIAGNOSIS — M069 Rheumatoid arthritis, unspecified: Secondary | ICD-10-CM | POA: Diagnosis not present

## 2021-07-05 DIAGNOSIS — M0579 Rheumatoid arthritis with rheumatoid factor of multiple sites without organ or systems involvement: Secondary | ICD-10-CM | POA: Diagnosis not present

## 2021-07-05 DIAGNOSIS — D509 Iron deficiency anemia, unspecified: Secondary | ICD-10-CM | POA: Diagnosis not present

## 2021-08-08 DIAGNOSIS — M0579 Rheumatoid arthritis with rheumatoid factor of multiple sites without organ or systems involvement: Secondary | ICD-10-CM | POA: Diagnosis not present

## 2021-09-29 DIAGNOSIS — L299 Pruritus, unspecified: Secondary | ICD-10-CM | POA: Diagnosis not present

## 2021-09-29 DIAGNOSIS — T7840XA Allergy, unspecified, initial encounter: Secondary | ICD-10-CM | POA: Diagnosis not present

## 2021-09-29 DIAGNOSIS — Z743 Need for continuous supervision: Secondary | ICD-10-CM | POA: Diagnosis not present

## 2021-09-29 DIAGNOSIS — T886XXA Anaphylactic reaction due to adverse effect of correct drug or medicament properly administered, initial encounter: Secondary | ICD-10-CM | POA: Diagnosis not present

## 2021-09-29 DIAGNOSIS — M0579 Rheumatoid arthritis with rheumatoid factor of multiple sites without organ or systems involvement: Secondary | ICD-10-CM | POA: Diagnosis not present

## 2021-09-29 DIAGNOSIS — T782XXA Anaphylactic shock, unspecified, initial encounter: Secondary | ICD-10-CM | POA: Diagnosis not present

## 2021-09-29 DIAGNOSIS — X58XXXA Exposure to other specified factors, initial encounter: Secondary | ICD-10-CM | POA: Diagnosis not present

## 2021-09-29 DIAGNOSIS — L509 Urticaria, unspecified: Secondary | ICD-10-CM | POA: Diagnosis not present

## 2021-09-29 DIAGNOSIS — Y999 Unspecified external cause status: Secondary | ICD-10-CM | POA: Diagnosis not present

## 2021-09-29 DIAGNOSIS — T394X5A Adverse effect of antirheumatics, not elsewhere classified, initial encounter: Secondary | ICD-10-CM | POA: Diagnosis not present

## 2021-11-08 DIAGNOSIS — M0579 Rheumatoid arthritis with rheumatoid factor of multiple sites without organ or systems involvement: Secondary | ICD-10-CM | POA: Diagnosis not present

## 2021-11-08 DIAGNOSIS — Z79899 Other long term (current) drug therapy: Secondary | ICD-10-CM | POA: Diagnosis not present

## 2021-11-08 HISTORY — DX: Other long term (current) drug therapy: Z79.899

## 2022-01-15 IMAGING — DX DG CHEST 2V
2 series · 2 of 2 positions shown · non-contrast
Comparison: 07/25/2018

CLINICAL DATA: Chest pain and SOB.

EXAM:
CHEST - 2 VIEW

[chest pa]
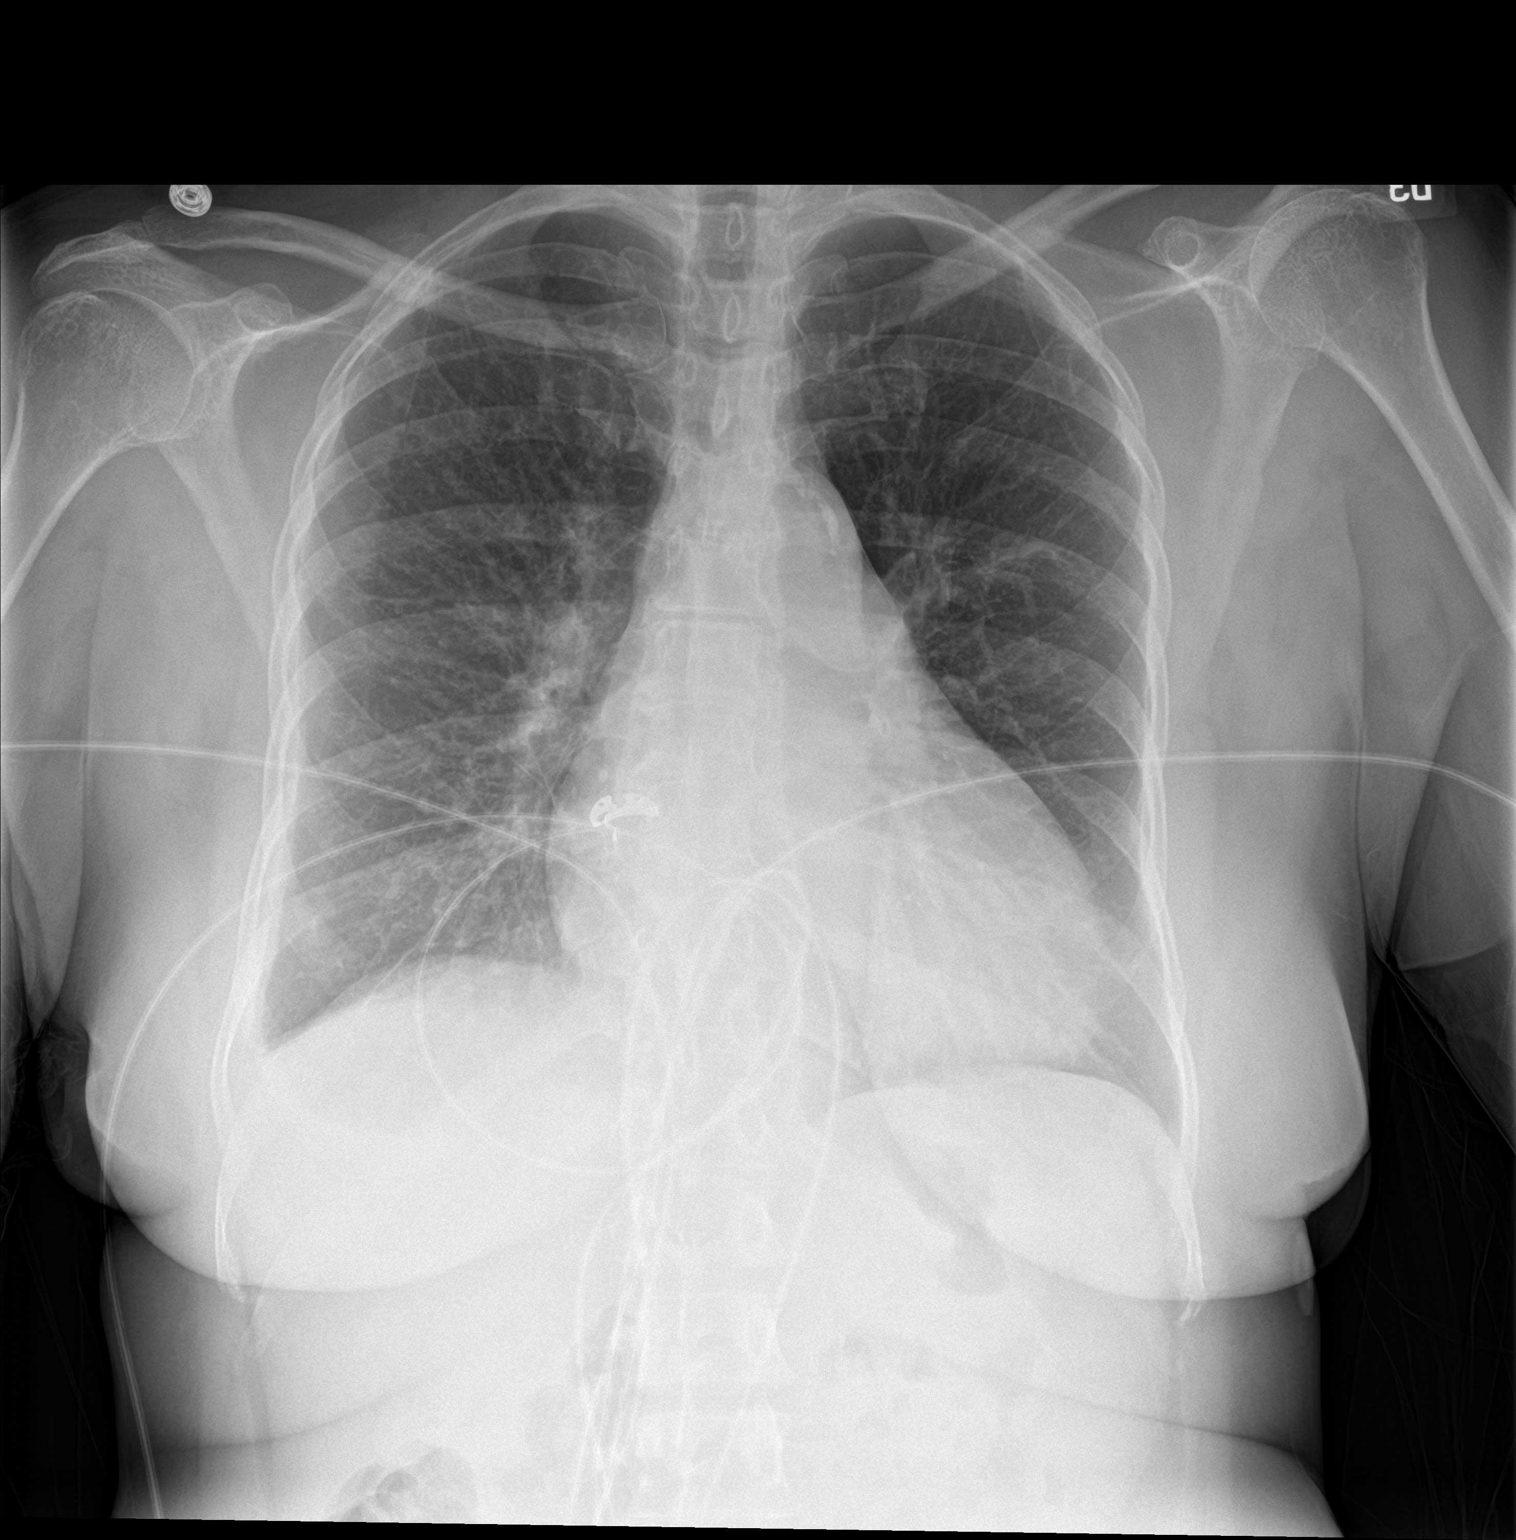

[chest lat]
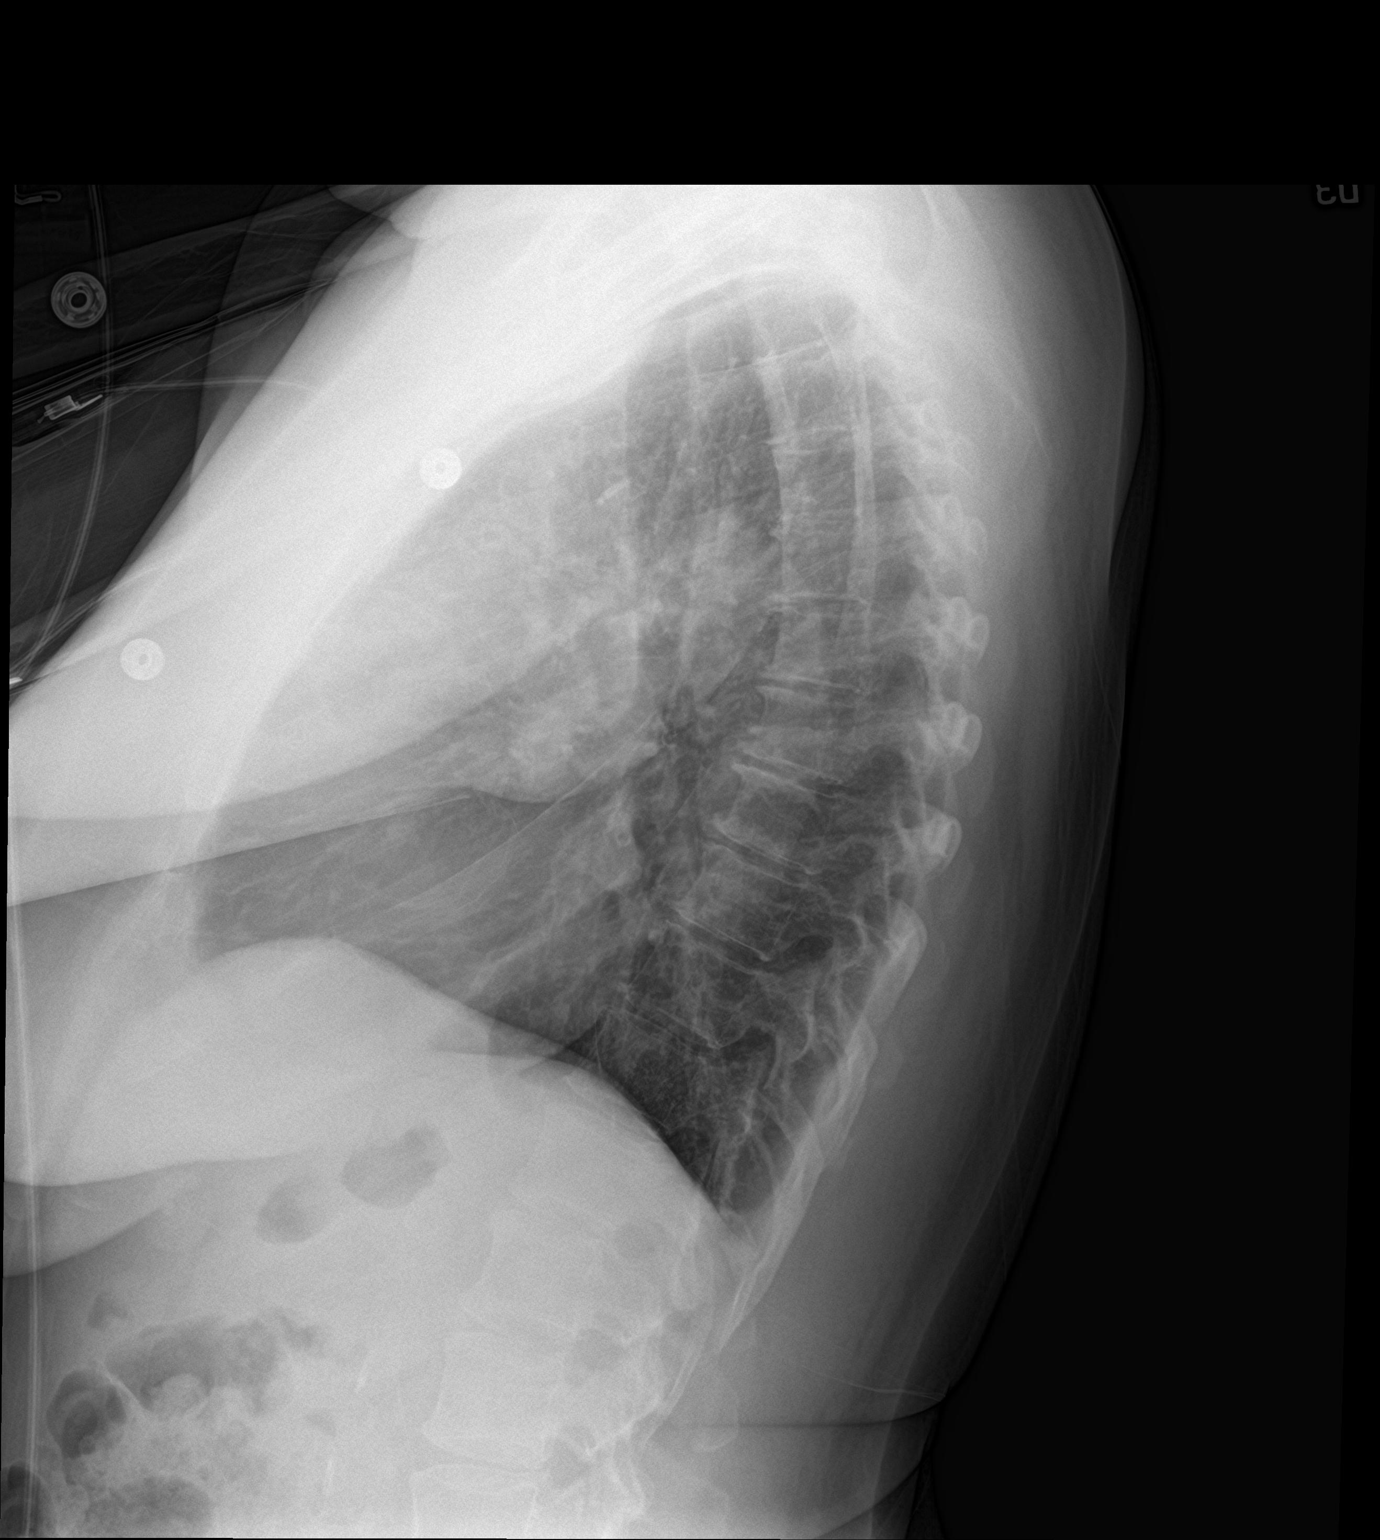

[2 of 2 positions shown; findings below may reference images not displayed]

FINDINGS: The heart size and mediastinal contours are within normal limits.
Both lungs are clear. The visualized skeletal structures are
unremarkable.
IMPRESSION: No active cardiopulmonary disease.

## 2022-01-15 IMAGING — CT CT ANGIO CHEST
2 of 7 series · 18 of 46 positions shown · IV contrast (APPLIED)
Comparison: 02/10/2016

CLINICAL DATA: PE suspected. Positive D-dimer. Left-sided chest
pain

EXAM:
CT ANGIOGRAPHY CHEST WITH CONTRAST
TECHNIQUE: Multidetector CT imaging of the chest was performed using the
standard protocol during bolus administration of intravenous
contrast. Multiplanar CT image reconstructions and MIPs were
obtained to evaluate the vascular anatomy.
CONTRAST:  75mL OMNIPAQUE IOHEXOL 350 MG/ML SOLN

[Series 6: thins · axial · 0.67mm/px · z∈[-384,-122]mm · 15 of 422 slices shown]
[im 24/422  lung]
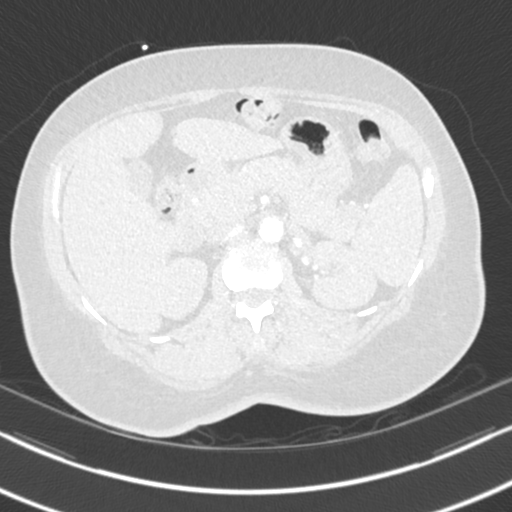
[im 47/422  soft-tissue]
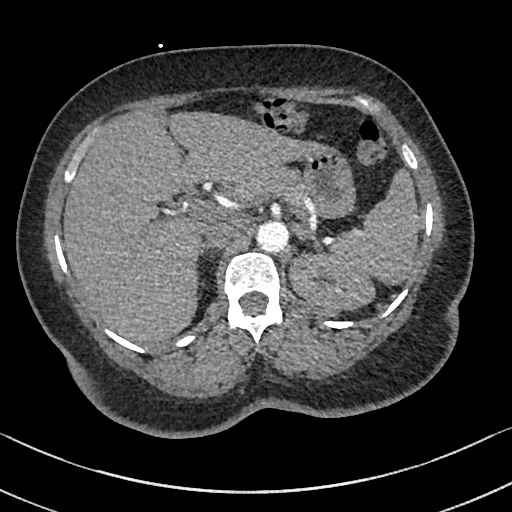
[im 71/422  lung]
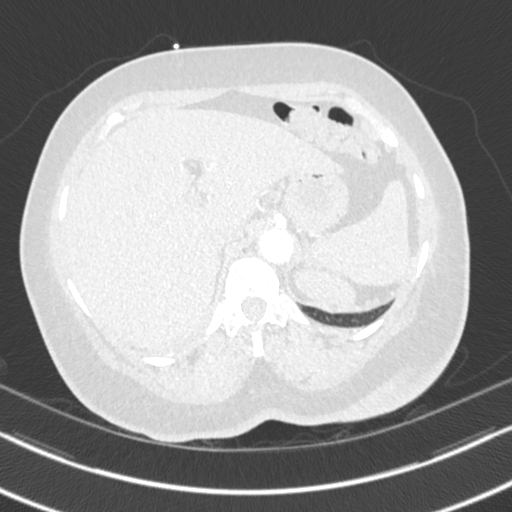
[im 94/422  soft-tissue]
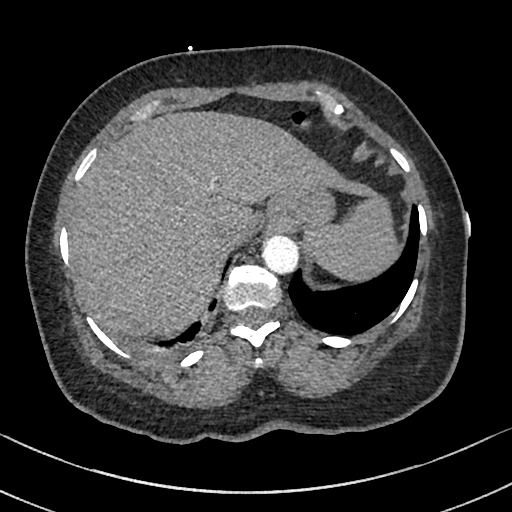
[im 141/422  lung]
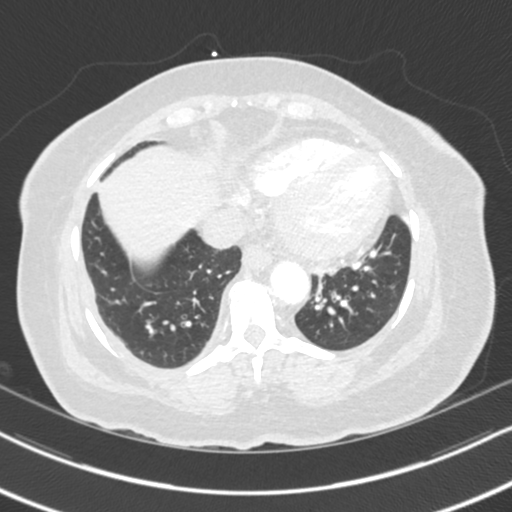
[im 164/422  soft-tissue]
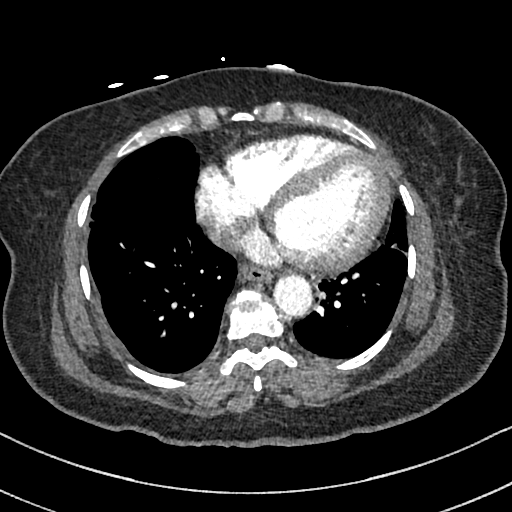
[im 188/422  lung]
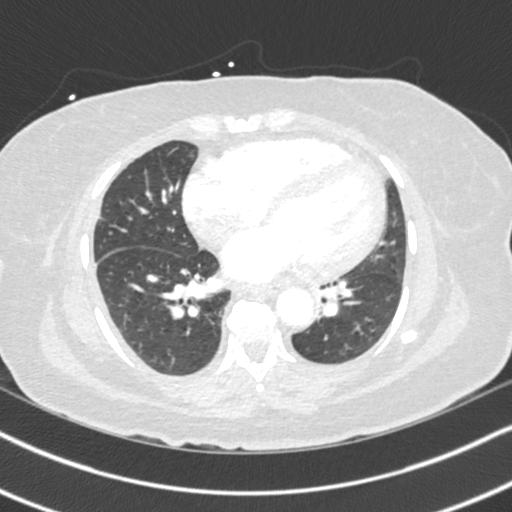
[im 211/422  soft-tissue]
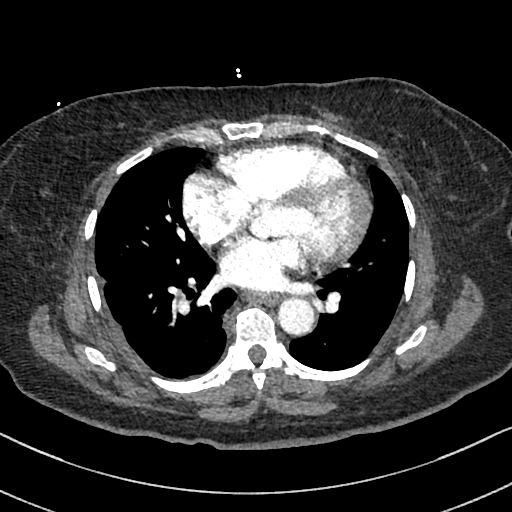
[im 234/422  lung]
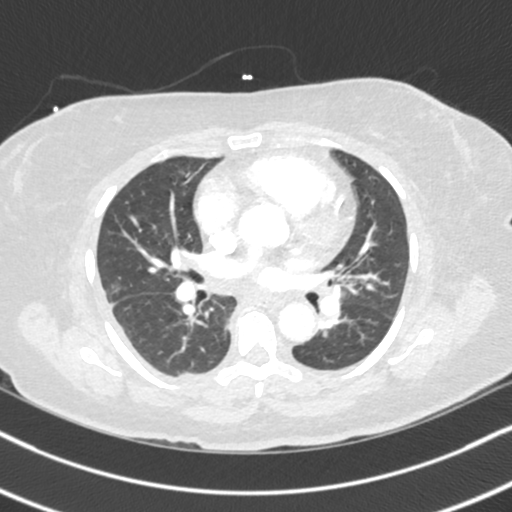
[im 258/422  soft-tissue]
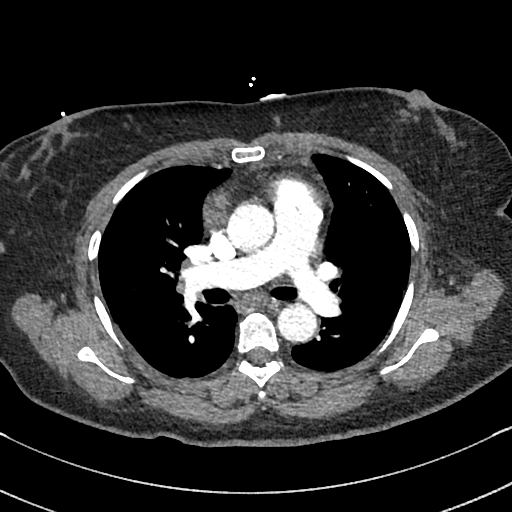
[im 281/422  lung]
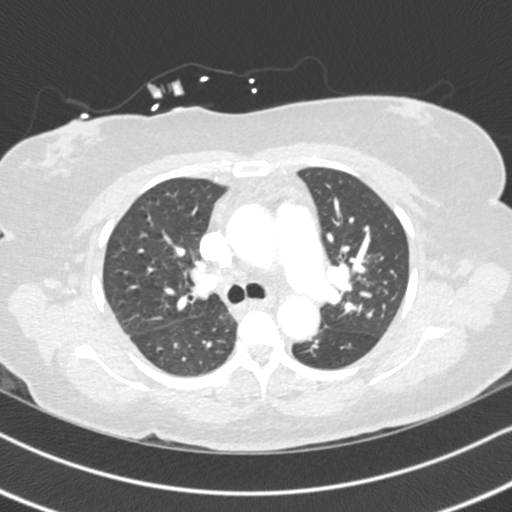
[im 328/422  soft-tissue]
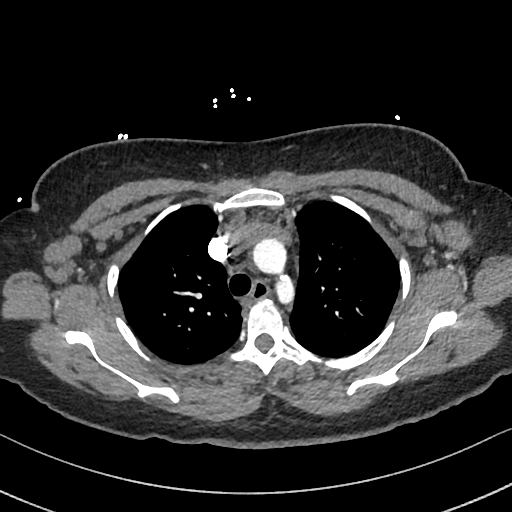
[im 351/422  lung]
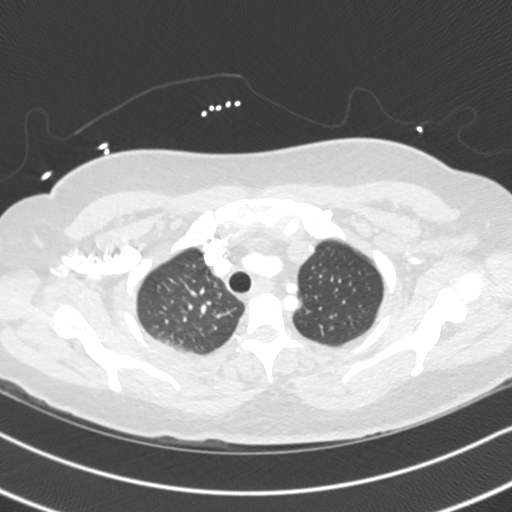
[im 375/422  soft-tissue]
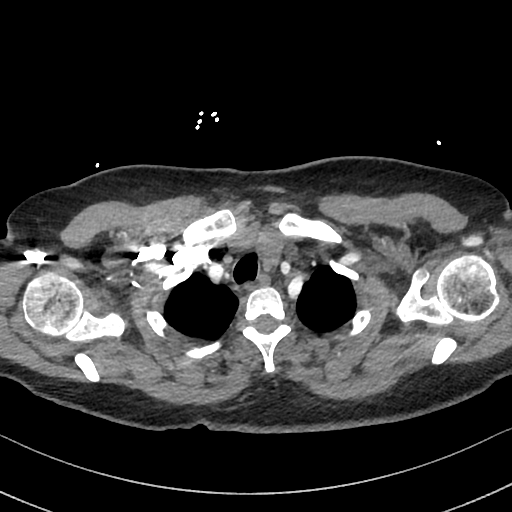
[im 398/422  lung]
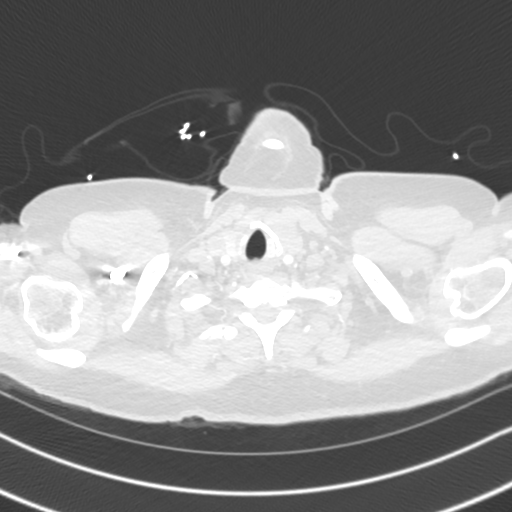

[Series 8: cor · coronal · 0.62mm/px · 3 of 137 slices shown]
[im 35/137  soft-tissue]
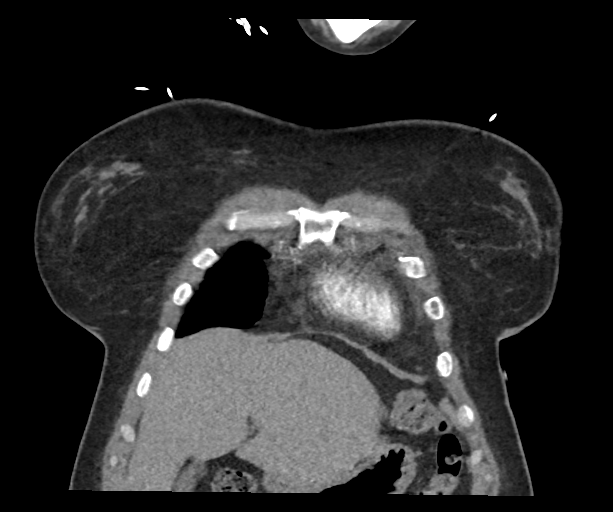
[im 69/137  soft-tissue]
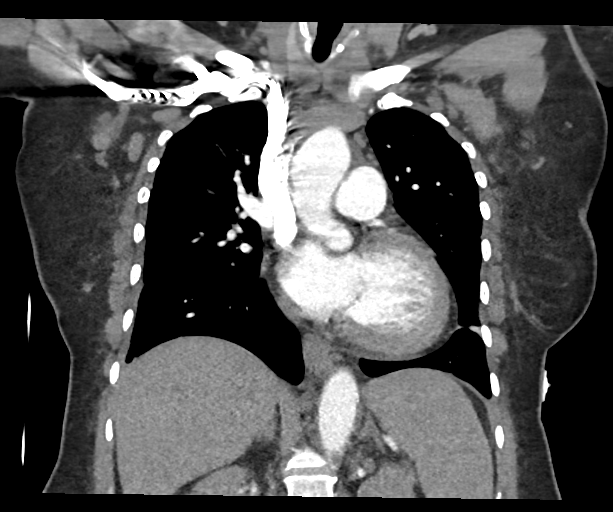
[im 103/137  soft-tissue]
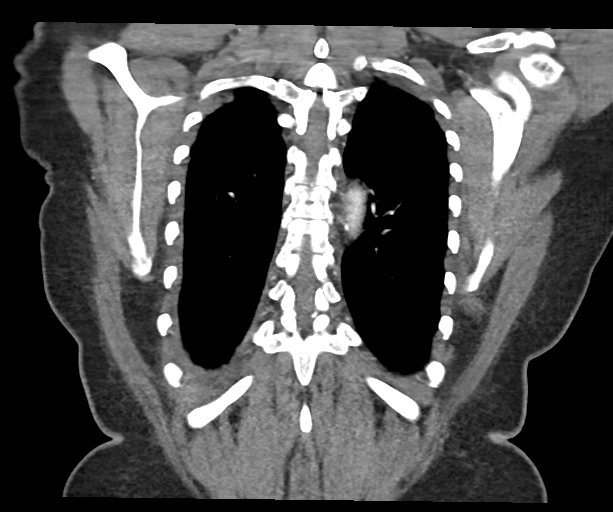

[18 of 46 positions shown; findings below may reference images not displayed]

FINDINGS: Cardiovascular: Satisfactory opacification of the pulmonary arteries
to the segmental level. No evidence of pulmonary embolism.

Aortic atherosclerosis and coronary artery calcifications. Normal
heart size. No pericardial effusion.

Mediastinum/Nodes: Bilateral axillary adenopathy is new. Index left
axillary node measures 1.7 cm, image 96/6. Index right axillary node
measures 1.3 cm, image 96/6.

Right retropectoral node measures 1 cm, image 85/6. Small scattered
mediastinal lymph nodes are identified. None of these meet CT
criteria however for adenopathy.

Normal appearance of the thyroid gland. Normal appearance of the
esophagus.

Lungs/Pleura: Small right pleural effusion and pleural thickening.
New from prior exam. No airspace disease. Mild subsegmental
atelectasis noted within the anterior left lower lobe. No
interstitial edema. No suspicious lung nodule or mass.

Upper Abdomen: Small hiatal hernia. No acute abnormality within the
imaged portions of the upper abdomen.

Musculoskeletal: Degenerative disc disease noted within the thoracic
spine. No acute or suspicious osseous findings.

Review of the MIP images confirms the above findings.
IMPRESSION: 1. No evidence for acute pulmonary embolus.
2. Bilateral axillary and right retropectoral adenopathy is new from
prior exam. Etiology is indeterminate. Differential considerations
include reactive adenopathy, lymphoproliferative disorder, or
metastatic disease. Clinical correlation is advised.
3. Small right pleural effusion and/or mild pleural thickening. New
from prior exam.
4. Aortic atherosclerosis and coronary artery calcifications.
5. Small hiatal hernia.

Aortic Atherosclerosis ([id number]-96P.P).

## 2022-01-24 ENCOUNTER — Ambulatory Visit: Payer: Medicare Other | Attending: Internal Medicine | Admitting: Internal Medicine

## 2022-01-24 ENCOUNTER — Encounter: Payer: Self-pay | Admitting: Internal Medicine

## 2022-01-24 VITALS — BP 144/90 | HR 76 | Temp 98.5°F | Ht 62.0 in | Wt 179.6 lb

## 2022-01-24 DIAGNOSIS — D509 Iron deficiency anemia, unspecified: Secondary | ICD-10-CM | POA: Diagnosis not present

## 2022-01-24 DIAGNOSIS — Z7689 Persons encountering health services in other specified circumstances: Secondary | ICD-10-CM

## 2022-01-24 DIAGNOSIS — Z114 Encounter for screening for human immunodeficiency virus [HIV]: Secondary | ICD-10-CM

## 2022-01-24 DIAGNOSIS — Z1159 Encounter for screening for other viral diseases: Secondary | ICD-10-CM | POA: Diagnosis not present

## 2022-01-24 DIAGNOSIS — Z1211 Encounter for screening for malignant neoplasm of colon: Secondary | ICD-10-CM | POA: Diagnosis not present

## 2022-01-24 DIAGNOSIS — M0579 Rheumatoid arthritis with rheumatoid factor of multiple sites without organ or systems involvement: Secondary | ICD-10-CM | POA: Diagnosis not present

## 2022-01-24 DIAGNOSIS — I10 Essential (primary) hypertension: Secondary | ICD-10-CM | POA: Diagnosis not present

## 2022-01-24 DIAGNOSIS — Z23 Encounter for immunization: Secondary | ICD-10-CM

## 2022-01-24 DIAGNOSIS — F411 Generalized anxiety disorder: Secondary | ICD-10-CM | POA: Diagnosis not present

## 2022-01-24 MED ORDER — AMLODIPINE BESYLATE 10 MG PO TABS: 10.0000 mg | ORAL_TABLET | Freq: Every day | ORAL | 6 refills | Status: DC

## 2022-01-24 MED ORDER — BUSPIRONE HCL 5 MG PO TABS: 5.0000 mg | ORAL_TABLET | Freq: Two times a day (BID) | ORAL | 2 refills | Status: DC

## 2022-01-24 NOTE — Progress Notes (Signed)
Patient ID: Michele Lang, female    DOB: November 03, 1975  MRN: [id number]  CC: New Patient (Initial Visit)   Subjective: Michele Lang is a 46 y.o. female who presents for new patient visit Her concerns today include:  Patient with history of HTN, RA with positive RA factor, anxiety,  Previous PCP was at the [facility]. Decided to change because she was not pleased with the care she was receiving.  Last seen 2/22023. Gives hx of HTN, anxiety, RA, iron def anemia, OA RT knee  HTN: on Norvasc 10 mg daily.  She was on Clonidine patch as well but stopped it a few mths ago because she was having pain in the arm. Out Norvasc x 3 days Has device but has not checked BP in a while Tries to limit salt in foods No CP/SOB/LE edema  RA:  followed by PA. Belfi at [facility] rheumatology. Last seen 11/08/2021 I was able to review note and labs from that visit on Care Everywhere.  CRP was elevated at 12.9, chemistry revealed GFR of greater than 90 and creatinine 0.75.  LFTs were normal. So far she is doing okay on Xejanze.  On it x 1 mth.  On Prednisone 10 mg daily for the past 2 mths. Not on CA+ + Vit D supplement.   Last dexa was 2020 per rheumatology note  Anemia:  she has been anemic for quite a number of yrs. Started on iron med in 06/2021 when she last saw PCP.  She takes it every other day.  Last CBC 10/2021 at [facility] 9/28.6 with MCV 67.4 -Menses regular and usually light to moderate bleeding, lasting 7 days.  Not much cramps and no clots.  No blood in stools. No dizziness or fatigue  Never had any form of colon CA screen -referred to hematology by rheumatology .  Appt scheduled for 03/13/2022  Anxiety:  was on Xanax through her previous PCP.  Out x few mths.   Feels she may need to be back on med because she has been having more flare of her anxiety due to her RA being as bad as it is.   HM:  Never had colon CA screening.  Last pap was 2 yrs ago at HD.  Had MMG last yr at [facility].   Agrees to receive flu shot today. Patient Active Problem List   Diagnosis Date Noted   Axillary lymphadenopathy 09/06/2020   Chest pain, atypical 11/22/2013   Essential hypertension, benign 01/04/2013   Cough 01/04/2013   Pleural effusion 01/03/2013     Current Outpatient Medications on File Prior to Visit  Medication Sig Dispense Refill   ALPRAZolam (XANAX) 0.5 MG tablet Take 0.5 mg by mouth 2 (two) times daily.     cloNIDine (CATAPRES) 0.1 MG tablet Take 0.1 mg by mouth 2 (two) times daily.     ferrous sulfate 325 (65 FE) MG tablet Take 325 mg by mouth daily with breakfast.     IRBESARTAN PO Take 1 tablet by mouth daily.      No current facility-administered medications on file prior to visit.    Allergies  Allergen Reactions   Penicillins Rash    Has patient had a PCN reaction causing immediate rash, facial/tongue/throat swelling, SOB or lightheadedness with hypotension: Yes Has patient had a PCN reaction causing severe rash involving mucus membranes or skin necrosis: Yes Has patient had a PCN reaction that required hospitalization No Has patient had a PCN reaction occurring within the  last 10 years: No If all of the above answers are "NO", then may proceed with Cephalosporin use.     Social History   Socioeconomic History   Marital status: Single    Spouse name: Not on file   Number of children: Not on file   Years of education: Not on file   Highest education level: Not on file  Occupational History   Not on file  Tobacco Use   Smoking status: Never   Smokeless tobacco: Never  Vaping Use   Vaping Use: Never used  Substance and Sexual Activity   Alcohol use: No   Drug use: No   Sexual activity: Never  Other Topics Concern   Not on file  Social History Narrative   Not on file   Social Determinants of Health   Financial Resource Strain: Not on file  Food Insecurity: Not on file  Transportation Needs: Not on file  Physical Activity: Not on file  Stress:  Not on file  Social Connections: Not on file  Intimate Partner Violence: Not on file    Family History  Problem Relation Age of Onset   Hypertension Mother    Heart disease Mother    Hypertension Father    Hypertension Sister    Depression Maternal Uncle    Depression Maternal Grandmother    Rheum arthritis Maternal Grandmother    Asthma Son    Allergies Son     Past Surgical History:  Procedure Laterality Date   BREAST SURGERY     FOOT SURGERY      ROS: Review of Systems Negative except as stated above  PHYSICAL EXAM: BP (!) 144/90   Pulse 76   Temp 98.5 F (36.9 C) (Oral)   Ht '5\' 2"'$  (1.575 m)   Wt 179 lb 9.6 oz (81.5 kg)   SpO2 98%   BMI 32.85 kg/m   Physical Exam BP 150/100 General appearance - alert, well appearing, middle-age African-American female and in no distress Mental status - normal mood, behavior, speech, dress, motor activity, and thought processes Eyes -slightly pale conjunctiva Mouth - mucous membranes moist, pharynx normal without lesions Neck - supple, no significant adenopathy Chest - clear to auscultation, no wheezes, rales or rhonchi, symmetric air entry Heart - normal rate, regular rhythm, normal S1, S2, no murmurs, rubs, clicks or gallops Musculoskeletal -patient ambulates with a cane.  She has valgum deformity of the right knee.  She has deformity of the fingers especially flexion deformity of some of the PIP joints. Extremities -no lower extremity edema.      Latest Ref Rng & Units 08/15/2020    1:09 PM 08/01/2018    5:20 PM 07/25/2018    7:21 PM  CMP  Glucose 70 - 99 mg/dL 85  105  102   BUN 6 - 20 mg/dL '8  15  11   '$ Creatinine 0.44 - 1.00 mg/dL 0.59  0.74  0.86   Sodium 135 - 145 mmol/L 133  134  137   Potassium 3.5 - 5.1 mmol/L 3.7  3.8  3.6   Chloride 98 - 111 mmol/L 102  102  102   CO2 22 - 32 mmol/L '24  25  30   '$ Calcium 8.9 - 10.3 mg/dL 8.5  8.9  8.8   Total Protein 6.5 - 8.1 g/dL  7.8  7.5   Total Bilirubin 0.3 - 1.2 mg/dL   0.5  0.2   Alkaline Phos 38 - 126 U/L  58  55  AST 15 - 41 U/L  17  30   ALT 0 - 44 U/L  13  19    CBC    Component Value Date/Time   WBC 5.9 08/15/2020 1309   RBC 4.02 08/15/2020 1309   HGB 8.2 (L) 08/15/2020 1309   HCT 28.1 (L) 08/15/2020 1309   PLT 376 08/15/2020 1309   MCV 69.9 (L) 08/15/2020 1309   MCH 20.4 (L) 08/15/2020 1309   MCHC 29.2 (L) 08/15/2020 1309   RDW 19.1 (H) 08/15/2020 1309   LYMPHSABS 1.7 07/25/2018 1921   MONOABS 0.0 (L) 07/25/2018 1921   EOSABS 0.2 07/25/2018 1921   BASOSABS 0.1 07/25/2018 1921    ASSESSMENT AND PLAN: 1. Establishing care with new doctor, encounter for We will have her sign a release for [place] to get Pap smear report from the health department and mammogram report from [facility] mammography.  We will also have her sign a release for [place] to get her records from [place] clinic.  2. Essential hypertension Not at goal.  However she has been out of amlodipine for a few weeks.  Refill given.  DASH diet advised and encouraged - amLODipine (NORVASC) 10 MG tablet; Take 1 tablet (10 mg total) by mouth daily.  Dispense: 30 tablet; Refill: 6  3. Iron deficiency anemia, unspecified iron deficiency anemia type We will recheck CBC today with iron studies.  She may need to take the iron supplement daily instead of every other day. - Iron, TIBC and Ferritin Panel - CBC  4. GAD (generalized anxiety disorder) Advised patient that I usually do not prescribe benzodiazepines for long-term use as they can become habit-forming.  She is agreeable to trying BuSpar instead. - busPIRone (BUSPAR) 5 MG tablet; Take 1 tablet (5 mg total) by mouth 2 (two) times daily. For anxiety  Dispense: 60 tablet; Refill: 2  5. Rheumatoid arthritis involving multiple sites with positive rheumatoid factor ([place]) Plugged in with rheumatology at [facility].  She is on [place] and prednisone.  I recommend taking calcium and vitamin D supplement because of the long-term use of  prednisone.    6. Screening for colon cancer Discussed colon cancer screening methods.  Given that she has iron deficiency anemia which may be due to her menstrual cycle or may not, I recommend colonoscopy instead of Cologuard.  Patient agreeable to this. - Ambulatory referral to Gastroenterology  7. Need for hepatitis C screening test Patient agreeable to screening. - Hepatitis C Antibody  8. Screening for HIV (human immunodeficiency virus) Patient agreeable to screening. - HIV antibody (with reflex)  9. Need for immunization against influenza - Flu Vaccine QUAD 55moIM (Fluarix, Fluzone & Alfiuria Quad PF)  Patient was given the opportunity to ask questions.  Patient verbalized understanding of the plan and was able to repeat key elements of the plan.   This documentation was completed using DRadio producer  Any transcriptional errors are unintentional.  No orders of the defined types were placed in this encounter.    Requested Prescriptions    No prescriptions requested or ordered in this encounter    No follow-ups on file.  DKarle Plumber MD, FACP

## 2022-01-24 NOTE — Progress Notes (Signed)
Medication refill

## 2022-01-25 ENCOUNTER — Other Ambulatory Visit: Payer: Self-pay | Admitting: Internal Medicine

## 2022-01-25 ENCOUNTER — Ambulatory Visit: Payer: Self-pay

## 2022-01-25 LAB — IRON,TIBC AND FERRITIN PANEL
Ferritin: 27 ng/mL (ref 15–150)
Iron Saturation: 10 % — ABNORMAL LOW (ref 15–55)
Iron: 39 ug/dL (ref 27–159)
Total Iron Binding Capacity: 381 ug/dL (ref 250–450)
UIBC: 342 ug/dL (ref 131–425)

## 2022-01-25 LAB — CBC
Hematocrit: 33.7 % — ABNORMAL LOW (ref 34.0–46.6)
Hemoglobin: 9.9 g/dL — ABNORMAL LOW (ref 11.1–15.9)
MCH: 21.3 pg — ABNORMAL LOW (ref 26.6–33.0)
MCHC: 29.4 g/dL — ABNORMAL LOW (ref 31.5–35.7)
MCV: 73 fL — ABNORMAL LOW (ref 79–97)
Platelets: 332 10*3/uL (ref 150–450)
RBC: 4.64 x10E6/uL (ref 3.77–5.28)
RDW: 19.1 % — ABNORMAL HIGH (ref 11.7–15.4)
WBC: 7.3 10*3/uL (ref 3.4–10.8)

## 2022-01-25 LAB — HIV ANTIBODY (ROUTINE TESTING W REFLEX): HIV Screen 4th Generation wRfx: NONREACTIVE

## 2022-01-25 LAB — HEPATITIS C ANTIBODY: Hep C Virus Ab: NONREACTIVE

## 2022-01-25 MED ORDER — FERROUS SULFATE 325 (65 FE) MG PO TBEC: 325.0000 mg | DELAYED_RELEASE_TABLET | Freq: Every day | ORAL | 0 refills | Status: DC

## 2022-01-25 MED ORDER — FERROUS SULFATE 325 (65 FE) MG PO TBEC
325.0000 mg | DELAYED_RELEASE_TABLET | Freq: Every day | ORAL | 0 refills | Status: DC
Start: 2022-01-25 — End: 2022-01-25

## 2022-01-25 NOTE — Telephone Encounter (Signed)
[facility] called and spoke to Gerald Stabs, Programmer, multimedia about the refill(s) ferrous sulfate. He says the instructions are to take 1 in the morning with breakfast and 1 QOD, needs clarification on which instructions are accurate. Advised I will send this to the provider for clarification.   Summary: Clarification on Ferrous Sulfate prescription   Apolonio Schneiders with [facility] called in stating the pharmacy needs clarification on the Ferrous Sulfate prescription the provider wrote the patient yesterday. They need to know if it is one everyday at breakfast or 1 every other day because the prescription states both. Please assist further.      Reason for Disposition  [1] Caller has NON-URGENT medicine question about med that PCP prescribed AND [2] triager unable to answer question  Answer Assessment - Initial Assessment Questions 1. NAME of MEDICINE: "What medicine(s) are you calling about?"     Ferrous Sulfate 2. QUESTION: "What is your question?" (e.g., double dose of medicine, side effect)     Take it daily with breakfast or QOD, both are on the Rx 3. PRESCRIBER: "Who prescribed the medicine?" Reason: if prescribed by specialist, call should be referred to that group.     Dr. Wynetta Emery  Protocols used: Medication Question Call-A-AH

## 2022-01-25 NOTE — Telephone Encounter (Signed)
Routing to CMA 

## 2022-01-25 NOTE — Telephone Encounter (Signed)
Noted. Thanks.-----DD,RMA

## 2022-01-25 NOTE — Telephone Encounter (Signed)
Please advise.-----DD,RMA

## 2022-02-24 ENCOUNTER — Ambulatory Visit: Payer: Medicare Other | Admitting: Pharmacist

## 2022-03-02 DIAGNOSIS — Z79899 Other long term (current) drug therapy: Secondary | ICD-10-CM | POA: Diagnosis not present

## 2022-03-02 DIAGNOSIS — M0579 Rheumatoid arthritis with rheumatoid factor of multiple sites without organ or systems involvement: Secondary | ICD-10-CM | POA: Diagnosis not present

## 2022-03-02 DIAGNOSIS — D649 Anemia, unspecified: Secondary | ICD-10-CM | POA: Diagnosis not present

## 2022-03-16 DIAGNOSIS — T454X5A Adverse effect of iron and its compounds, initial encounter: Secondary | ICD-10-CM | POA: Diagnosis not present

## 2022-03-16 DIAGNOSIS — K5903 Drug induced constipation: Secondary | ICD-10-CM | POA: Diagnosis not present

## 2022-03-16 DIAGNOSIS — D509 Iron deficiency anemia, unspecified: Secondary | ICD-10-CM | POA: Diagnosis not present

## 2022-03-16 DIAGNOSIS — M069 Rheumatoid arthritis, unspecified: Secondary | ICD-10-CM | POA: Diagnosis not present

## 2022-03-16 DIAGNOSIS — E538 Deficiency of other specified B group vitamins: Secondary | ICD-10-CM | POA: Diagnosis not present

## 2022-03-16 DIAGNOSIS — E611 Iron deficiency: Secondary | ICD-10-CM | POA: Insufficient documentation

## 2022-03-16 HISTORY — DX: Iron deficiency: E61.1

## 2022-03-20 DIAGNOSIS — E538 Deficiency of other specified B group vitamins: Secondary | ICD-10-CM | POA: Insufficient documentation

## 2022-03-20 HISTORY — DX: Deficiency of other specified B group vitamins: E53.8

## 2022-03-24 DIAGNOSIS — D509 Iron deficiency anemia, unspecified: Secondary | ICD-10-CM | POA: Diagnosis not present

## 2022-03-29 ENCOUNTER — Other Ambulatory Visit: Payer: Self-pay | Admitting: Internal Medicine

## 2022-03-29 DIAGNOSIS — F411 Generalized anxiety disorder: Secondary | ICD-10-CM

## 2022-03-30 DIAGNOSIS — E538 Deficiency of other specified B group vitamins: Secondary | ICD-10-CM | POA: Diagnosis not present

## 2022-04-07 ENCOUNTER — Ambulatory Visit: Payer: Medicare Other | Admitting: Internal Medicine

## 2022-04-07 DIAGNOSIS — D509 Iron deficiency anemia, unspecified: Secondary | ICD-10-CM | POA: Diagnosis not present

## 2022-04-24 ENCOUNTER — Encounter: Payer: Self-pay | Admitting: Gastroenterology

## 2022-04-28 DIAGNOSIS — E538 Deficiency of other specified B group vitamins: Secondary | ICD-10-CM | POA: Diagnosis not present

## 2022-06-08 DIAGNOSIS — M0579 Rheumatoid arthritis with rheumatoid factor of multiple sites without organ or systems involvement: Secondary | ICD-10-CM | POA: Diagnosis not present

## 2022-06-08 DIAGNOSIS — Z79899 Other long term (current) drug therapy: Secondary | ICD-10-CM | POA: Diagnosis not present

## 2022-06-12 ENCOUNTER — Ambulatory Visit (AMBULATORY_SURGERY_CENTER): Payer: Self-pay

## 2022-06-12 ENCOUNTER — Other Ambulatory Visit: Payer: Self-pay

## 2022-06-12 ENCOUNTER — Encounter: Payer: Self-pay | Admitting: Internal Medicine

## 2022-06-12 ENCOUNTER — Ambulatory Visit: Payer: 59 | Attending: Internal Medicine | Admitting: Internal Medicine

## 2022-06-12 VITALS — Ht 61.0 in | Wt 170.0 lb

## 2022-06-12 VITALS — BP 132/86 | HR 71 | Temp 98.4°F | Ht 61.0 in | Wt 186.0 lb

## 2022-06-12 DIAGNOSIS — Z6835 Body mass index (BMI) 35.0-35.9, adult: Secondary | ICD-10-CM

## 2022-06-12 DIAGNOSIS — Z1211 Encounter for screening for malignant neoplasm of colon: Secondary | ICD-10-CM

## 2022-06-12 DIAGNOSIS — I10 Essential (primary) hypertension: Secondary | ICD-10-CM

## 2022-06-12 DIAGNOSIS — R0789 Other chest pain: Secondary | ICD-10-CM

## 2022-06-12 DIAGNOSIS — Z79899 Other long term (current) drug therapy: Secondary | ICD-10-CM

## 2022-06-12 DIAGNOSIS — M0579 Rheumatoid arthritis with rheumatoid factor of multiple sites without organ or systems involvement: Secondary | ICD-10-CM | POA: Diagnosis not present

## 2022-06-12 DIAGNOSIS — E538 Deficiency of other specified B group vitamins: Secondary | ICD-10-CM | POA: Diagnosis not present

## 2022-06-12 DIAGNOSIS — D509 Iron deficiency anemia, unspecified: Secondary | ICD-10-CM | POA: Diagnosis not present

## 2022-06-12 DIAGNOSIS — F320 Major depressive disorder, single episode, mild: Secondary | ICD-10-CM

## 2022-06-12 DIAGNOSIS — Z13820 Encounter for screening for osteoporosis: Secondary | ICD-10-CM | POA: Diagnosis not present

## 2022-06-12 MED ORDER — LOSARTAN POTASSIUM 25 MG PO TABS: 25.0000 mg | ORAL_TABLET | Freq: Every day | ORAL | 1 refills | Status: DC

## 2022-06-12 MED ORDER — NA SULFATE-K SULFATE-MG SULF 17.5-3.13-1.6 GM/177ML PO SOLN: 1.0000 | Freq: Once | ORAL | 0 refills | Status: AC

## 2022-06-12 NOTE — Progress Notes (Signed)
Patient ID: Michele Lang, female    DOB: 20-Sep-1975  MRN: [id number]  CC: Hypertension (HTN & rheumatoid arthritis f/u. Michele Lang received flu vax this season. )   Subjective: Michele Lang is a 47 y.o. female who presents for Her concerns today include:  Patient with history of HTN, RA with positive RA factor, anxiety, anemia  HYPERTENSION Currently taking: see medication list Med Adherence: '[x]'$  Yes.  Takes Norvasc 10 mg in evenings Medication side effects: '[]'$  Yes    '[x]'$  No Adherence with salt restriction: '[x]'$  Yes    '[]'$  No Home Monitoring?: '[]'$  Yes    '[]'$  No Monitoring Frequency:  Home BP results range:  SOB? '[]'$  Yes    '[x]'$  No Chest Pain?: '[x]'$  Yes LT sided CP 2 episodes b/w yesterday and today.  lasted a few seconds  Occurs only if she moves arm a certain way. No radiation Leg swelling?: '[]'$  Yes    '[x]'$  No Headaches?: '[]'$  Yes    '[x]'$  No Dizziness? '[]'$  Yes    '[x]'$  No Comments:   Anemia: stopped oral iron supplement.  Referred to Onc by her Rheumatologist.  Rec iron infusion.  Last one was mid December.  Menses last 5-7 days with moderate bleeding.  Schedule for c-scope later this mth with [facility] Lang GI Also found to be B12 def.  Receiving B12 inj through Oncologist  RA:  On Xeljanz 11 mg daily and Prednisone 10 mg daily.  Last Bone density 2020  Obesity:  eating habits poor right now. Eats 1.5 meal a day.  Drinks water juice and sodas  Pos Dep Screen:  Reports chronic depression that was worse during the pandemic.  Has a lot going on at home. In the process of moving and having to change kids school.  Was doing counseling on line last year but fell by the wayside.     HM: Schedule for c-scope later this mth with Michele Lang.  Last PAP done HD 2022?? Patient Active Problem List   Diagnosis Date Noted   Rheumatoid arthritis involving multiple sites with positive rheumatoid factor (Michele Lang) 01/24/2022   Iron deficiency anemia 01/24/2022   GAD (generalized anxiety disorder)  01/24/2022   Axillary lymphadenopathy 09/06/2020   Chest pain, atypical 11/22/2013   Essential hypertension 01/04/2013   Cough 01/04/2013   Pleural effusion 01/03/2013     Current Outpatient Medications on File Prior to Visit  Medication Sig Dispense Refill   amLODipine (NORVASC) 10 MG tablet Take 1 tablet (10 mg total) by mouth daily. 30 tablet 6   busPIRone (BUSPAR) 5 MG tablet TAKE ONE TABLET BY MOUTH EVERY DAY TWICE DAILY (FOR ANXIETY) 60 tablet 2   predniSONE (DELTASONE) 5 MG tablet Take 5 mg by mouth 2 (two) times daily.     Tofacitinib Citrate ER (XELJANZ XR) 11 MG TB24 Take by mouth.     No current facility-administered medications on file prior to visit.    Allergies  Allergen Reactions   Infliximab Anaphylaxis   Penicillins Rash    Has patient had a PCN reaction causing immediate rash, facial/tongue/throat swelling, SOB or lightheadedness with hypotension: Yes Has patient had a PCN reaction causing severe rash involving mucus membranes or skin necrosis: Yes Has patient had a PCN reaction that required hospitalization No Has patient had a PCN reaction occurring within the last 10 years: No If all of the above answers are "NO", then may proceed with Cephalosporin use.     Social History   Socioeconomic History   Marital  status: Single    Spouse name: Not on file   Number of children: Not on file   Years of education: Not on file   Highest education level: Not on file  Occupational History   Not on file  Tobacco Use   Smoking status: Never   Smokeless tobacco: Never  Vaping Use   Vaping Use: Never used  Substance and Sexual Activity   Alcohol use: No   Drug use: No   Sexual activity: Never  Other Topics Concern   Not on file  Social History Narrative   Not on file   Social Determinants of Health   Financial Resource Strain: Not on file  Food Insecurity: Not on file  Transportation Needs: Not on file  Physical Activity: Not on file  Stress: Not on  file  Social Connections: Not on file  Intimate Partner Violence: Not on file    Family History  Problem Relation Age of Onset   Hypertension Mother    Heart disease Mother    Hypertension Father    Hypertension Sister    Depression Maternal Uncle    Depression Maternal Grandmother    Rheum arthritis Maternal Grandmother    Asthma Son    Allergies Son    Colon cancer Neg Hx    Esophageal cancer Neg Hx    Rectal cancer Neg Hx    Stomach cancer Neg Hx     Past Surgical History:  Procedure Laterality Date   BREAST SURGERY     FOOT SURGERY      ROS: Review of Systems Negative except as stated above  PHYSICAL EXAM: BP 132/86 (BP Location: Left Arm, Patient Position: Sitting, Cuff Size: Large)   Pulse 71   Temp 98.4 F (36.9 C) (Oral)   Ht '5\' 1"'$  (1.549 m)   Wt 186 lb (84.4 kg)   SpO2 98%   BMI 35.14 kg/m   Repeat BP 155/90 Physical Exam  General appearance - alert, well appearing, and in no distress Mental status - normal mood, behavior, speech, dress, motor activity, and thought processes Neck - supple, no significant adenopathy Chest - clear to auscultation, no wheezes, rales or rhonchi, symmetric air entry Heart - normal rate, regular rhythm, normal S1, S2, no murmurs, rubs, clicks or gallops Extremities - peripheral pulses normal, no pedal edema, no clubbing or cyanosis MSK: She ambulates with a cane.  Has valgum deformity of the knees     06/12/2022    4:32 PM 01/24/2022    1:58 PM  Depression screen PHQ 2/9  Decreased Interest 1 1  Down, Depressed, Hopeless 1 1  PHQ - 2 Score 2 2  Altered sleeping 1 1  Tired, decreased energy 0 1  Change in appetite 1 2  Feeling bad or failure about yourself  1 0  Trouble concentrating 1 0  Moving slowly or fidgety/restless 0 0  Suicidal thoughts 0 0  PHQ-9 Score 6 6       Latest Ref Rng & Units 08/15/2020    1:09 PM 08/01/2018    5:20 PM 07/25/2018    7:21 PM  CMP  Glucose 70 - 99 mg/dL 85  105  102   BUN 6 - 20  mg/dL '8  15  11   '$ Creatinine 0.44 - 1.00 mg/dL 0.59  0.74  0.86   Sodium 135 - 145 mmol/L 133  134  137   Potassium 3.5 - 5.1 mmol/L 3.7  3.8  3.6   Chloride 98 -  111 mmol/L 102  102  102   CO2 22 - 32 mmol/L '24  25  30   '$ Calcium 8.9 - 10.3 mg/dL 8.5  8.9  8.8   Total Protein 6.5 - 8.1 g/dL  7.8  7.5   Total Bilirubin 0.3 - 1.2 mg/dL  0.5  0.2   Alkaline Phos 38 - 126 U/L  58  55   AST 15 - 41 U/L  17  30   ALT 0 - 44 U/L  13  19    Lipid Panel  No results found for: "CHOL", "TRIG", "HDL", "CHOLHDL", "VLDL", "LDLCALC", "LDLDIRECT"  CBC    Component Value Date/Time   WBC 7.3 01/24/2022 1504   WBC 5.9 08/15/2020 1309   RBC 4.64 01/24/2022 1504   RBC 4.02 08/15/2020 1309   HGB 9.9 (L) 01/24/2022 1504   HCT 33.7 (L) 01/24/2022 1504   PLT 332 01/24/2022 1504   MCV 73 (L) 01/24/2022 1504   MCH 21.3 (L) 01/24/2022 1504   MCH 20.4 (L) 08/15/2020 1309   MCHC 29.4 (L) 01/24/2022 1504   MCHC 29.2 (L) 08/15/2020 1309   RDW 19.1 (H) 01/24/2022 1504   LYMPHSABS 1.7 07/25/2018 1921   MONOABS 0.0 (L) 07/25/2018 1921   EOSABS 0.2 07/25/2018 1921   BASOSABS 0.1 07/25/2018 1921    ASSESSMENT AND PLAN:  1. Essential hypertension Not at goal.  I note that her blood pressure reading was also elevated the last time she saw her rheumatologist.  Continue Norvasc 10 mg daily.  Add Cozaar 25 mg daily. - losartan (COZAAR) 25 MG tablet; Take 1 tablet (25 mg total) by mouth daily.  Dispense: 90 tablet; Refill: 1  2. Iron deficiency anemia, unspecified iron deficiency anemia type Followed by oncology.  Receives iron infusion.  3. B12 deficiency Followed by oncology at [facility].  Getting B12 injections  4. Chest pain, atypical Atypical.  Advised that if she has increased episodes of pain with activity, she should call back and let me know.  5. Class 2 severe obesity with serious comorbidity and body mass index (BMI) of 35.0 to 35.9 in adult, unspecified obesity type [facility]) Patient  advised to eliminate sugary drinks from the diet, cut back on portion sizes especially of white carbohydrates, eat more white lean meat like chicken [place] and seafood instead of beef or pork and incorporate fresh fruits and vegetables into the diet daily.   6. Rheumatoid arthritis involving multiple sites with positive rheumatoid factor ([place]) Will get bone density study given that she has been on prednisone for a while. - DG Bone Density; Future  7. Major depressive disorder, single episode, mild ([place]) Plan was to give her a handout of behavioral health resources in the [place] area so that she can call and get established with a counselor but patient left before we gave her discharge summary  8. High risk medication use - DG Bone Density; Future  9. Osteoporosis screening - DG Bone Density; Future   Patient was to sign a release for [place] to get last Pap smear report from the health department.  Patient was given the opportunity to ask questions.  Patient verbalized understanding of the plan and was able to repeat key elements of the plan.   This documentation was completed using Radio producer.  Any transcriptional errors are unintentional.  Orders Placed This Encounter  Procedures   DG Bone Density     Requested Prescriptions   Signed Prescriptions Disp Refills  losartan (COZAAR) 25 MG tablet 90 tablet 1    Sig: Take 1 tablet (25 mg total) by mouth daily.    Return in about 4 months (around 10/11/2022) for Please sign release and get last PAP report from Health Department.  Karle Plumber, MD, FACP

## 2022-06-12 NOTE — Patient Instructions (Addendum)
Your blood pressure is not at goal.  We have added Losartan 25 mg daily. We have referred you for a bone density study.   Healthy Eating Following a healthy eating pattern may help you to achieve and maintain a healthy body weight, reduce the risk of chronic disease, and live a long and productive life. It is important to follow a healthy eating pattern at an appropriate calorie level for your body. Your nutritional needs should be met primarily through food by choosing a variety of nutrient-rich foods. What are tips for following this plan? Reading food labels Read labels and choose the following: Reduced or low sodium. Juices with 100% fruit juice. Foods with low saturated fats and high polyunsaturated and monounsaturated fats. Foods with whole grains, such as whole wheat, cracked wheat, brown rice, and wild rice. Whole grains that are fortified with folic acid. This is recommended for women who are pregnant or who want to become pregnant. Read labels and avoid the following: Foods with a lot of added sugars. These include foods that contain brown sugar, corn sweetener, corn syrup, dextrose, fructose, glucose, high-fructose corn syrup, honey, invert sugar, lactose, malt syrup, maltose, molasses, raw sugar, sucrose, trehalose, or turbinado sugar. Do not eat more than the following amounts of added sugar per day: 6 teaspoons (25 g) for women. 9 teaspoons (38 g) for men. Foods that contain processed or refined starches and grains. Refined grain products, such as white flour, degermed cornmeal, white bread, and white rice. Shopping Choose nutrient-rich snacks, such as vegetables, whole fruits, and nuts. Avoid high-calorie and high-sugar snacks, such as potato chips, fruit snacks, and candy. Use oil-based dressings and spreads on foods instead of solid fats such as butter, stick margarine, or cream cheese. Limit pre-made sauces, mixes, and "instant" products such as flavored rice, instant  noodles, and ready-made pasta. Try more plant-protein sources, such as tofu, tempeh, black beans, edamame, lentils, nuts, and seeds. Explore eating plans such as the Mediterranean diet or vegetarian diet. Cooking Use oil to saut or stir-fry foods instead of solid fats such as butter, stick margarine, or lard. Try baking, boiling, grilling, or broiling instead of frying. Remove the fatty part of meats before cooking. Steam vegetables in water or broth. Meal planning  At meals, imagine dividing your plate into fourths: One-half of your plate is fruits and vegetables. One-fourth of your plate is whole grains. One-fourth of your plate is protein, especially lean meats, poultry, eggs, tofu, beans, or nuts. Include low-fat dairy as part of your daily diet. Lifestyle Choose healthy options in all settings, including home, work, school, restaurants, or stores. Prepare your food safely: Wash your hands after handling raw meats. Keep food preparation surfaces clean by regularly washing with hot, soapy water. Keep raw meats separate from ready-to-eat foods, such as fruits and vegetables. Cook seafood, meat, poultry, and eggs to the recommended internal temperature. Store foods at safe temperatures. In general: Keep cold foods at 38F (4.4C) or below. Keep hot foods at 138F (60C) or above. Keep your freezer at [facility] (-17.8C) or below. Foods are no longer safe to eat when they have been between the temperatures of 40-138F (4.4-60C) for more than 2 hours. What foods should I eat? Fruits Aim to eat 2 cup-equivalents of fresh, canned (in natural juice), or frozen fruits each day. Examples of 1 cup-equivalent of fruit include 1 small apple, 8 large strawberries, 1 cup canned fruit,  cup dried fruit, or 1 cup 100% juice. Vegetables Aim to eat 2-3  cup-equivalents of fresh and frozen vegetables each day, including different varieties and colors. Examples of 1 cup-equivalent of vegetables include 2  medium carrots, 2 cups raw, leafy greens, 1 cup chopped vegetable (raw or cooked), or 1 medium baked potato. Grains Aim to eat 6 ounce-equivalents of whole grains each day. Examples of 1 ounce-equivalent of grains include 1 slice of bread, 1 cup ready-to-eat cereal, 3 cups popcorn, or  cup cooked rice, pasta, or cereal. Meats and other proteins Aim to eat 5-6 ounce-equivalents of protein each day. Examples of 1 ounce-equivalent of protein include 1 egg, 1/2 cup nuts or seeds, or 1 tablespoon (16 g) peanut butter. A cut of meat or fish that is the size of a deck of cards is about 3-4 ounce-equivalents. Of the protein you eat each week, try to have at least 8 ounces come from seafood. This includes salmon, trout, herring, and anchovies. Dairy Aim to eat 3 cup-equivalents of fat-free or low-fat dairy each day. Examples of 1 cup-equivalent of dairy include 1 cup (240 mL) milk, 8 ounces (250 g) yogurt, 1 ounces (44 g) natural cheese, or 1 cup (240 mL) fortified soy milk. Fats and oils Aim for about 5 teaspoons (21 g) per day. Choose monounsaturated fats, such as canola and olive oils, avocados, peanut butter, and most nuts, or polyunsaturated fats, such as sunflower, corn, and soybean oils, walnuts, pine nuts, sesame seeds, sunflower seeds, and flaxseed. Beverages Aim for six 8-oz glasses of water per day. Limit coffee to three to five 8-oz cups per day. Limit caffeinated beverages that have added calories, such as soda and energy drinks. Limit alcohol intake to no more than 1 drink a day for nonpregnant women and 2 drinks a day for men. One drink equals 12 oz of beer (355 mL), 5 oz of wine (148 mL), or 1 oz of hard liquor (44 mL). Seasoning and other foods Avoid adding excess amounts of salt to your foods. Try flavoring foods with herbs and spices instead of salt. Avoid adding sugar to foods. Try using oil-based dressings, sauces, and spreads instead of solid fats. This information is based on  general [place] nutrition guidelines. For more information, visit [employer]. Exact amounts may vary based on your nutrition needs. Summary A healthy eating plan may help you to maintain a healthy weight, reduce the risk of chronic diseases, and stay active throughout your life. Plan your meals. Make sure you eat the right portions of a variety of nutrient-rich foods. Try baking, boiling, grilling, or broiling instead of frying. Choose healthy options in all settings, including home, work, school, restaurants, or stores. This information is not intended to replace advice given to you by your health care provider. Make sure you discuss any questions you have with your health care provider. Document Revised: 10/19/2021 Document Reviewed: 01/04/2021 Elsevier Patient Education  [place].

## 2022-06-12 NOTE — Progress Notes (Signed)
Denies allergies to eggs or soy products. Denies complication of anesthesia or sedation. Denies use of weight loss medication. Denies use of O2.   Emmi instructions given for colonoscopy. 

## 2022-06-16 ENCOUNTER — Telehealth: Payer: Self-pay | Admitting: Gastroenterology

## 2022-06-16 ENCOUNTER — Encounter: Payer: Self-pay | Admitting: Internal Medicine

## 2022-06-16 NOTE — Telephone Encounter (Signed)
Spoke with pt- told her to push fluids between now and procedure and not to eat any further foods listed on the instructions.   All questions answered at this time.

## 2022-06-16 NOTE — Telephone Encounter (Signed)
Inbound call from patient, states she has a procedure on Tuesday. Patient has yet to receive her prep medication sent to pharmacy or her instructions. Advised patient instructions were in MyChart. And Prep to be sent to pharmacy. Patient states she was not aware she was not supposed to eat beans or nuts. Would like nurse to let her know if she can proceed with procedure.

## 2022-06-19 ENCOUNTER — Telehealth: Payer: Self-pay | Admitting: *Deleted

## 2022-06-19 DIAGNOSIS — Z1211 Encounter for screening for malignant neoplasm of colon: Secondary | ICD-10-CM

## 2022-06-19 MED ORDER — NA SULFATE-K SULFATE-MG SULF 17.5-3.13-1.6 GM/177ML PO SOLN: 1.0000 | Freq: Once | ORAL | 0 refills | Status: AC

## 2022-06-19 MED ORDER — NA SULFATE-K SULFATE-MG SULF 17.5-3.13-1.6 GM/177ML PO SOLN: 1.0000 | Freq: Once | ORAL | 0 refills | Status: DC

## 2022-06-19 NOTE — Telephone Encounter (Signed)
Patient has not received prep from home delivery pharmacy.  Attempted to contact the pharmacy, not able to reach a live person or leave a message.  Patient to attempt to reach pharmacy and will call back if she needs SUPREP sent to a local pharmacy close to her home.

## 2022-06-19 NOTE — Telephone Encounter (Signed)
Patient not able to reach [facility] either. Rx for SUPREP sent to [facility] [address] cancelled prev rx at [facility].

## 2022-06-20 ENCOUNTER — Telehealth: Payer: Self-pay

## 2022-06-20 ENCOUNTER — Ambulatory Visit (AMBULATORY_SURGERY_CENTER): Payer: 59 | Admitting: Gastroenterology

## 2022-06-20 ENCOUNTER — Encounter: Payer: Self-pay | Admitting: Gastroenterology

## 2022-06-20 VITALS — BP 141/80 | HR 60 | Temp 98.6°F | Resp 11 | Ht 61.0 in | Wt 170.0 lb

## 2022-06-20 DIAGNOSIS — Z1211 Encounter for screening for malignant neoplasm of colon: Secondary | ICD-10-CM

## 2022-06-20 MED ORDER — SODIUM CHLORIDE 0.9 % IV SOLN: 500.0000 mL | Freq: Once | INTRAVENOUS | Status: DC

## 2022-06-20 NOTE — Progress Notes (Signed)
Report to PACU, RN, vss, BBS= Clear.  

## 2022-06-20 NOTE — Progress Notes (Signed)
Cell phone off per pt   Pt states she started Losartan last Thursday, developed chest pain.  She called PCP who felt Losartan was the cause of it and discontinued this medication.  No further order from PCP.  CRNA and MD made aware

## 2022-06-20 NOTE — Progress Notes (Signed)
GASTROENTEROLOGY PROCEDURE H&P NOTE   Primary Care Physician: Ladell Pier, MD    Reason for Procedure:  Colon Cancer screening  Plan:    Colonoscopy  Patient is appropriate for endoscopic procedure(s) in the ambulatory ([place]) setting.  The nature of the procedure, as well as the risks, benefits, and alternatives were carefully and thoroughly reviewed with the patient. Ample time for discussion and questions allowed. The patient understood, was satisfied, and agreed to proceed.     HPI: Michele Lang is a 47 y.o. female who presents for colonoscopy for routine Colon Cancer screening.  No active GI symptoms.  No known family history of colon cancer or related malignancy.  Patient is otherwise without complaints or active issues today.  Past Medical History:  Diagnosis Date   Anemia    Anxiety    Arthritis    Depression    GERD (gastroesophageal reflux disease)    Heart murmur    Hypertension    Sleep apnea     Past Surgical History:  Procedure Laterality Date   BREAST SURGERY     FOOT SURGERY      Prior to Admission medications   Medication Sig Start Date End Date Taking? Authorizing Provider  acetaminophen (TYLENOL) 650 MG CR tablet Take 650 mg by mouth as needed for pain.   Yes [provider]  amLODipine (NORVASC) 10 MG tablet Take 1 tablet (10 mg total) by mouth daily. 01/24/22  Yes Ladell Pier, MD  busPIRone (BUSPAR) 5 MG tablet TAKE ONE TABLET BY MOUTH EVERY DAY TWICE DAILY (FOR ANXIETY) 03/29/22  Yes Ladell Pier, MD  losartan (COZAAR) 25 MG tablet Take 1 tablet (25 mg total) by mouth daily. 06/12/22  Yes Ladell Pier, MD  predniSONE (DELTASONE) 5 MG tablet Take 5 mg by mouth 2 (two) times daily. 12/29/21  Yes [provider]  Tofacitinib Citrate ER 11 MG TB24 Take 11 mg by mouth daily.   Yes [provider]  EPINEPHrine 0.3 mg/0.3 mL IJ SOAJ injection Inject 0.3 mLs into the muscle as needed. Patient not  taking: Reported on 06/20/2022 09/29/21   [provider]  ibuprofen (ADVIL) 200 MG tablet Take 200 mg by mouth as needed.    [provider]  OVER THE COUNTER MEDICATION B 12 injection, one injection per month.    [provider]  Tofacitinib Citrate ER (XELJANZ XR) 11 MG TB24 Take by mouth. 11/08/21 02/06/22  [provider]    Current Outpatient Medications  Medication Sig Dispense Refill   acetaminophen (TYLENOL) 650 MG CR tablet Take 650 mg by mouth as needed for pain.     amLODipine (NORVASC) 10 MG tablet Take 1 tablet (10 mg total) by mouth daily. 30 tablet 6   busPIRone (BUSPAR) 5 MG tablet TAKE ONE TABLET BY MOUTH EVERY DAY TWICE DAILY (FOR ANXIETY) 60 tablet 2   losartan (COZAAR) 25 MG tablet Take 1 tablet (25 mg total) by mouth daily. 90 tablet 1   predniSONE (DELTASONE) 5 MG tablet Take 5 mg by mouth 2 (two) times daily.     Tofacitinib Citrate ER 11 MG TB24 Take 11 mg by mouth daily.     EPINEPHrine 0.3 mg/0.3 mL IJ SOAJ injection Inject 0.3 mLs into the muscle as needed. (Patient not taking: Reported on 06/20/2022)     ibuprofen (ADVIL) 200 MG tablet Take 200 mg by mouth as needed.     OVER THE COUNTER MEDICATION B 12 injection, one  injection per month.     Tofacitinib Citrate ER (XELJANZ XR) 11 MG TB24 Take by mouth.     Current Facility-Administered Medications  Medication Dose Route Frequency Provider Last Rate Last Admin   0.9 %  sodium chloride infusion  500 mL Intravenous Once Tashima Scarpulla V, DO        Allergies as of 06/20/2022 - Review Complete 06/20/2022  Allergen Reaction Noted   Infliximab Anaphylaxis 09/29/2021   Penicillins Rash 05/11/2011    Family History  Problem Relation Age of Onset   Hypertension Mother    Heart disease Mother    Hypertension Father    Hypertension Sister    Depression Maternal Uncle    Depression Maternal Grandmother    Rheum arthritis Maternal Grandmother    Asthma Son    Allergies Son     Colon cancer Neg Hx    Esophageal cancer Neg Hx    Rectal cancer Neg Hx    Stomach cancer Neg Hx     Social History   Socioeconomic History   Marital status: Single    Spouse name: Not on file   Number of children: Not on file   Years of education: Not on file   Highest education level: Not on file  Occupational History   Not on file  Tobacco Use   Smoking status: Never   Smokeless tobacco: Never  Vaping Use   Vaping Use: Never used  Substance and Sexual Activity   Alcohol use: No   Drug use: No   Sexual activity: Never  Other Topics Concern   Not on file  Social History Narrative   Not on file   Social Determinants of Health   Financial Resource Strain: Not on file  Food Insecurity: Not on file  Transportation Needs: Not on file  Physical Activity: Not on file  Stress: Not on file  Social Connections: Not on file  Intimate Partner Violence: Not on file    Physical Exam: Vital signs in last 24 hours: '@BP'$  120/74   Pulse 78   Temp 98.6 F (37 C)   Ht '5\' 1"'$  (1.549 m)   Wt 170 lb (77.1 kg)   LMP 05/18/2022   SpO2 96%   BMI 32.12 kg/m  GEN: NAD EYE: Sclerae anicteric ENT: MMM CV: Non-tachycardic Pulm: CTA b/l GI: Soft, NT/ND NEURO:  Alert & Oriented x [place], DO [facility]   06/20/2022 12:14 PM

## 2022-06-20 NOTE — Telephone Encounter (Signed)
Returned patient call.  Told patient nothing else by mouth after 8:30 and if she is still having dark and cloudy BM's as her time approaches to arrive we may need to consider rescheduling.  Patient verbalized understanding.

## 2022-06-20 NOTE — Patient Instructions (Signed)
YOU HAD AN ENDOSCOPIC PROCEDURE TODAY AT THE [facility]:   Refer to the procedure report that was given to you for any specific questions about what was found during the examination.  If the procedure report does not answer your questions, please call your gastroenterologist to clarify.  If you requested that your care partner not be given the details of your procedure findings, then the procedure report has been included in a sealed envelope for you to review at your convenience later.  YOU SHOULD EXPECT: Some feelings of bloating in the abdomen. Passage of more gas than usual.  Walking can help get rid of the air that was put into your GI tract during the procedure and reduce the bloating. If you had a lower endoscopy (such as a colonoscopy or flexible sigmoidoscopy) you may notice spotting of blood in your stool or on the toilet paper. If you underwent a bowel prep for your procedure, you may not have a normal bowel movement for a few days.  Please Note:  You might notice some irritation and congestion in your nose or some drainage.  This is from the oxygen used during your procedure.  There is no need for concern and it should clear up in a day or so.  SYMPTOMS TO REPORT IMMEDIATELY:  Following lower endoscopy (colonoscopy or flexible sigmoidoscopy):  Excessive amounts of blood in the stool  Significant tenderness or worsening of abdominal pains  Swelling of the abdomen that is new, acute  Fever of 100F or higher  For urgent or emergent issues, a gastroenterologist can be reached at any hour by calling [phone]. Do not use MyChart messaging for urgent concerns.    DIET:  We do recommend a small meal at first, but then you may proceed to your regular diet.  Drink plenty of fluids but you should avoid alcoholic beverages for 24 hours.  ACTIVITY:  You should plan to take it easy for the rest of today and you should NOT DRIVE or use heavy machinery until tomorrow (because of  the sedation medicines used during the test).    FOLLOW UP: Our staff will call the number listed on your records the next business day following your procedure.  We will call around 7:15- 8:00 am to check on you and address any questions or concerns that you may have regarding the information given to you following your procedure. If we do not reach you, we will leave a message.     If any biopsies were taken you will be contacted by phone or by letter within the next 1-3 weeks.  Please call us at [phone] if you have not heard about the biopsies in 3 weeks.    SIGNATURES/CONFIDENTIALITY: You and/or your care partner have signed paperwork which will be entered into your electronic medical record.  These signatures attest to the fact that that the information above on your After Visit Summary has been reviewed and is understood.  Full responsibility of the confidentiality of this discharge information lies with you and/or your care-partner.  

## 2022-06-20 NOTE — Op Note (Signed)
[facility] Patient Name: Michele Lang Procedure Date: 06/20/2022 12:06 PM MRN: [id number] Endoscopist: Gerrit Heck , MD, [id number] Age: 47 Referring MD:  Date of Birth: 1975-12-12 Gender: Female Account #: [id number] Procedure:                Colonoscopy Indications:              Screening for colorectal malignant neoplasm, This                            is the patient's first colonoscopy Medicines:                Monitored Anesthesia Care Procedure:                Pre-Anesthesia Assessment:                           - Prior to the procedure, a History and Physical                            was performed, and patient medications and                            allergies were reviewed. The patient's tolerance of                            previous anesthesia was also reviewed. The risks                            and benefits of the procedure and the sedation                            options and risks were discussed with the patient.                            All questions were answered, and informed consent                            was obtained. Prior Anticoagulants: The patient has                            taken no anticoagulant or antiplatelet agents. ASA                            Grade Assessment: II - A patient with mild systemic                            disease. After reviewing the risks and benefits,                            the patient was deemed in satisfactory condition to                            undergo the procedure.  After obtaining informed consent, the colonoscope                            was passed under direct vision. Throughout the                            procedure, the patient's blood pressure, pulse, and                            oxygen saturations were monitored continuously. The                            Olympus CF-HQ190L ([id number]) Colonoscope was                            introduced through  the anus and advanced to the the                            cecum, identified by appendiceal orifice and                            ileocecal valve. The colonoscopy was performed                            without difficulty. The patient tolerated the                            procedure well. The quality of the bowel                            preparation was good. The ileocecal valve,                            appendiceal orifice, and rectum were photographed. Scope In: 12:20:14 PM Scope Out: 12:31:09 PM Scope Withdrawal Time: 0 hours 7 minutes 19 seconds  Total Procedure Duration: 0 hours 10 minutes 55 seconds  Findings:                 The perianal and digital rectal examinations were                            normal.                           The entire colon appeared normal.                           The retroflexed view of the distal rectum and anal                            verge was normal and showed no anal or rectal                            abnormalities. Complications:            No immediate complications.  Estimated Blood Loss:     Estimated blood loss: none. Impression:               - The entire examined colon is normal.                           - The distal rectum and anal verge are normal on                            retroflexion view.                           - No specimens collected. Recommendation:           - Patient has a contact number available for                            emergencies. The signs and symptoms of potential                            delayed complications were discussed with the                            patient. Return to normal activities tomorrow.                            Written discharge instructions were provided to the                            patient.                           - Resume previous diet.                           - Continue present medications.                           - Repeat colonoscopy in 10 years for  screening                            purposes.                           - Return to GI office PRN. Gerrit Heck, MD 06/20/2022 12:37:05 PM

## 2022-06-21 ENCOUNTER — Telehealth: Payer: Self-pay | Admitting: *Deleted

## 2022-06-21 NOTE — Telephone Encounter (Signed)
  Follow up Call-     06/20/2022   11:17 AM  Call back number  Post procedure Call Back phone  # [phone]  Permission to leave phone message Yes   [facility]

## 2022-06-28 DIAGNOSIS — D509 Iron deficiency anemia, unspecified: Secondary | ICD-10-CM | POA: Diagnosis not present

## 2022-06-28 DIAGNOSIS — E611 Iron deficiency: Secondary | ICD-10-CM | POA: Diagnosis not present

## 2022-06-28 DIAGNOSIS — E538 Deficiency of other specified B group vitamins: Secondary | ICD-10-CM | POA: Diagnosis not present

## 2022-06-30 ENCOUNTER — Ambulatory Visit: Payer: 59 | Attending: Internal Medicine

## 2022-06-30 DIAGNOSIS — Z0000 Encounter for general adult medical examination without abnormal findings: Secondary | ICD-10-CM

## 2022-06-30 NOTE — Progress Notes (Signed)
Subjective:   Michele Lang is a 47 y.o. female who presents for Medicare Annual (Subsequent) preventive examination.  Review of Systems    connected Michele Lang with 06/30/22 on  at  1:57 pm by telephone and verified that I am speaking with the correct person using two identifiers. I discussed the limitations, risks, security and privacy concerns of performing an evaluation and management service by telephone and the availability of in person appointments. I also discussed with the patient that there may be a patient responsible charge related to this service. The patient expressed understanding and agreed to proceed.  Patient location:  home  My Location: Community health and wellness  Persons on the telephone call:   Myself [facility] Warner Mccreedy) and Michele Lang       Objective:    Today's Vitals   06/30/22 1358  PainSc: 7    There is no height or weight on file to calculate BMI.     06/30/2022    2:04 PM 07/25/2018    6:11 PM 02/09/2016   10:33 PM 02/09/2016    6:00 PM 12/27/2015   11:07 AM 09/24/2014   11:04 AM  Advanced Directives  Does Patient Have a Medical Advance Directive? No No No No No No  Would patient like information on creating a medical advance directive? Yes (ED - Information included in AVS) No - Patient declined  No - patient declined information No - patient declined information     Current Medications (verified) Outpatient Encounter Medications as of 06/30/2022  Medication Sig   acetaminophen (TYLENOL) 650 MG CR tablet Take 650 mg by mouth as needed for pain.   amLODipine (NORVASC) 10 MG tablet Take 1 tablet (10 mg total) by mouth daily.   EPINEPHrine 0.3 mg/0.3 mL IJ SOAJ injection Inject 0.3 mLs into the muscle as needed.   OVER THE COUNTER MEDICATION B 12 injection, one injection per month.   predniSONE (DELTASONE) 5 MG tablet Take 5 mg by mouth 2 (two) times daily.   Tofacitinib Citrate ER 11 MG TB24 Take 11 mg by mouth daily.   busPIRone (BUSPAR) 5 MG  tablet TAKE ONE TABLET BY MOUTH EVERY DAY TWICE DAILY (FOR ANXIETY) (Patient not taking: Reported on 06/30/2022)   ibuprofen (ADVIL) 200 MG tablet Take 200 mg by mouth as needed. (Patient not taking: Reported on 06/30/2022)   losartan (COZAAR) 25 MG tablet Take 1 tablet (25 mg total) by mouth daily. (Patient not taking: Reported on 06/30/2022)   Tofacitinib Citrate ER (XELJANZ XR) 11 MG TB24 Take by mouth.   No facility-administered encounter medications on file as of 06/30/2022.    Allergies (verified) Infliximab and Penicillins   History: Past Medical History:  Diagnosis Date   Anemia    Anxiety    Arthritis    Depression    GERD (gastroesophageal reflux disease)    Heart murmur    Hypertension    Sleep apnea    Past Surgical History:  Procedure Laterality Date   BREAST SURGERY     FOOT SURGERY     Family History  Problem Relation Age of Onset   Hypertension Mother    Heart disease Mother    Hypertension Father    Hypertension Sister    Depression Maternal Uncle    Depression Maternal Grandmother    Rheum arthritis Maternal Grandmother    Asthma Son    Allergies Son    Colon cancer Neg Hx    Esophageal cancer Neg Hx  Rectal cancer Neg Hx    Stomach cancer Neg Hx    Social History   Socioeconomic History   Marital status: Single    Spouse name: Not on file   Number of children: Not on file   Years of education: Not on file   Highest education level: Not on file  Occupational History   Not on file  Tobacco Use   Smoking status: Never   Smokeless tobacco: Never  Vaping Use   Vaping Use: Never used  Substance and Sexual Activity   Alcohol use: No   Drug use: No   Sexual activity: Never  Other Topics Concern   Not on file  Social History Narrative   Not on file   Social Determinants of Health   Financial Resource Strain: Low Risk  (06/30/2022)   Overall Financial Resource Strain (CARDIA)    Difficulty of Paying Living Expenses: Not hard at all  Food  Insecurity: Food Insecurity Present (06/30/2022)   Hunger Vital Sign    Worried About Running Out of Food in the Last Year: Sometimes true    Ran Out of Food in the Last Year: Sometimes true  Transportation Needs: Unmet Transportation Needs (06/30/2022)   PRAPARE - Transportation    Lack of Transportation (Medical): Yes    Lack of Transportation (Non-Medical): Yes  Physical Activity: Insufficiently Active (06/30/2022)   Exercise Vital Sign    Days of Exercise per Week: 2 days    Minutes of Exercise per Session: 10 min  Stress: No Stress Concern Present (06/30/2022)   [place]    Feeling of Stress : Only a little  Social Connections: Socially Isolated (06/30/2022)   Social Connection and Isolation Panel [NHANES]    Frequency of Communication with Friends and Family: Once a week    Frequency of Social Gatherings with Friends and Family: Once a week    Attends Religious Services: Never    Marine scientist or Organizations: No    Attends Music therapist: Never    Marital Status: Never married    Tobacco Counseling Counseling given: Not Answered   Clinical Intake:     Pain : 0-10 Pain Score: 7  Pain Location: Leg (and knee)     Diabetes: No     Diabetic?no          Activities of Daily Living    06/30/2022    2:05 PM  In your present state of health, do you have any difficulty performing the following activities:  Hearing? 1  Vision? 1  Difficulty concentrating or making decisions? 1  Comment sometimes concentrating  Walking or climbing stairs? 0  Dressing or bathing? 1  Doing errands, shopping? 0  Preparing Food and eating ? Y  Using the Toilet? Y  Comment sometimes  In the past six months, have you accidently leaked urine? Y  Do you have problems with loss of bowel control? N  Managing your Medications? N  Managing your Finances? N  Housekeeping or managing your Housekeeping? N     Patient Care Team: Ladell Pier, MD as PCP - General (Internal Medicine)  Indicate any recent Medical Services you may have received from other than [facility] providers in the past year (date may be approximate).     Assessment:   This is a routine wellness examination for Michele Lang.  Hearing/Vision screen No results found.  Dietary issues and exercise activities discussed:     Goals  Addressed   None   Depression Screen    06/12/2022    4:32 PM 01/24/2022    1:58 PM  PHQ 2/9 Scores  PHQ - 2 Score 2 2  PHQ- 9 Score 6 6    Fall Risk    06/30/2022    2:04 PM 06/12/2022    3:10 PM 01/24/2022    1:58 PM  [place] in the past year? 0 0 0  Number falls in past yr: 0 0 0  Injury with Fall? 0 0 0  Risk for fall due to : No Fall Risks No Fall Risks No Fall Risks    FALL RISK PREVENTION PERTAINING TO THE HOME:  Any stairs in or around the home? Yes  If so, are there any without handrails? Yes  Home free of loose throw rugs in walkways, pet beds, electrical cords, etc? No  Adequate lighting in your home to reduce risk of falls? Yes   ASSISTIVE DEVICES UTILIZED TO PREVENT FALLS:  Life alert? No  Use of a cane, walker or w/c? Yes  Grab bars in the bathroom? No  Shower chair or bench in shower? No  Elevated toilet seat or a handicapped toilet? Yes   TIMED UP AND GO:  Was the test performed? Yes .  Length of time to ambulate 10 feet:  sec.   Gait slow and steady with assistive device  Cognitive Function:    06/30/2022    2:07 PM  MMSE - Mini Mental State Exam  Orientation to time 5  Orientation to Place 5  Registration 3  Attention/ Calculation 5  Recall 3  Language- name 2 objects 2  Language- repeat 1  Language- follow 3 step command 3  Language- read & follow direction 1  Write a sentence 1  Copy design 1  Total score 30        06/30/2022    2:09 PM  6CIT Screen  What Year? 0 points  What month? 0 points  What time? 0 points  Count back  from 20 0 points  Months in reverse 0 points  Repeat phrase 0 points  Total Score 0 points    Immunizations Immunization History  Administered Date(s) Administered   Influenza Whole 02/20/2012   Influenza,inj,Quad PF,6+ Mos 01/24/2022   PFIZER(Purple Top)SARS-COV-2 Vaccination 03/13/2020   Pneumococcal Conjugate-13 05/12/2015   Pneumococcal Polysaccharide-23 12/22/2015    TDAP status: Due, Education has been provided regarding the importance of this vaccine. Advised may receive this vaccine at local pharmacy or Health Dept. Aware to provide a copy of the vaccination record if obtained from local pharmacy or Health Dept. Verbalized acceptance and understanding.  Flu Vaccine status: Up to date  Pneumococcal vaccine status: Up to date  Covid-19 vaccine status: Information provided on how to obtain vaccines.   Qualifies for Shingles Vaccine? No   Zostavax completed No   Shingrix Completed?: No.    Education has been provided regarding the importance of this vaccine. Patient has been advised to call insurance company to determine out of pocket expense if they have not yet received this vaccine. Advised may also receive vaccine at local pharmacy or Health Dept. Verbalized acceptance and understanding.  Screening Tests Health Maintenance  Topic Date Due   DTaP/Tdap/Td (1 - Tdap) Never done   PAP SMEAR-Modifier  Never done   COVID-19 Vaccine (2 - Pfizer risk series) 04/03/2020   Medicare Annual Wellness (AWV)  07/01/2023   COLONOSCOPY (Pts 45-48yr Insurance coverage  will need to be confirmed)  06/20/2032   INFLUENZA VACCINE  Completed   Hepatitis C Screening  Completed   HIV Screening  Completed   HPV VACCINES  Aged Out    Health Maintenance  Health Maintenance Due  Topic Date Due   DTaP/Tdap/Td (1 - Tdap) Never done   PAP SMEAR-Modifier  Never done   COVID-19 Vaccine (2 - Pfizer risk series) 04/03/2020    Colorectal cancer screening: Type of screening: Colonoscopy.  Completed 06/20/22. Repeat every 10 years  Mammogram: Patient sated to having one in 2022  Bone Density Screening : Not of age yet   Lung Cancer Screening: (Low Dose CT Chest recommended if Age 62-80 years, 30 pack-year currently smoking OR have quit w/in 15years.) does not qualify.   Lung Cancer Screening Referral: n/a  Additional Screening:  Hepatitis C Screening: does qualify; Completed 01/24/22  Vision Screening: Recommended annual ophthalmology exams for early detection of glaucoma and other disorders of the eye. Is the patient up to date with their annual eye exam?   n/a Who is the provider or what is the name of the office in which the patient attends annual eye exams? N/a If pt is not established with a provider, would they like to be referred to a provider to establish care? No .   Dental Screening: Recommended annual dental exams for proper oral hygiene  Community Resource Referral / Chronic Care Management: CRR required this visit?  No   CCM required this visit?  No      Plan:     I have personally reviewed and noted the following in the patient's chart:   Medical and social history Use of alcohol, tobacco or illicit drugs  Current medications and supplements including opioid prescriptions. Patient is not currently taking opioid prescriptions. Functional ability and status Nutritional status Physical activity Advanced directives List of other physicians Hospitalizations, surgeries, and ER visits in previous 12 months Vitals Screenings to include cognitive, depression, and falls Referrals and appointments  In addition, I have reviewed and discussed with patient certain preventive protocols, quality metrics, and best practice recommendations. A written personalized care plan for preventive services as well as general preventive health recommendations were provided to patient.     Lillie Columbia, [place]   06/30/2022   Nurse Notes:

## 2022-07-13 ENCOUNTER — Other Ambulatory Visit: Payer: Self-pay | Admitting: Internal Medicine

## 2022-07-13 DIAGNOSIS — F411 Generalized anxiety disorder: Secondary | ICD-10-CM

## 2022-07-13 NOTE — Telephone Encounter (Signed)
Requested Prescriptions  Pending Prescriptions Disp Refills   busPIRone (BUSPAR) 5 MG tablet [Pharmacy Med Name: buspirone 5 mg tablet] 180 tablet 1    Sig: TAKE ONE TABLET BY MOUTH ONCE OR TWICE DAILY (FOR ANXIETY)     Psychiatry: Anxiolytics/Hypnotics - Non-controlled Passed - 07/13/2022 11:21 AM      Passed - Valid encounter within last 12 months    Recent Outpatient Visits           1 month ago Essential hypertension   [place], MD   5 months ago Establishing care with new doctor, encounter for   [place], MD       Future Appointments             In 3 months Wynetta Emery Dalbert Batman, MD [place]

## 2022-07-20 DIAGNOSIS — D509 Iron deficiency anemia, unspecified: Secondary | ICD-10-CM | POA: Diagnosis not present

## 2022-07-28 DIAGNOSIS — E538 Deficiency of other specified B group vitamins: Secondary | ICD-10-CM | POA: Diagnosis not present

## 2022-08-10 ENCOUNTER — Other Ambulatory Visit: Payer: Self-pay | Admitting: Internal Medicine

## 2022-08-10 DIAGNOSIS — I10 Essential (primary) hypertension: Secondary | ICD-10-CM

## 2022-08-10 NOTE — Telephone Encounter (Signed)
Requested Prescriptions  Pending Prescriptions Disp Refills   amLODipine (NORVASC) 10 MG tablet [Pharmacy Med Name: amlodipine 10 mg tablet] 30 tablet 2    Sig: TAKE ONE TABLET BY MOUTH EVERY DAY     Cardiovascular: Calcium Channel Blockers 2 Failed - 08/10/2022  1:04 PM      Failed - Last BP in normal range    BP Readings from Last 1 Encounters:  06/20/22 (!) 141/80         Passed - Last Heart Rate in normal range    Pulse Readings from Last 1 Encounters:  06/20/22 60         Passed - Valid encounter within last 6 months    Recent Outpatient Visits           1 month ago Essential hypertension   [place], MD   6 months ago Establishing care with new doctor, encounter for   [place], MD       Future Appointments             In 2 months Ladell Pier, MD [place]

## 2022-08-19 ENCOUNTER — Ambulatory Visit
Admission: RE | Admit: 2022-08-19 | Discharge: 2022-08-19 | Disposition: A | Discharge status: Home / Self Care | Payer: 59 | Source: Ambulatory Visit | Attending: Physician Assistant | Admitting: Physician Assistant

## 2022-08-19 ENCOUNTER — Ambulatory Visit (INDEPENDENT_AMBULATORY_CARE_PROVIDER_SITE_OTHER): Payer: 59

## 2022-08-19 VITALS — BP 147/93 | HR 69 | Temp 97.7°F | Resp 17

## 2022-08-19 DIAGNOSIS — M25572 Pain in left ankle and joints of left foot: Secondary | ICD-10-CM

## 2022-08-19 DIAGNOSIS — W19XXXA Unspecified fall, initial encounter: Secondary | ICD-10-CM

## 2022-08-19 DIAGNOSIS — M79672 Pain in left foot: Secondary | ICD-10-CM

## 2022-08-19 DIAGNOSIS — M2012 Hallux valgus (acquired), left foot: Secondary | ICD-10-CM | POA: Diagnosis not present

## 2022-08-19 DIAGNOSIS — R0789 Other chest pain: Secondary | ICD-10-CM | POA: Diagnosis not present

## 2022-08-19 DIAGNOSIS — R079 Chest pain, unspecified: Secondary | ICD-10-CM

## 2022-08-19 DIAGNOSIS — R600 Localized edema: Secondary | ICD-10-CM | POA: Diagnosis not present

## 2022-08-19 MED ORDER — IBUPROFEN 600 MG PO TABS: 600.0000 mg | ORAL_TABLET | Freq: Three times a day (TID) | ORAL | 0 refills | Status: AC | PRN

## 2022-08-19 NOTE — ED Provider Notes (Signed)
[facility]    CSN: [id number] Arrival date & time: 08/19/22  1145      History   Chief Complaint Chief Complaint  Patient presents with   Fall    HPI Michele Lang is a 47 y.o. female.   Patient presents today with a 1 day history of chest pain and left ankle and foot pain following a fall.  Reports that something fell off her car while she was shopping and she tripped over this while trying to move her cart causing her to fall forward onto the anterior portion of her body.  She reports that pain is rated 9 was a 10 pain scale, described as sharp, worse with movement, localized to her left foot and ankle, described as aching.  She does have a history of rheumatoid arthritis and often has pain but this is different than normal.  She has tried Tylenol without improvement.  She reports that during her fall she did not hit her head and denies any loss of consciousness, nausea, vomiting, headache, dizziness.  She does not take any blood thinning medications.  She is having difficulty ambulating as a result of symptoms due to the severe pain in her foot.  Denies any numbness or paresthesias.    Past Medical History:  Diagnosis Date   Anemia    Anxiety    Arthritis    Depression    GERD (gastroesophageal reflux disease)    Heart murmur    Hypertension    Sleep apnea     Patient Active Problem List   Diagnosis Date Noted   Rheumatoid arthritis involving multiple sites with positive rheumatoid factor ([place]) 01/24/2022   Iron deficiency anemia 01/24/2022   GAD (generalized anxiety disorder) 01/24/2022   Axillary lymphadenopathy 09/06/2020   Chest pain, atypical 11/22/2013   Essential hypertension 01/04/2013   Cough 01/04/2013   Pleural effusion 01/03/2013    Past Surgical History:  Procedure Laterality Date   BREAST SURGERY     FOOT SURGERY      OB History     Gravida  4   Para  3   Term  3   Preterm  0   AB  1   Living  3      SAB  1    IAB  0   Ectopic  0   Multiple  0   Live Births               Home Medications    Prior to Admission medications   Medication Sig Start Date End Date Taking? Authorizing Provider  ibuprofen (ADVIL) 600 MG tablet Take 1 tablet (600 mg total) by mouth every 8 (eight) hours as needed. 08/19/22  Yes Jacara Benito, Derry Skill, PA-C  acetaminophen (TYLENOL) 650 MG CR tablet Take 650 mg by mouth as needed for pain.    [provider]  amLODipine (NORVASC) 10 MG tablet TAKE ONE TABLET BY MOUTH EVERY DAY 08/10/22   Ladell Pier, MD  busPIRone (BUSPAR) 5 MG tablet TAKE ONE TABLET BY MOUTH ONCE OR TWICE DAILY (FOR ANXIETY) 07/13/22   Ladell Pier, MD  EPINEPHrine 0.3 mg/0.3 mL IJ SOAJ injection Inject 0.3 mLs into the muscle as needed. 09/29/21   [provider]  losartan (COZAAR) 25 MG tablet Take 1 tablet (25 mg total) by mouth daily. Patient not taking: Reported on 06/30/2022 06/12/22   Ladell Pier, MD  OVER THE COUNTER MEDICATION B 12 injection, one injection per month.  [provider]  predniSONE (DELTASONE) 5 MG tablet Take 5 mg by mouth 2 (two) times daily. 12/29/21   [provider]  Tofacitinib Citrate ER (XELJANZ XR) 11 MG TB24 Take by mouth. 11/08/21 02/06/22  [provider]  Tofacitinib Citrate ER 11 MG TB24 Take 11 mg by mouth daily.    [provider]    Family History Family History  Problem Relation Age of Onset   Hypertension Mother    Heart disease Mother    Hypertension Father    Hypertension Sister    Depression Maternal Uncle    Depression Maternal Grandmother    Rheum arthritis Maternal Grandmother    Asthma Son    Allergies Son    Colon cancer Neg Hx    Esophageal cancer Neg Hx    Rectal cancer Neg Hx    Stomach cancer Neg Hx     Social History Social History   Tobacco Use   Smoking status: Never   Smokeless tobacco: Never  Vaping Use   Vaping Use: Never used  Substance Use Topics    Alcohol use: No   Drug use: No     Allergies   Infliximab and Penicillins   Review of Systems Review of Systems  Constitutional:  Positive for activity change. Negative for appetite change, fatigue and fever.  Respiratory:  Negative for cough and shortness of breath.   Cardiovascular:  Positive for chest pain (chest wall). Negative for palpitations and leg swelling.  Gastrointestinal:  Negative for abdominal pain, diarrhea, nausea and vomiting.  Musculoskeletal:  Positive for arthralgias, gait problem and joint swelling. Negative for myalgias.  Neurological:  Negative for dizziness, syncope, weakness, light-headedness, numbness and headaches.     Physical Exam Triage Vital Signs ED Triage Vitals  Enc Vitals Group     BP 08/19/22 1232 (!) 147/93     Pulse Rate 08/19/22 1232 69     Resp 08/19/22 1232 17     Temp 08/19/22 1232 97.7 F (36.5 C)     Temp Source 08/19/22 1232 Oral     SpO2 08/19/22 1232 98 %     Weight --      Height --      Head Circumference --      Peak Flow --      Pain Score 08/19/22 1233 8     Pain Loc --      Pain Edu? --      Excl. in [place]? --    No data found.  Updated Vital Signs BP (!) 147/93 (BP Location: Left Arm)   Pulse 69   Temp 97.7 F (36.5 C) (Oral)   Resp 17   LMP  (LMP Unknown)   SpO2 98%   Visual Acuity Right Eye Distance:   Left Eye Distance:   Bilateral Distance:    Right Eye Near:   Left Eye Near:    Bilateral Near:     Physical Exam Vitals reviewed.  Constitutional:      General: She is awake. She is not in acute distress.    Appearance: Normal appearance. She is well-developed. She is not ill-appearing.     Comments: Very pleasant female appears stated age sitting comfortably in wheelchair in no acute distress  HENT:     Head: Normocephalic and atraumatic.  Cardiovascular:     Rate and Rhythm: Normal rate and regular rhythm.     Heart sounds: Normal heart sounds, S1 normal and S2 normal. No murmur  heard.  Pulmonary:     Effort: Pulmonary effort is normal.     Breath sounds: Normal breath sounds. No wheezing, rhonchi or rales.     Comments: Clear to auscultation bilaterally Abdominal:     General: Bowel sounds are normal.     Palpations: Abdomen is soft.     Tenderness: There is no abdominal tenderness. There is no right CVA tenderness, left CVA tenderness, guarding or rebound.  Psychiatric:        Behavior: Behavior is cooperative.      UC Treatments / Results  Labs (all labs ordered are listed, but only abnormal results are displayed) Labs Reviewed - No data to display  EKG   Radiology DG Ankle Complete Left  Result Date: 08/19/2022 CLINICAL DATA:  Pain after fall yesterday. Left foot and ankle pain after fall in store. EXAM: LEFT ANKLE COMPLETE - 3+ VIEW COMPARISON:  None Available. FINDINGS: There is no evidence of fracture, dislocation, or joint effusion. The ankle mortise is preserved. Intact talar dome. No erosions or periostitis. There is a plantar calcaneal spur. Suggestion of mild anterior soft tissue edema. IMPRESSION: Mild anterior soft tissue edema. No fracture or subluxation of the left ankle. Electronically Signed   By: Keith Rake M.D.   On: 08/19/2022 13:22   DG Foot Complete Left  Result Date: 08/19/2022 CLINICAL DATA:  Pain after fall yesterday. Left foot and ankle pain after fall in store. EXAM: LEFT FOOT - COMPLETE 3+ VIEW COMPARISON:  None Available. FINDINGS: There is no evidence of fracture or dislocation. Hallux valgus. Suggestion of pes planus on these nonweightbearing views. There are hammertoe deformity of the third through fifth toes. Degenerative change of the first metatarsal phalangeal joint. Minor degenerative spurring in the dorsal midfoot. There is a plantar calcaneal spur. No erosions or periostitis. Soft tissues are unremarkable. IMPRESSION: 1. No fracture or subluxation of the left foot. 2. 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 Electronically Signed   By: Keith Rake M.D.   On: 08/19/2022 13:22   DG Chest 2 View  Result Date: 08/19/2022 CLINICAL DATA:  Pain after fall yesterday. Generalized chest pain after fall in store yesterday. EXAM: CHEST - 2 VIEW COMPARISON:  Chest radiograph 09/06/2020 FINDINGS: The cardiomediastinal contours are normal. The heart is upper normal in size. Aortic atherosclerosis. The lungs are clear. Chronic right pleural thickening. Pulmonary vasculature is normal. No consolidation or pneumothorax. No acute osseous abnormalities are seen. IMPRESSION: No acute chest findings or evidence of traumatic injury. Electronically Signed   By: Keith Rake M.D.   On: 08/19/2022 13:19    Procedures Procedures (including critical care time)  Medications Ordered in UC Medications - No data to display  Initial Impression / Assessment and Plan / UC Course  I have reviewed the triage vital signs and the nursing notes.  Pertinent labs & imaging results that were available during my care of the patient were reviewed by me and considered in my medical decision making (see chart for details).     Patient is well-appearing, afebrile, nontoxic, nontachycardic.  No indication for head or neck CT based on [place] CT rules.  X-rays of chest wall, ankle, foot obtained which showed no acute osseous abnormality.  Discussed that chest pain is likely related to musculoskeletal injury given pain is reproducible on exam.  She was encouraged to use heat over her chest wall but use RICE protocol for her ankle and foot.  She was given a postop shoe for  comfort and support.  Will start ibuprofen for pain relief and discussed that she is not to take additional NSAIDs with this medication due to risk of GI bleeding.  Can use acetaminophen/Tylenol for breakthrough pain.  Discussed that if she has any worsening or changing symptoms including increasing pain, shortness of breath, nausea/vomiting  interfering with oral intake, difficulty walking, numbness or tingling in her foot she should be seen immediately.  Strict return precautions given.  Patient declined work excuse note.  Final Clinical Impressions(s) / UC Diagnoses   Final diagnoses:  Chest wall pain  Left foot pain  Acute left ankle pain  Fall, initial encounter     Discharge Instructions      All of your x-rays were reassuring with no evidence of a broken bone.  I suspect that you have bruising/contusion contributing to your symptoms.  Use a heating pad on your chest and then keep your ankle/foot elevated with ice.  Take ibuprofen for pain.  Do not take NSAIDs with this medication including aspirin, ibuprofen/Advil, naproxen/Aleve.  If your symptoms or not improving quickly please return for reevaluation.  If anything worsens and you have increasing pain, shortness of breath, weakness, numbness or tingling in your foot you need to be seen immediately.     ED Prescriptions     Medication Sig Dispense Auth. Provider   ibuprofen (ADVIL) 600 MG tablet Take 1 tablet (600 mg total) by mouth every 8 (eight) hours as needed. 21 tablet Shamicka Inga, Derry Skill, PA-C      PDMP not reviewed this encounter.   Terrilee Croak, PA-C 08/19/22 1335

## 2022-08-19 NOTE — ED Triage Notes (Signed)
Pt presents with generalized chest pain and left foot & ankle injury after falling face down in store yesterday.

## 2022-08-19 NOTE — Discharge Instructions (Signed)
All of your x-rays were reassuring with no evidence of a broken bone.  I suspect that you have bruising/contusion contributing to your symptoms.  Use a heating pad on your chest and then keep your ankle/foot elevated with ice.  Take ibuprofen for pain.  Do not take NSAIDs with this medication including aspirin, ibuprofen/Advil, naproxen/Aleve.  If your symptoms or not improving quickly please return for reevaluation.  If anything worsens and you have increasing pain, shortness of breath, weakness, numbness or tingling in your foot you need to be seen immediately.

## 2022-08-23 DIAGNOSIS — E538 Deficiency of other specified B group vitamins: Secondary | ICD-10-CM | POA: Diagnosis not present

## 2022-09-07 ENCOUNTER — Other Ambulatory Visit: Payer: Self-pay | Admitting: Internal Medicine

## 2022-09-07 DIAGNOSIS — I10 Essential (primary) hypertension: Secondary | ICD-10-CM

## 2022-09-29 DIAGNOSIS — M0579 Rheumatoid arthritis with rheumatoid factor of multiple sites without organ or systems involvement: Secondary | ICD-10-CM | POA: Diagnosis not present

## 2022-09-29 DIAGNOSIS — Z79899 Other long term (current) drug therapy: Secondary | ICD-10-CM | POA: Diagnosis not present

## 2022-10-11 ENCOUNTER — Telehealth: Payer: Self-pay

## 2022-10-11 NOTE — Progress Notes (Signed)
Patient attempted to be outreached by Frederic Jericho, PharmD Candidate on 10/11/22 to discuss hypertension. Left voicemail for patient to return our call at their convenience at [phone].  Frederic Jericho, Student-PharmD

## 2022-10-11 NOTE — Progress Notes (Signed)
   Michele Lang [birth year] [id number]  Patient outreached by Frederic Jericho , PharmD Candidate on 10/11/2022.  Blood Pressure Readings: Last documented ambulatory systolic blood pressure: 147 Last documented ambulatory diastolic blood pressure: 93 Does the patient have a validated home blood pressure machine?: Yes  Patient reports not being able to find her BP cuff; it may be beneficial to assist her in getting a new one through her insurance or assistance program.  Medication review was performed. Is the patient taking their medications as prescribed?: Yes  Differences from their prescribed list include: Patient confirmed that she doesn't take losartan 25 mg tablets anymore because the first dose made her have chest pains.  The following barriers to adherence were noted: Does the patient have cost concerns?: Yes Does the patient have transportation concerns?: Yes Does the patient need assistance obtaining refills?: No Does the patient occassionally forget to take some of their prescribed medications?: Yes Does the patient feel like one/some of their medications make them feel poorly?: No Does the patient have questions or concerns about their medications?: No Does the patient have a follow up scheduled with their primary care provider/cardiologist?: Yes   Interventions: Interventions Completed: Medications were reviewed, Patient was educated on goal blood pressures and long term health implications of elevated blood pressure, Patient was educated on proper technique to check home blood pressure and reminded to bring home machine and readings to next provider appointment, Patient was educated on use of adherence strategies, like a pill box or alarms, Patient was educated on medications, including indication and administration, Patient was educated on how to access home blood pressure machine  The patient has follow up scheduled:  PCP: Marcine Matar, MD   Frederic Jericho,  Student-PharmD

## 2022-10-13 ENCOUNTER — Ambulatory Visit: Payer: 59 | Attending: Internal Medicine | Admitting: Internal Medicine

## 2022-10-13 ENCOUNTER — Encounter: Payer: Self-pay | Admitting: Internal Medicine

## 2022-10-13 VITALS — BP 130/82 | HR 97 | Temp 98.2°F | Ht 61.0 in | Wt 196.0 lb

## 2022-10-13 DIAGNOSIS — I10 Essential (primary) hypertension: Secondary | ICD-10-CM | POA: Diagnosis not present

## 2022-10-13 DIAGNOSIS — M0579 Rheumatoid arthritis with rheumatoid factor of multiple sites without organ or systems involvement: Secondary | ICD-10-CM

## 2022-10-13 DIAGNOSIS — E538 Deficiency of other specified B group vitamins: Secondary | ICD-10-CM | POA: Diagnosis not present

## 2022-10-13 DIAGNOSIS — Z6837 Body mass index (BMI) 37.0-37.9, adult: Secondary | ICD-10-CM

## 2022-10-13 NOTE — Progress Notes (Signed)
Patient ID: Michele Lang, female    DOB: 02-19-1976  MRN: [id number]  CC: Hypertension (HTN f/u. Franchot Erichsen Losartan - pt stopped taking due to breathing issues/Yes to pap for another appt. )   Subjective: Michele Lang is a 47 y.o. female who presents for chronic ds  Her concerns today include:  Patient with history of HTN, RA with positive RA factor, anxiety, anemia   HYPERTENSION Currently taking: see medication list.  Cozaar 25 mg add to Norvasc 10 mg daily on last visit.  The Cozaar caused CP and breathing problems so she stopped taking it Med Adherence: [x]  Yes -Norvasc 10 mg daily Medication side effects: []  Yes    []  No Adherence with salt restriction: [x]  Yes    []  No Home Monitoring?: []  Yes    [x]  No but does have device Monitoring Frequency:  Home BP results range:  SOB? []  Yes    [x]  No Chest Pain?: []  Yes    [x]  No Leg swelling?: []  Yes    [x]  No Headaches?: []  Yes    []  No Dizziness? []  Yes    []  No Comments:   RA:  still on Prednisone 5 mg BID and Xeljanz XR. Followed by [facility] radiology Bone density ordered on last visit.  Never called  for bone density  B12 def: Received monthly injections through oncology at [facility].  Last shot today Had iron infusion last mth.  I am able to review her labs that she had done recently through [facility].  H/H stable with most recent value being 11.3/36.4  Obesity: Reports she is doing good with her eating habits.  Needs to eat more fruits and veggies Up 10 lbs since I last saw her. Patient Active Problem List   Diagnosis Date Noted   Rheumatoid arthritis involving multiple sites with positive rheumatoid factor (HCC) 01/24/2022   Iron deficiency anemia 01/24/2022   GAD (generalized anxiety disorder) 01/24/2022   Axillary lymphadenopathy 09/06/2020   Chest pain, atypical 11/22/2013   Essential hypertension 01/04/2013   Cough 01/04/2013   Pleural effusion 01/03/2013     Current Outpatient  Medications on File Prior to Visit  Medication Sig Dispense Refill   amLODipine (NORVASC) 10 MG tablet TAKE ONE TABLET BY MOUTH EVERY DAY 30 tablet 2   busPIRone (BUSPAR) 5 MG tablet TAKE ONE TABLET BY MOUTH ONCE OR TWICE DAILY (FOR ANXIETY) 180 tablet 1   EPINEPHrine 0.3 mg/0.3 mL IJ SOAJ injection Inject 0.3 mLs into the muscle as needed.     OVER THE COUNTER MEDICATION B 12 injection, one injection per month.     predniSONE (DELTASONE) 5 MG tablet Take 5 mg by mouth 2 (two) times daily.     Tofacitinib Citrate ER 11 MG TB24 Take 11 mg by mouth daily.     acetaminophen (TYLENOL) 650 MG CR tablet Take 650 mg by mouth as needed for pain. (Patient not taking: Reported on 10/13/2022)     ibuprofen (ADVIL) 600 MG tablet Take 1 tablet (600 mg total) by mouth every 8 (eight) hours as needed. (Patient not taking: Reported on 10/11/2022) 21 tablet 0   Tofacitinib Citrate ER (XELJANZ XR) 11 MG TB24 Take by mouth.     No current facility-administered medications on file prior to visit.    Allergies  Allergen Reactions   Infliximab Anaphylaxis   Cozaar [Losartan Potassium] Other (See Comments)    Caused CP   Penicillins Rash    Has patient had  a PCN reaction causing immediate rash, facial/tongue/throat swelling, SOB or lightheadedness with hypotension: Yes Has patient had a PCN reaction causing severe rash involving mucus membranes or skin necrosis: Yes Has patient had a PCN reaction that required hospitalization No Has patient had a PCN reaction occurring within the last 10 years: No If all of the above answers are "NO", then may proceed with Cephalosporin use.     Social History   Socioeconomic History   Marital status: Single    Spouse name: Not on file   Number of children: Not on file   Years of education: Not on file   Highest education level: Not on file  Occupational History   Not on file  Tobacco Use   Smoking status: Never   Smokeless tobacco: Never  Vaping Use   Vaping Use:  Never used  Substance and Sexual Activity   Alcohol use: No   Drug use: No   Sexual activity: Never  Other Topics Concern   Not on file  Social History Narrative   Not on file   Social Determinants of Health   Financial Resource Strain: Low Risk  (06/30/2022)   Overall Financial Resource Strain (CARDIA)    Difficulty of Paying Living Expenses: Not hard at all  Food Insecurity: Food Insecurity Present (06/30/2022)   Hunger Vital Sign    Worried About Running Out of Food in the Last Year: Sometimes true    Ran Out of Food in the Last Year: Sometimes true  Transportation Needs: Unmet Transportation Needs (06/30/2022)   PRAPARE - Transportation    Lack of Transportation (Medical): Yes    Lack of Transportation (Non-Medical): Yes  Physical Activity: Insufficiently Active (06/30/2022)   Exercise Vital Sign    Days of Exercise per Week: 2 days    Minutes of Exercise per Session: 10 min  Stress: No Stress Concern Present (06/30/2022)   [employer] of Occupational Health - Occupational Stress Questionnaire    Feeling of Stress : Only a little  Social Connections: Socially Isolated (06/30/2022)   Social Connection and Isolation Panel [NHANES]    Frequency of Communication with Friends and Family: Once a week    Frequency of Social Gatherings with Friends and Family: Once a week    Attends Religious Services: Never    Database administrator or Organizations: No    Attends Engineer, structural: Never    Marital Status: Never married  Catering manager Violence: Not on file    Family History  Problem Relation Age of Onset   Hypertension Mother    Heart disease Mother    Hypertension Father    Hypertension Sister    Depression Maternal Uncle    Depression Maternal Grandmother    Rheum arthritis Maternal Grandmother    Asthma Son    Allergies Son    Colon cancer Neg Hx    Esophageal cancer Neg Hx    Rectal cancer Neg Hx    Stomach cancer Neg Hx     Past Surgical History:   Procedure Laterality Date   BREAST SURGERY     FOOT SURGERY      ROS: Review of Systems Negative except as stated above  PHYSICAL EXAM: BP 130/82 (BP Location: Left Arm, Patient Position: Sitting, Cuff Size: Large)   Pulse 97   Temp 98.2 F (36.8 C) (Oral)   Ht 5\' 1"  (1.549 m)   Wt 196 lb (88.9 kg)   SpO2 (!) 74%   BMI 37.03 kg/m  Wt Readings from Last 3 Encounters:  10/13/22 196 lb (88.9 kg)  06/20/22 170 lb (77.1 kg)  06/12/22 186 lb (84.4 kg)    Physical Exam  General appearance - alert, well appearing, middle-age obese African-American female and in no distress Mental status - normal mood, behavior, speech, dress, motor activity, and thought processes Chest - clear to auscultation, no wheezes, rales or rhonchi, symmetric air entry Heart - normal rate, regular rhythm, normal S1, S2, no murmurs, rubs, clicks or gallops Musculoskeletal -patient with significant deformities of the joints in the hands.  She ambulates with a cane. Extremities -no lower extremity edema.      Latest Ref Rng & Units 08/15/2020    1:09 PM 08/01/2018    5:20 PM 07/25/2018    7:21 PM  CMP  Glucose 70 - 99 mg/dL 85  454  098   BUN 6 - 20 mg/dL 8  15  11    Creatinine 0.44 - 1.00 mg/dL 1.19  1.47  8.29   Sodium 135 - 145 mmol/L 133  134  137   Potassium 3.5 - 5.1 mmol/L 3.7  3.8  3.6   Chloride 98 - 111 mmol/L 102  102  102   CO2 22 - 32 mmol/L 24  25  30    Calcium 8.9 - 10.3 mg/dL 8.5  8.9  8.8   Total Protein 6.5 - 8.1 g/dL  7.8  7.5   Total Bilirubin 0.3 - 1.2 mg/dL  0.5  0.2   Alkaline Phos 38 - 126 U/L  58  55   AST 15 - 41 U/L  17  30   ALT 0 - 44 U/L  13  19    Lipid Panel  No results found for: "CHOL", "TRIG", "HDL", "CHOLHDL", "VLDL", "LDLCALC", "LDLDIRECT"  CBC    Component Value Date/Time   WBC 7.3 01/24/2022 1504   WBC 5.9 08/15/2020 1309   RBC 4.64 01/24/2022 1504   RBC 4.02 08/15/2020 1309   HGB 9.9 (L) 01/24/2022 1504   HCT 33.7 (L) 01/24/2022 1504   PLT 332  01/24/2022 1504   MCV 73 (L) 01/24/2022 1504   MCH 21.3 (L) 01/24/2022 1504   MCH 20.4 (L) 08/15/2020 1309   MCHC 29.4 (L) 01/24/2022 1504   MCHC 29.2 (L) 08/15/2020 1309   RDW 19.1 (H) 01/24/2022 1504   LYMPHSABS 1.7 07/25/2018 1921   MONOABS 0.0 (L) 07/25/2018 1921   EOSABS 0.2 07/25/2018 1921   BASOSABS 0.1 07/25/2018 1921    ASSESSMENT AND PLAN: 1. Essential hypertension Close to goal.  She will continue Norvasc 10 mg daily.  Cozaar has been removed from the list.  2. Class 2 severe obesity due to excess calories with serious comorbidity and body mass index (BMI) of 37.0 to 37.9 in adult [facility]) Patient advised to eliminate sugary drinks from the diet, cut back on portion sizes especially of white carbohydrates, eat more white lean meat like chicken [place] and seafood instead of beef or pork and incorporate fresh fruits and vegetables into the diet daily.   3. B12 deficiency Receiving B12 injections through her oncologist.  4. Rheumatoid arthritis involving multiple sites with positive rheumatoid factor (HCC) Plugged in with rheumatology at [facility].     Patient was given the opportunity to ask questions.  Patient verbalized understanding of the plan and was able to repeat key elements of the plan.   This documentation was completed using Paediatric nurse.  Any transcriptional errors are unintentional.  No orders of the defined types were placed in this encounter.    Requested Prescriptions    No prescriptions requested or ordered in this encounter    Return in about 7 weeks (around 12/01/2022) for PAP.  Jonah Blue, MD, FACP

## 2022-11-27 DIAGNOSIS — E538 Deficiency of other specified B group vitamins: Secondary | ICD-10-CM | POA: Diagnosis not present

## 2022-11-29 ENCOUNTER — Other Ambulatory Visit: Payer: Self-pay | Admitting: Internal Medicine

## 2022-11-29 DIAGNOSIS — I10 Essential (primary) hypertension: Secondary | ICD-10-CM

## 2022-12-11 ENCOUNTER — Ambulatory Visit: Payer: 59 | Admitting: Internal Medicine

## 2022-12-25 DIAGNOSIS — E538 Deficiency of other specified B group vitamins: Secondary | ICD-10-CM | POA: Diagnosis not present

## 2022-12-25 DIAGNOSIS — D509 Iron deficiency anemia, unspecified: Secondary | ICD-10-CM | POA: Diagnosis not present

## 2023-01-03 DIAGNOSIS — M0579 Rheumatoid arthritis with rheumatoid factor of multiple sites without organ or systems involvement: Secondary | ICD-10-CM | POA: Diagnosis not present

## 2023-01-03 DIAGNOSIS — Z79899 Other long term (current) drug therapy: Secondary | ICD-10-CM | POA: Diagnosis not present

## 2023-02-06 ENCOUNTER — Ambulatory Visit: Payer: Self-pay

## 2023-02-06 ENCOUNTER — Emergency Department (HOSPITAL_COMMUNITY)
Admission: EM | Admit: 2023-02-06 | Discharge: 2023-02-06 | Disposition: A | Discharge status: Home / Self Care | Payer: 59 | Attending: Emergency Medicine | Admitting: Emergency Medicine

## 2023-02-06 ENCOUNTER — Encounter (HOSPITAL_COMMUNITY): Payer: Self-pay | Admitting: Emergency Medicine

## 2023-02-06 ENCOUNTER — Emergency Department (HOSPITAL_COMMUNITY): Payer: 59

## 2023-02-06 DIAGNOSIS — Z5321 Procedure and treatment not carried out due to patient leaving prior to being seen by health care provider: Secondary | ICD-10-CM | POA: Diagnosis not present

## 2023-02-06 DIAGNOSIS — R079 Chest pain, unspecified: Secondary | ICD-10-CM | POA: Diagnosis not present

## 2023-02-06 DIAGNOSIS — R0602 Shortness of breath: Secondary | ICD-10-CM | POA: Insufficient documentation

## 2023-02-06 LAB — TROPONIN I (HIGH SENSITIVITY)
Troponin I (High Sensitivity): 5 ng/L (ref <18)
Troponin I (High Sensitivity): 5 ng/L (ref <18)

## 2023-02-06 LAB — CBC
HCT: 37.8 % (ref 36.0–46.0)
Hemoglobin: 11.3 g/dL — ABNORMAL LOW (ref 12.0–15.0)
MCH: 23.3 pg — ABNORMAL LOW (ref 26.0–34.0)
MCHC: 29.9 g/dL — ABNORMAL LOW (ref 30.0–36.0)
MCV: 77.9 fL — ABNORMAL LOW (ref 80.0–100.0)
Platelets: 289 10*3/uL (ref 150–400)
RBC: 4.85 MIL/uL (ref 3.87–5.11)
RDW: 15.5 % (ref 11.5–15.5)
WBC: 6.1 10*3/uL (ref 4.0–10.5)
nRBC: 0 % (ref 0.0–0.2)

## 2023-02-06 LAB — BASIC METABOLIC PANEL
Anion gap: 15 (ref 5–15)
BUN: 14 mg/dL (ref 6–20)
CO2: 24 mmol/L (ref 22–32)
Calcium: 9 mg/dL (ref 8.9–10.3)
Chloride: 101 mmol/L (ref 98–111)
Creatinine, Ser: 0.79 mg/dL (ref 0.44–1.00)
GFR, Estimated: >60 mL/min (ref >60)
Glucose, Bld: 114 mg/dL — ABNORMAL HIGH (ref 70–99)
Potassium: 3.6 mmol/L (ref 3.5–5.1)
Sodium: 140 mmol/L (ref 135–145)

## 2023-02-06 NOTE — Telephone Encounter (Signed)
  Chief Complaint: Chest pain and difficulty breathing. Hx of pleural effusion Symptoms: Chest pain, SOB, diarrhea Frequency: Friday Pertinent Negatives: Patient denies  Disposition: [x] ED /[] Urgent Care (no appt availability in office) / [] Appointment(In office/virtual)/ []  [facility]/ [] Home Care/ [] Refused Recommended Disposition /[]  Mobile Bus/ []  Follow-up with PCP Additional Notes: Pt states that she has had a few instances of chest pain. Episodes  have been short. Pt also reports SOB, where she is unable to climb a set of stairs without being winded. Pt reports diarrhea as well. Pt states that pain and SOB started w/o any precipitating s/s. Pt has hx of pleural effusion. PT will go to ED for care.   Reason for Disposition  Difficulty breathing  Answer Assessment - Initial Assessment Questions 1. LOCATION: "Where does it hurt?"       Different spots - above breast bone right side, then left side and then back 2. RADIATION: "Does the pain go anywhere else?" (e.g., into neck, jaw, arms, back)     no 3. ONSET: "When did the chest pain begin?" (Minutes, hours or days)      Friday 4. PATTERN: "Does the pain come and go, or has it been constant since it started?"  "Does it get worse with exertion?"      Comes and goes 5. DURATION: "How long does it last" (e.g., seconds, minutes, hours)     A couple of seconds 6. SEVERITY: "How bad is the pain?"  (e.g., Scale 1-10; mild, moderate, or severe)    - MILD (1-3): doesn't interfere with normal activities     - MODERATE (4-7): interferes with normal activities or awakens from sleep    - SEVERE (8-10): excruciating pain, unable to do any normal activities       moderate 7. CARDIAC RISK FACTORS: "Do you have any history of heart problems or risk factors for heart disease?" (e.g., angina, prior heart attack; diabetes, high blood pressure, high cholesterol, smoker, or strong family history of heart disease)     HTN 8.  PULMONARY RISK FACTORS: "Do you have any history of lung disease?"  (e.g., blood clots in lung, asthma, emphysema, birth control pills)     No 9. CAUSE: "What do you think is causing the chest pain?"     Prednisone 10. OTHER SYMPTOMS: "Do you have any other symptoms?" (e.g., dizziness, nausea, vomiting, sweating, fever, difficulty breathing, cough)       Difficulty breathing, diarrhea  Protocols used: Chest Pain-A-AH

## 2023-02-06 NOTE — ED Triage Notes (Signed)
Pt here from home with c/o chest pain and sob off and on since Friday , no n/v

## 2023-02-06 NOTE — ED Notes (Signed)
Pt left without being seen.

## 2023-02-07 NOTE — Telephone Encounter (Signed)
Noted  

## 2023-02-12 ENCOUNTER — Other Ambulatory Visit: Payer: Self-pay | Admitting: Internal Medicine

## 2023-02-12 DIAGNOSIS — F411 Generalized anxiety disorder: Secondary | ICD-10-CM

## 2023-02-12 DIAGNOSIS — I10 Essential (primary) hypertension: Secondary | ICD-10-CM

## 2023-02-12 DIAGNOSIS — E538 Deficiency of other specified B group vitamins: Secondary | ICD-10-CM | POA: Diagnosis not present

## 2023-02-13 NOTE — Telephone Encounter (Signed)
Appointment 03/05/23 Requested Prescriptions  Pending Prescriptions Disp Refills   amLODipine (NORVASC) 10 MG tablet [Pharmacy Med Name: amlodipine 10 mg tablet] 30 tablet 0    Sig: TAKE ONE TABLET BY MOUTH DAILY     Cardiovascular: Calcium Channel Blockers 2 Failed - 02/12/2023  1:45 PM      Failed - Last BP in normal range    BP Readings from Last 1 Encounters:  02/06/23 (!) 150/97         Passed - Last Heart Rate in normal range    Pulse Readings from Last 1 Encounters:  02/06/23 (!) 103         Passed - Valid encounter within last 6 months    Recent Outpatient Visits           4 months ago Essential hypertension   [place] [facility] Marcine Matar, MD   8 months ago Essential hypertension   [place] [facility] Marcine Matar, MD   1 year ago Establishing care with new doctor, encounter for   [facility] Marcine Matar, MD       Future Appointments             In 2 weeks Marcine Matar, MD [facility]             busPIRone (BUSPAR) 5 MG tablet [Pharmacy Med Name: buspirone 5 mg tablet] 180 tablet 1    Sig: TAKE ONE TABLET BY MOUTH ONCE OR TWICE DAILY (FOR anxiety)     Psychiatry: Anxiolytics/Hypnotics - Non-controlled Passed - 02/12/2023  1:45 PM      Passed - Valid encounter within last 12 months    Recent Outpatient Visits           4 months ago Essential hypertension   [place] [facility] Marcine Matar, MD   8 months ago Essential hypertension   [place] [facility] Marcine Matar, MD   1 year ago Establishing care with new doctor, encounter for   [facility] Marcine Matar, MD       Future Appointments             In 2 weeks Marcine Matar, MD [facility]

## 2023-02-26 ENCOUNTER — Ambulatory Visit: Payer: Self-pay

## 2023-02-26 ENCOUNTER — Ambulatory Visit: Payer: 59 | Admitting: Nurse Practitioner

## 2023-02-26 NOTE — Telephone Encounter (Signed)
Chief Complaint: Dizziness Symptoms: Moderate Dizziness like room is spinning, headache, right earache Frequency: constant  Pertinent Negatives: Patient denies nausea, vomiting, fever Disposition: [] ED /[] Urgent Care (no appt availability in office) / [x] Appointment(In office/virtual)/ []  [facility]/ [] Home Care/ [] Refused Recommended Disposition /[] [place] [facility]/ []  Follow-up with PCP Additional Notes: Patient stated she woke up and felt dizzy this morning like the room is spinning. Patient states that the dizziness is worse when she stands up. Patient also reports a headache for the past 2 days and right earache. Patient states the symptoms are not getting any better. Care advice given and patient has been scheduled to be seen today at 1610.  Summary: dizziness   Pt called has dizziness     Reason for Disposition  [1] MODERATE dizziness (e.g., vertigo; feels very unsteady, interferes with normal activities) AND [2] has NOT been evaluated by doctor (or NP/PA) for this  Answer Assessment - Initial Assessment Questions 1. DESCRIPTION: "Describe your dizziness."     I feel like I'm moving  2. VERTIGO: "Do you feel like either you or the room is spinning or tilting?"      Like the room is spinning 3. LIGHTHEADED: "Do you feel lightheaded?" (e.g., somewhat faint, woozy, weak upon standing)     No 4. SEVERITY: "How bad is it?"  "Can you walk?"   - MILD: Feels slightly dizzy and unsteady, but is walking normally.   - MODERATE: Feels unsteady when walking, but not falling; interferes with normal activities (e.g., school, work).   - SEVERE: Unable to walk without falling, or requires assistance to walk without falling.     Moderate 5. ONSET:  "When did the dizziness begin?"     This morning 6. AGGRAVATING FACTORS: "Does anything make it worse?" (e.g., standing, change in head position)     Standing up  7. CAUSE: "What do you think is causing the dizziness?"     I don't  know 8. RECURRENT SYMPTOM: "Have you had dizziness before?" If Yes, ask: "When was the last time?" "What happened that time?"     I have but it's been a while 9. OTHER SYMPTOMS: "Do you have any other symptoms?" (e.g., headache, weakness, numbness, vomiting, earache)     Headache, right earache  Protocols used: Dizziness - Vertigo-A-AH

## 2023-03-01 ENCOUNTER — Ambulatory Visit: Payer: 59 | Admitting: Internal Medicine

## 2023-03-05 ENCOUNTER — Other Ambulatory Visit (HOSPITAL_COMMUNITY)
Admission: RE | Admit: 2023-03-05 | Discharge: 2023-03-05 | Disposition: A | Discharge status: Home / Self Care | Payer: 59 | Source: Ambulatory Visit | Attending: Internal Medicine | Admitting: Internal Medicine

## 2023-03-05 ENCOUNTER — Ambulatory Visit: Payer: 59 | Attending: Internal Medicine | Admitting: Internal Medicine

## 2023-03-05 ENCOUNTER — Encounter: Payer: Self-pay | Admitting: Internal Medicine

## 2023-03-05 VITALS — BP 135/88 | HR 84 | Temp 98.3°F | Ht 61.0 in | Wt 202.0 lb

## 2023-03-05 DIAGNOSIS — F320 Major depressive disorder, single episode, mild: Secondary | ICD-10-CM

## 2023-03-05 DIAGNOSIS — Z1231 Encounter for screening mammogram for malignant neoplasm of breast: Secondary | ICD-10-CM

## 2023-03-05 DIAGNOSIS — Z1151 Encounter for screening for human papillomavirus (HPV): Secondary | ICD-10-CM | POA: Insufficient documentation

## 2023-03-05 DIAGNOSIS — Z23 Encounter for immunization: Secondary | ICD-10-CM

## 2023-03-05 DIAGNOSIS — Z01419 Encounter for gynecological examination (general) (routine) without abnormal findings: Secondary | ICD-10-CM | POA: Diagnosis present

## 2023-03-05 DIAGNOSIS — Z124 Encounter for screening for malignant neoplasm of cervix: Secondary | ICD-10-CM | POA: Diagnosis not present

## 2023-03-05 DIAGNOSIS — I10 Essential (primary) hypertension: Secondary | ICD-10-CM | POA: Diagnosis not present

## 2023-03-05 MED ORDER — POTASSIUM CHLORIDE CRYS ER 10 MEQ PO TBCR: 10.0000 meq | EXTENDED_RELEASE_TABLET | Freq: Every day | ORAL | 6 refills | Status: DC

## 2023-03-05 MED ORDER — HYDROCHLOROTHIAZIDE 12.5 MG PO TABS: 12.5000 mg | ORAL_TABLET | Freq: Every day | ORAL | 3 refills | Status: DC

## 2023-03-05 NOTE — Patient Instructions (Addendum)
Your blood pressure is not at goal.  We have added another medication called hydrochlorothiazide along with a potassium supplement.  After you have been on this medication for 1 week, please return To the laboratory to have kidney and potassium level checked.

## 2023-03-05 NOTE — Progress Notes (Signed)
Patient ID: Michele Lang, female    DOB: 20-Jun-1975  MRN: [id number]  CC: Gynecologic Exam (Pap./Flu vax administered on 03/05/23 - C.A.)   Subjective: Michele Lang is a 47 y.o. female who presents for PAP. Her concerns today include:  Patient with history of HTN, RA with positive RA factor, anxiety, anemia   GYN History:  Pt is G4P3 (1 miscarriage) Any hx of abn paps?: no Menses regular or irregular?:  regular How long does menses last? 5-10 days Menstrual flow light or heavy?: moderate to heavy Method of birth control?:  no Any vaginal dischg at this time?:  no Dysuria?: no Any hx of STI?: no Sexually active with how many partners: one female partner Desires STI screen:  yes Last MMG: 1 yr ago through [facility] Family hx of uterine, cervical or breast cancer?: no  HTN: on Norvasc 10 mg daily and took already today Was checking BP but machine no longer works   Pos depression screen: reports feeling stress and worrying a lot ; a lot going on at home but pt did not elaborate No SI  Would like to be referred for counseling Was on any depressant one time in past around COVID pandemic.  Took it only one and stopped because it made her feel anxious.  HM:  yes to flu vac Patient Active Problem List   Diagnosis Date Noted   Rheumatoid arthritis involving multiple sites with positive rheumatoid factor (HCC) 01/24/2022   Iron deficiency anemia 01/24/2022   GAD (generalized anxiety disorder) 01/24/2022   Axillary lymphadenopathy 09/06/2020   Chest pain, atypical 11/22/2013   Essential hypertension 01/04/2013   Cough 01/04/2013   Pleural effusion 01/03/2013     Current Outpatient Medications on File Prior to Visit  Medication Sig Dispense Refill   acetaminophen (TYLENOL) 650 MG CR tablet Take 650 mg by mouth as needed for pain.     amLODipine (NORVASC) 10 MG tablet TAKE ONE TABLET BY MOUTH DAILY 30 tablet 0   busPIRone (BUSPAR) 5 MG tablet TAKE ONE TABLET BY  MOUTH ONCE OR TWICE DAILY (FOR anxiety) 180 tablet 0   EPINEPHrine 0.3 mg/0.3 mL IJ SOAJ injection Inject 0.3 mLs into the muscle as needed.     ibuprofen (ADVIL) 600 MG tablet Take 1 tablet (600 mg total) by mouth every 8 (eight) hours as needed. 21 tablet 0   OVER THE COUNTER MEDICATION B 12 injection, one injection per month.     predniSONE (DELTASONE) 5 MG tablet Take 5 mg by mouth 2 (two) times daily.     Tofacitinib Citrate ER 11 MG TB24 Take 11 mg by mouth daily.     Tofacitinib Citrate ER (XELJANZ XR) 11 MG TB24 Take by mouth.     No current facility-administered medications on file prior to visit.    Allergies  Allergen Reactions   Infliximab Anaphylaxis   Cozaar [Losartan Potassium] Other (See Comments)    Caused CP   Penicillins Rash    Has patient had a PCN reaction causing immediate rash, facial/tongue/throat swelling, SOB or lightheadedness with hypotension: Yes Has patient had a PCN reaction causing severe rash involving mucus membranes or skin necrosis: Yes Has patient had a PCN reaction that required hospitalization No Has patient had a PCN reaction occurring within the last 10 years: No If all of the above answers are "NO", then may proceed with Cephalosporin use.     Social History   Socioeconomic History   Marital status: Single  Spouse name: Not on file   Number of children: Not on file   Years of education: Not on file   Highest education level: Not on file  Occupational History   Not on file  Tobacco Use   Smoking status: Never   Smokeless tobacco: Never  Vaping Use   Vaping status: Never Used  Substance and Sexual Activity   Alcohol use: No   Drug use: No   Sexual activity: Never  Other Topics Concern   Not on file  Social History Narrative   Not on file   Social Determinants of Health   Financial Resource Strain: Medium Risk (03/05/2023)   Overall Financial Resource Strain (CARDIA)    Difficulty of Paying Living Expenses: Somewhat hard   Food Insecurity: Food Insecurity Present (03/05/2023)   Hunger Vital Sign    Worried About Running Out of Food in the Last Year: Sometimes true    Ran Out of Food in the Last Year: Sometimes true  Transportation Needs: Unmet Transportation Needs (06/30/2022)   PRAPARE - Transportation    Lack of Transportation (Medical): Yes    Lack of Transportation (Non-Medical): Yes  Physical Activity: Inactive (03/05/2023)   Exercise Vital Sign    Days of Exercise per Week: 0 days    Minutes of Exercise per Session: 0 min  Stress: Stress Concern Present (03/05/2023)   [employer] of Occupational Health - Occupational Stress Questionnaire    Feeling of Stress : Rather much  Social Connections: Socially Isolated (03/05/2023)   Social Connection and Isolation Panel [NHANES]    Frequency of Communication with Friends and Family: Once a week    Frequency of Social Gatherings with Friends and Family: Never    Attends Religious Services: Never    Database administrator or Organizations: Yes    Attends Banker Meetings: Never    Marital Status: Never married  Intimate Partner Violence: Not At Risk (03/05/2023)   Humiliation, Afraid, Rape, and Kick questionnaire    Fear of Current or Ex-Partner: No    Emotionally Abused: No    Physically Abused: No    Sexually Abused: No    Family History  Problem Relation Age of Onset   Hypertension Mother    Heart disease Mother    Hypertension Father    Hypertension Sister    Depression Maternal Uncle    Depression Maternal Grandmother    Rheum arthritis Maternal Grandmother    Asthma Son    Allergies Son    Colon cancer Neg Hx    Esophageal cancer Neg Hx    Rectal cancer Neg Hx    Stomach cancer Neg Hx     Past Surgical History:  Procedure Laterality Date   BREAST SURGERY     FOOT SURGERY      ROS: Review of Systems Negative except as stated above  PHYSICAL EXAM: BP 135/88 (BP Location: Left Arm, Patient Position: Sitting,  Cuff Size: Large)   Pulse 84   Temp 98.3 F (36.8 C) (Oral)   Ht 5\' 1"  (1.549 m)   Wt 202 lb (91.6 kg)   SpO2 96%   BMI 38.17 kg/m   Physical Exam BP 145/103 General appearance -middle-age African-American female who appears chronically ill and in NAD. Mental status - normal mood, behavior, speech, dress, motor activity, and thought processes Pelvic -my CMA Clarissa is present: Normal external genitalia, vulva, vagina, cervix,  and adnexa.  Uterus feels top normal size     03/05/2023  2:58 PM 10/13/2022    3:47 PM 06/12/2022    4:32 PM  Depression screen PHQ 2/9  Decreased Interest 2 1 1   Down, Depressed, Hopeless 2 1 1   PHQ - 2 Score 4 2 2   Altered sleeping 2 1 1   Tired, decreased energy 2 1 0  Change in appetite 2 0 1  Feeling bad or failure about yourself  2 1 1   Trouble concentrating 1 1 1   Moving slowly or fidgety/restless 0 0 0  Suicidal thoughts 0 0 0  PHQ-9 Score 13 6 6   Difficult doing work/chores Somewhat difficult         Latest Ref Rng & Units 02/06/2023    3:59 PM 08/15/2020    1:09 PM 08/01/2018    5:20 PM  CMP  Glucose 70 - 99 mg/dL 454  85  098   BUN 6 - 20 mg/dL 14  8  15    Creatinine 0.44 - 1.00 mg/dL 1.19  1.47  8.29   Sodium 135 - 145 mmol/L 140  133  134   Potassium 3.5 - 5.1 mmol/L 3.6  3.7  3.8   Chloride 98 - 111 mmol/L 101  102  102   CO2 22 - 32 mmol/L 24  24  25    Calcium 8.9 - 10.3 mg/dL 9.0  8.5  8.9   Total Protein 6.5 - 8.1 g/dL   7.8   Total Bilirubin 0.3 - 1.2 mg/dL   0.5   Alkaline Phos 38 - 126 U/L   58   AST 15 - 41 U/L   17   ALT 0 - 44 U/L   13    Lipid Panel  No results found for: "CHOL", "TRIG", "HDL", "CHOLHDL", "VLDL", "LDLCALC", "LDLDIRECT"  CBC    Component Value Date/Time   WBC 6.1 02/06/2023 1559   RBC 4.85 02/06/2023 1559   HGB 11.3 (L) 02/06/2023 1559   HGB 9.9 (L) 01/24/2022 1504   HCT 37.8 02/06/2023 1559   HCT 33.7 (L) 01/24/2022 1504   PLT 289 02/06/2023 1559   PLT 332 01/24/2022 1504   MCV 77.9 (L)  02/06/2023 1559   MCV 73 (L) 01/24/2022 1504   MCH 23.3 (L) 02/06/2023 1559   MCHC 29.9 (L) 02/06/2023 1559   RDW 15.5 02/06/2023 1559   RDW 19.1 (H) 01/24/2022 1504   LYMPHSABS 1.7 07/25/2018 1921   MONOABS 0.0 (L) 07/25/2018 1921   EOSABS 0.2 07/25/2018 1921   BASOSABS 0.1 07/25/2018 1921    ASSESSMENT AND PLAN: 1. Pap smear for cervical cancer screening - Cytology - PAP - Cervicovaginal ancillary only  2. Encounter for screening mammogram for malignant neoplasm of breast - MM Digital Screening; Future  3. Essential hypertension Not at goal.  She will continue Norvasc 10 mg daily.  Discussed adding low-dose of HCTZ.  We will also add potassium supplement with it.  After being on the medicine for about 1 week, I recommend that she returns to the lab to have kidney function and potassium level checked.  She is agreeable to doing so. - hydrochlorothiazide (HYDRODIURIL) 12.5 MG tablet; Take 1 tablet (12.5 mg total) by mouth daily.  Dispense: 90 tablet; Refill: 3 - potassium chloride (KLOR-CON M) 10 MEQ tablet; Take 1 tablet (10 mEq total) by mouth daily.  Dispense: 30 tablet; Refill: 6 - Basic Metabolic Panel; Future  4. Encounter for immunization - Flu vaccine trivalent PF, 6mos and older(Flulaval,Afluria,Fluarix,Fluzone)  5. Major depressive disorder, single episode, mild (HCC) Patient was  not sure whether she wanted to be placed on medication or not.  However she did request referral to behavioral health.  Referral submitted. - Ambulatory referral to Psychiatry     Patient was given the opportunity to ask questions.  Patient verbalized understanding of the plan and was able to repeat key elements of the plan.   This documentation was completed using Paediatric nurse.  Any transcriptional errors are unintentional.  Orders Placed This Encounter  Procedures   MM Digital Screening   Flu vaccine trivalent PF, 6mos and older(Flulaval,Afluria,Fluarix,Fluzone)    Basic Metabolic Panel   Ambulatory referral to Psychiatry     Requested Prescriptions   Signed Prescriptions Disp Refills   hydrochlorothiazide (HYDRODIURIL) 12.5 MG tablet 90 tablet 3    Sig: Take 1 tablet (12.5 mg total) by mouth daily.   potassium chloride (KLOR-CON M) 10 MEQ tablet 30 tablet 6    Sig: Take 1 tablet (10 mEq total) by mouth daily.    Return in about 4 weeks (around 04/02/2023) for give lab appt for 1 week.  Jonah Blue, MD, FACP

## 2023-03-06 LAB — CERVICOVAGINAL ANCILLARY ONLY
Bacterial Vaginitis (gardnerella): NEGATIVE
Candida Glabrata: NEGATIVE
Candida Vaginitis: NEGATIVE
Chlamydia: NEGATIVE
Comment: NEGATIVE
Comment: NEGATIVE
Comment: NEGATIVE
Comment: NEGATIVE
Comment: NEGATIVE
Comment: NORMAL
Neisseria Gonorrhea: NEGATIVE
Trichomonas: NEGATIVE

## 2023-03-08 ENCOUNTER — Ambulatory Visit (HOSPITAL_COMMUNITY)
Admission: EM | Admit: 2023-03-08 | Discharge: 2023-03-08 | Disposition: A | Discharge status: Home / Self Care | Payer: 59 | Attending: Psychiatry | Admitting: Psychiatry

## 2023-03-08 DIAGNOSIS — F339 Major depressive disorder, recurrent, unspecified: Secondary | ICD-10-CM | POA: Diagnosis not present

## 2023-03-08 DIAGNOSIS — Z560 Unemployment, unspecified: Secondary | ICD-10-CM | POA: Insufficient documentation

## 2023-03-08 DIAGNOSIS — F338 Other recurrent depressive disorders: Secondary | ICD-10-CM | POA: Insufficient documentation

## 2023-03-08 DIAGNOSIS — Z638 Other specified problems related to primary support group: Secondary | ICD-10-CM | POA: Diagnosis present

## 2023-03-08 DIAGNOSIS — F411 Generalized anxiety disorder: Secondary | ICD-10-CM | POA: Diagnosis not present

## 2023-03-08 DIAGNOSIS — F419 Anxiety disorder, unspecified: Secondary | ICD-10-CM | POA: Diagnosis present

## 2023-03-08 NOTE — Addendum Note (Signed)
Addended by: Jonah Blue B on: 03/08/2023 05:31 PM   Modules accepted: Orders

## 2023-03-08 NOTE — ED Provider Notes (Signed)
Behavioral Health Urgent Care Medical Screening Exam  Patient Name: Michele Lang MRN: [id number] Date of Evaluation: 03/09/23 Chief Complaint:  been stress which as increase my anxiety Diagnosis:  Final diagnoses:  GAD (generalized anxiety disorder)  Recurrent major depressive disorder, remission status unspecified (HCC)    History of Present illness: Michele Lang is a 47 y.o. female. With with a history of anxiety and depression, patient was taking medicine up to 2 years ago but stopped taking it.  Presented to [facility] voluntarily.  According to the patient she has been having increased stress at home due to the fact that her son had moved back in with his girlfriend.  According to patient she lives with her 3 children and her mother.  Patient is unemployed stated she is on disability.  According to the patient she is currently not seeing a psychiatrist or therapist but she would like to see 1 so she can get some medications.  Discussed with patient the option of walking psychiatry and also patient was provided with handout resources to contact outside providers. Patient denies any hospitalization for any psychiatric problems.   Face-to-face observation of patient, patient is alert and oriented x 4, speech is clear, maintaining eye contact.  Patient denies SI, HI, AVH or paranoia.  Denies illicit drug use denies alcohol denies smoking.  Denies access to guns denies any threat to herself or others. At this time patient does not seem to be influenced by external or internal stimuli.  Patient is educated about calling 911 or going to the nearest ED should she experience any suicidal ideation/homicidal ideation/hallucination patient verbalized understanding.  At the completion of this assessment patient was both mentally and medically clear for discharge.  Patient to follow-up with outpatient resources provided and or follow-up with walk-in psychiatry.  Patient however stated she was  going to reach out to her PCP for medicines.  Recommend discharge for patient to follow-up with outpatient provider  Flowsheet Row ED from 03/08/2023 in [facility] ED from 02[facility]06/2023 in [facility] Department at [facility] ED from 08/19/2022 in [facility] at [facility])  C-SSRS RISK CATEGORY No Risk No Risk No Risk       Psychiatric Specialty Exam  Presentation  General Appearance:Casual  Eye Contact:Good  Speech:Clear and Coherent  Speech Volume:Normal  Handedness:No data recorded  Mood and Affect  Mood: Anxious; Depressed  Affect: Appropriate   Thought Process  Thought Processes: Coherent  Descriptions of Associations:Intact  Orientation:Full (Time, Place and Person)  Thought Content:WDL    Hallucinations:None  Ideas of Reference:None  Suicidal Thoughts:No  Homicidal Thoughts:No   Sensorium  Memory: Immediate Good  Judgment: Fair  Insight: Good   Executive Functions  Concentration: Good  Attention Span: Good  Recall: Good  Fund of Knowledge: Good  Language: Good   Psychomotor Activity  Psychomotor Activity: Normal   Assets  Assets: Desire for Improvement   Sleep  Sleep: Fair  Number of hours:  6   Physical Exam: Physical Exam HENT:     Head: Normocephalic.     Nose: Nose normal.  Eyes:     Pupils: Pupils are equal, round, and reactive to light.  Pulmonary:     Effort: Pulmonary effort is normal.  Musculoskeletal:        General: Normal range of motion.     Cervical back: Normal range of motion.  Neurological:     General: No focal deficit present.  Mental Status: She is alert.  Psychiatric:        Mood and Affect: Mood normal.        Thought Content: Thought content normal.    Review of Systems  Constitutional: Negative.   HENT: Negative.    Eyes: Negative.   Respiratory: Negative.    Cardiovascular: Negative.    Gastrointestinal: Negative.   Genitourinary: Negative.   Musculoskeletal: Negative.   Skin: Negative.   Neurological: Negative.   Psychiatric/Behavioral:  Positive for depression. The patient is nervous/anxious.    Blood pressure 135/86, pulse 89, temperature 98.7 F (37.1 C), temperature source Oral, resp. rate 17, SpO2 98%. There is no height or weight on file to calculate BMI.  Musculoskeletal: Strength & Muscle Tone: within normal limits Gait & Station: normal Patient leans: N/A   [facility] MSE Discharge Disposition for Follow up and Recommendations: Based on my evaluation the patient does not appear to have an emergency medical condition and can be discharged with resources and follow up care in outpatient services for Medication Management and Individual Therapy   Sindy Guadeloupe, NP 03/09/2023, 5:54 AM

## 2023-03-08 NOTE — Progress Notes (Signed)
   03/08/23 1642  [facility] Triage Screening (Walk-ins at [facility] only)  How Did You Hear About [place]? Self  What Is the Reason for Your Visit/Call Today? Pt presents to [facility] voluntarily unaccompanied. Pt states that she has been going through issues at home that are causing anxiety. Pt states that her son, his girlfriend and pets have been in the home for about a year now.  How Long Has This Been Causing You Problems? > than 6 months  Have You Recently Had Any Thoughts About Hurting Yourself? No  Are You Planning to Commit Suicide/Harm Yourself At This time? No  Have you Recently Had Thoughts About Hurting Someone Karolee Ohs? No  Are You Planning To Harm Someone At This Time? No  Are you currently experiencing any auditory, visual or other hallucinations? No  Have You Used Any Alcohol or Drugs in the Past 24 Hours? No  Do you have any current medical co-morbidities that require immediate attention? No  Clinician description of patient physical appearance/behavior: casually dressed, calm, cooperative  What Do You Feel Would Help You the Most Today? Social Support;Treatment for Depression or other mood problem;Medication(s)  If access to [facility] was not available, would you have sought care in the Emergency Department? No  Determination of Need Routine (7 days)  Options For Referral Medication Management;Outpatient Therapy

## 2023-03-09 ENCOUNTER — Telehealth: Payer: Self-pay

## 2023-03-09 NOTE — Telephone Encounter (Signed)
Pt given lab results per notes of Dr. Laural Benes on 03/06/23. Pt verbalized understanding.  Screening test for chlamydia, gonorrhea, trichomonas and vaginal yeast negative.  Await results of the Pap smear.  Written by Marcine Matar, MD on 03/06/2023  6:14 PM EDT Seen by patient Michele Lang on 03/08/2023  3:07 PM

## 2023-03-12 ENCOUNTER — Other Ambulatory Visit: Payer: 59

## 2023-03-12 ENCOUNTER — Telehealth: Payer: Self-pay | Admitting: Internal Medicine

## 2023-03-12 NOTE — Telephone Encounter (Signed)
Phone call placed to patient today to go over the results of her Pap smear.  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  I have explained to her what ASCUS is.  I have asked the lab to run the subtype on the HPV.  If subtypes are positive, we will refer to gynecology for further evaluation and management.  I told her I will get back to her once I get the results of the subtyping.

## 2023-03-13 ENCOUNTER — Other Ambulatory Visit: Payer: Self-pay | Admitting: Internal Medicine

## 2023-03-13 DIAGNOSIS — R87618 Other abnormal cytological findings on specimens from cervix uteri: Secondary | ICD-10-CM

## 2023-03-13 DIAGNOSIS — R87610 Atypical squamous cells of undetermined significance on cytologic smear of cervix (ASC-US): Secondary | ICD-10-CM

## 2023-03-13 LAB — CYTOLOGY - PAP
Comment: NEGATIVE
Comment: NEGATIVE
Comment: NEGATIVE
Diagnosis: UNDETERMINED — AB
HPV 16: NEGATIVE
HPV 18 / 45: NEGATIVE
High risk HPV: POSITIVE — AB

## 2023-04-02 ENCOUNTER — Ambulatory Visit: Payer: Self-pay | Admitting: Internal Medicine

## 2023-04-09 ENCOUNTER — Ambulatory Visit: Payer: Self-pay | Admitting: *Deleted

## 2023-04-09 DIAGNOSIS — M069 Rheumatoid arthritis, unspecified: Secondary | ICD-10-CM | POA: Diagnosis not present

## 2023-04-09 DIAGNOSIS — Z79899 Other long term (current) drug therapy: Secondary | ICD-10-CM | POA: Diagnosis not present

## 2023-04-09 DIAGNOSIS — E538 Deficiency of other specified B group vitamins: Secondary | ICD-10-CM | POA: Diagnosis not present

## 2023-04-09 DIAGNOSIS — I10 Essential (primary) hypertension: Secondary | ICD-10-CM

## 2023-04-09 DIAGNOSIS — M0579 Rheumatoid arthritis with rheumatoid factor of multiple sites without organ or systems involvement: Secondary | ICD-10-CM | POA: Diagnosis not present

## 2023-04-09 NOTE — Telephone Encounter (Signed)
  Chief Complaint: Medication question: Patient is out of Amlodipine(1 week)- never had lab follow up  Disposition: [] ED /[] Urgent Care (no appt availability in office) / [] Appointment(In office/virtual)/ []  [place] [facility]/ [] Home Care/ [] Refused Recommended Disposition /[] [place] [facility]/ [x]  Follow-up with PCP Additional Notes: Patient has been scheduled for lab tomorrow- does she need Rx now- or wait?

## 2023-04-09 NOTE — Telephone Encounter (Signed)
Summary: med ?   Pt called in wanting to know if her med, was switched to hydrochlorothiazide from amlodipine or is she supposed to take both. She was told by pharmacy that it ws switched         Reason for Disposition . [1] Caller has URGENT medicine question about med that PCP or specialist prescribed AND [2] triager unable to answer question  Answer Assessment - Initial Assessment Questions 1. NAME of MEDICINE: "What medicine(s) are you calling about?"     Amlodipine, hydrochlorothiazide and Potasium 2. QUESTION: "What is your question?" (e.g., double dose of medicine, side effect)     Patient has been out of BP medication for 1 week- does she still need this? 3. PRESCRIBER: "Who prescribed the medicine?" Reason: if prescribed by specialist, call should be referred to that group.     PCP Per last OV:  Essential hypertension Not at goal.  She will continue Norvasc 10 mg daily.  Discussed adding low-dose of HCTZ.  We will also add potassium supplement with it.  After being on the medicine for about 1 week, I recommend that she returns to the lab to have kidney function and potassium level checked.  She is agreeable to doing so. - hydrochlorothiazide (HYDRODIURIL) 12.5 MG tablet; Take 1 tablet (12.5 mg total) by mouth daily.  Dispense: 90 tablet; Refill: 3 - potassium chloride (KLOR-CON M) 10 MEQ tablet; Take 1 tablet (10 mEq total) by mouth daily.  Dispense: 30 tablet; Refill: 6 - Basic Metabolic Panel; Future  Patient advised- she never had labs- sorry this was missed with pap follow up/results- but she has scheduled labs for tomorrow. Patient has been out of Amlodipine for 1 week- does she need to wait of does she need to continue to take?  Protocols used: Medication Question Call-A-AH

## 2023-04-10 ENCOUNTER — Other Ambulatory Visit: Payer: 59

## 2023-04-10 MED ORDER — AMLODIPINE BESYLATE 10 MG PO TABS: 10.0000 mg | ORAL_TABLET | Freq: Every day | ORAL | 0 refills | Status: DC

## 2023-04-10 NOTE — Telephone Encounter (Signed)
She states she was unable to come in today because she does not feel well.  Advised patient of office hours to return for lab.  Verbalized understanding.   Patient advised Dr. Henriette Combs recommendations, below:  3. Essential hypertension Not at goal.  She will continue Norvasc 10 mg daily.  Discussed adding low-dose of HCTZ.  We will also add potassium supplement with it.  After being on the medicine for about 1 week, I recommend that she returns to the lab to have kidney function and potassium level checked.  She is agreeable to doing so. - hydrochlorothiazide (HYDRODIURIL) 12.5 MG tablet; Take 1 tablet (12.5 mg total) by mouth daily.  Dispense: 90 tablet; Refill: 3 - potassium chloride (KLOR-CON M) 10 MEQ tablet; Take 1 tablet (10 mEq total) by mouth daily.  Dispense: 30 tablet; Refill: 6 - Basic Metabolic Panel; Future

## 2023-04-10 NOTE — Addendum Note (Signed)
Addended by: Guy Franco on: 04/10/2023 10:03 AM   Modules accepted: Orders

## 2023-05-07 ENCOUNTER — Ambulatory Visit: Payer: Self-pay

## 2023-05-07 ENCOUNTER — Telehealth: Payer: Self-pay | Admitting: Internal Medicine

## 2023-05-07 NOTE — Telephone Encounter (Signed)
Pt calling in for flu like sx and unable to hear pt due to very loud rushing sound like wind. Advised pt will call back.

## 2023-05-07 NOTE — Telephone Encounter (Signed)
Chief Complaint: Non-Productive Cough Symptoms: Dry Cough, chest congestion, runny nose, headache, mild SOB, hoarse voice Frequency: constant x 3 days Pertinent Negatives: Patient denies fever, chest pain, nausea, vomiting Disposition: [] ED /[] Urgent Care (no appt availability in office) / [] Appointment(In office/virtual)/ [x]  [place] [facility]/ [] Home Care/ [] Refused Recommended Disposition /[] [place] [facility]/ []  Follow-up with PCP Additional Notes: Patient states she has had a dry hacking cough for 3 days with a runny nose, chest congestion, headache, mild SOB and a hoarse voice. Patient states she has been taking OTC cough medicines and decongestants and  patient feels like symptoms are progressively getting worse. Care advice given and no appointments available in office. Patient has been scheduled for a virtual urgent care visit tomorrow at noon.  Reason for Disposition  [1] Continuous (nonstop) coughing interferes with work or school AND [2] no improvement using cough treatment per Care Advice  Answer Assessment - Initial Assessment Questions 1. ONSET: "When did the cough begin?"      3 days ago  2. SEVERITY: "How bad is the cough today?"      Moderate to Severe 3. SPUTUM: "Describe the color of your sputum" (none, dry cough; clear, white, yellow, green)     Dry  4. HEMOPTYSIS: "Are you coughing up any blood?" If so ask: "How much?" (flecks, streaks, tablespoons, etc.)     No  5. DIFFICULTY BREATHING: "Are you having difficulty breathing?" If Yes, ask: "How bad is it?" (e.g., mild, moderate, severe)    - MILD: No SOB at rest, mild SOB with walking, speaks normally in sentences, can lie down, no retractions, pulse < 100.    - MODERATE: SOB at rest, SOB with minimal exertion and prefers to sit, cannot lie down flat, speaks in phrases, mild retractions, audible wheezing, pulse 100-120.    - SEVERE: Very SOB at rest, speaks in single words, struggling to breathe, sitting hunched  forward, retractions, pulse > 120      Mild SOB only when coughing a lot  6. FEVER: "Do you have a fever?" If Yes, ask: "What is your temperature, how was it measured, and when did it start?"     No  7. CARDIAC HISTORY: "Do you have any history of heart disease?" (e.g., heart attack, congestive heart failure)      High blood pressure  8. LUNG HISTORY: "Do you have any history of lung disease?"  (e.g., pulmonary embolus, asthma, emphysema)     No  9. PE RISK FACTORS: "Do you have a history of blood clots?" (or: recent major surgery, recent prolonged travel, bedridden)     No  10. OTHER SYMPTOMS: "Do you have any other symptoms?" (e.g., runny nose, wheezing, chest pain)       Runny nose, chest congestion, headache, hoarse voice  12. TRAVEL: "Have you traveled out of the country in the last month?" (e.g., travel history, exposures)       No  Protocols used: Cough - Acute Non-Productive-A-AH

## 2023-05-07 NOTE — Telephone Encounter (Signed)
Noted. Patient has appointment with Family Medicine on  05/08/2023 at 12:00 PM

## 2023-05-07 NOTE — Telephone Encounter (Signed)
Pt is calling in returning a call from the nurse.

## 2023-05-08 ENCOUNTER — Telehealth: Payer: 59 | Admitting: Physician Assistant

## 2023-05-08 DIAGNOSIS — J208 Acute bronchitis due to other specified organisms: Secondary | ICD-10-CM | POA: Diagnosis not present

## 2023-05-08 DIAGNOSIS — B9689 Other specified bacterial agents as the cause of diseases classified elsewhere: Secondary | ICD-10-CM

## 2023-05-08 MED ORDER — AZITHROMYCIN 250 MG PO TABS: ORAL_TABLET | ORAL | 0 refills | Status: AC

## 2023-05-08 MED ORDER — PROMETHAZINE-DM 6.25-15 MG/5ML PO SYRP: 5.0000 mL | ORAL_SOLUTION | Freq: Four times a day (QID) | ORAL | 0 refills | Status: DC | PRN

## 2023-05-08 MED ORDER — ALBUTEROL SULFATE HFA 108 (90 BASE) MCG/ACT IN AERS: 1.0000 | INHALATION_SPRAY | Freq: Four times a day (QID) | RESPIRATORY_TRACT | 0 refills | Status: AC | PRN

## 2023-05-08 MED ORDER — PREDNISONE 20 MG PO TABS: 40.0000 mg | ORAL_TABLET | Freq: Every day | ORAL | 0 refills | Status: DC

## 2023-05-08 MED ORDER — BENZONATATE 100 MG PO CAPS: 100.0000 mg | ORAL_CAPSULE | Freq: Three times a day (TID) | ORAL | 0 refills | Status: DC | PRN

## 2023-05-08 NOTE — Progress Notes (Signed)
Virtual Visit Consent   [name], you are scheduled for a virtual visit with a [facility] provider today. Just as with appointments in the office, your consent must be obtained to participate. Your consent will be active for this visit and any virtual visit you may have with one of our providers in the next 365 days. If you have a MyChart account, a copy of this consent can be sent to you electronically.  As this is a virtual visit, video technology does not allow for your provider to perform a traditional examination. This may limit your provider's ability to fully assess your condition. If your provider identifies any concerns that need to be evaluated in person or the need to arrange testing (such as labs, EKG, etc.), we will make arrangements to do so. Although advances in technology are sophisticated, we cannot ensure that it will always work on either your end or our end. If the connection with a video visit is poor, the visit may have to be switched to a telephone visit. With either a video or telephone visit, we are not always able to ensure that we have a secure connection.  By engaging in this virtual visit, you consent to the provision of healthcare and authorize for your insurance to be billed (if applicable) for the services provided during this visit. Depending on your insurance coverage, you may receive a charge related to this service.  I need to obtain your verbal consent now. Are you willing to proceed with your visit today? [name] has provided verbal consent on 05/08/2023 for a virtual visit (video or telephone). Margaretann Loveless, PA-C  Date: 05/08/2023 11:54 AM  Virtual Visit via Video Note   I, Margaretann Loveless, connected with  Michele Lang  ([id number], May 28, 1975) (47) on 05/08/23 at 12:00 PM EST by a video-enabled telemedicine application and verified that I am speaking with the correct person using two identifiers.  Location: Patient:  Virtual Visit Location Patient: Home Provider: Virtual Visit Location Provider: Home Office   I discussed the limitations of evaluation and management by telemedicine and the availability of in person appointments. The patient expressed understanding and agreed to proceed.    History of Present Illness: Michele Lang is a 47 y.o. who identifies as a female who was assigned female at birth, and is being seen today for cough and congestion.  HPI: Cough This is a new problem. The current episode started in the past 7 days (4 days). The problem has been gradually worsening. The cough is Productive of sputum and productive of purulent sputum. Associated symptoms include chills, headaches, myalgias, nasal congestion, postnasal drip and a sore throat. Pertinent negatives include no ear congestion, ear pain, fever, rhinorrhea, shortness of breath or wheezing. The symptoms are aggravated by lying down. Treatments tried: nyquil honey cough syrup, robitussin, benadryl, coricidin hbp. The treatment provided no relief. Her past medical history is significant for pneumonia. There is no history of asthma or bronchitis.     Problems:  Patient Active Problem List   Diagnosis Date Noted   Rheumatoid arthritis involving multiple sites with positive rheumatoid factor (HCC) 01/24/2022   Iron deficiency anemia 01/24/2022   GAD (generalized anxiety disorder) 01/24/2022   Axillary lymphadenopathy 09/06/2020   Chest pain, atypical 11/22/2013   Essential hypertension 01/04/2013   Cough 01/04/2013   Pleural effusion 01/03/2013    Allergies:  Allergies  Allergen Reactions   Infliximab Anaphylaxis   Cozaar [Losartan Potassium] Other (See Comments)  Caused CP   Penicillins Rash    Has patient had a PCN reaction causing immediate rash, facial/tongue/throat swelling, SOB or lightheadedness with hypotension: Yes Has patient had a PCN reaction causing severe rash involving mucus membranes or skin necrosis:  Yes Has patient had a PCN reaction that required hospitalization No Has patient had a PCN reaction occurring within the last 10 years: No If all of the above answers are "NO", then may proceed with Cephalosporin use.    Medications:  Current Outpatient Medications:    acetaminophen (TYLENOL) 650 MG CR tablet, Take 650 mg by mouth as needed for pain., Disp: , Rfl:    amLODipine (NORVASC) 10 MG tablet, Take 1 tablet (10 mg total) by mouth daily., Disp: 30 tablet, Rfl: 0   busPIRone (BUSPAR) 5 MG tablet, TAKE ONE TABLET BY MOUTH ONCE OR TWICE DAILY (FOR anxiety), Disp: 180 tablet, Rfl: 0   EPINEPHrine 0.3 mg/0.3 mL IJ SOAJ injection, Inject 0.3 mLs into the muscle as needed., Disp: , Rfl:    hydrochlorothiazide (HYDRODIURIL) 12.5 MG tablet, Take 1 tablet (12.5 mg total) by mouth daily., Disp: 90 tablet, Rfl: 3   ibuprofen (ADVIL) 600 MG tablet, Take 1 tablet (600 mg total) by mouth every 8 (eight) hours as needed., Disp: 21 tablet, Rfl: 0   OVER THE COUNTER MEDICATION, B 12 injection, one injection per month., Disp: , Rfl:    potassium chloride (KLOR-CON M) 10 MEQ tablet, Take 1 tablet (10 mEq total) by mouth daily., Disp: 30 tablet, Rfl: 6   predniSONE (DELTASONE) 5 MG tablet, Take 5 mg by mouth 2 (two) times daily., Disp: , Rfl:    Tofacitinib Citrate ER (XELJANZ XR) 11 MG TB24, Take by mouth., Disp: , Rfl:    Tofacitinib Citrate ER 11 MG TB24, Take 11 mg by mouth daily., Disp: , Rfl:   Observations/Objective: Patient is well-developed, well-nourished in no acute distress.  Resting comfortably at home.  Head is normocephalic, atraumatic.  No labored breathing.  Speech is clear and coherent with logical content.  Patient is alert and oriented at baseline.    Assessment and Plan: There are no diagnoses linked to this encounter. - Worsening over a week despite OTC medications - Will treat with Z-pack, Prednisone burst (hold her normal dose of prednisone), Albuterol, Promethazine DM, and  tessalon perles - Can continue Mucinex  - Tylenol as needed - Push fluids.  - Rest.  - Steam and humidifier can help - Seek in person evaluation if worsening or symptoms fail to improve    Follow Up Instructions: I discussed the assessment and treatment plan with the patient. The patient was provided an opportunity to ask questions and all were answered. The patient agreed with the plan and demonstrated an understanding of the instructions.  A copy of instructions were sent to the patient via MyChart unless otherwise noted below.    The patient was advised to call back or seek an in-person evaluation if the symptoms worsen or if the condition fails to improve as anticipated.    Margaretann Loveless, PA-C

## 2023-05-08 NOTE — Patient Instructions (Signed)
Michele Lang, thank you for joining Margaretann Loveless, PA-C for today's virtual visit.  While this provider is not your primary care provider (PCP), if your PCP is located in our provider database this encounter information will be shared with them immediately following your visit.   A [facility] MyChart account gives you access to today's visit and all your visits, tests, and labs performed at [facility] " click here if you don't have a [facility] MyChart account or go to [email or web address]  Consent: (Patient) Michele Lang provided verbal consent for this virtual visit at the beginning of the encounter.  Current Medications:  Current Outpatient Medications:    albuterol (VENTOLIN HFA) 108 (90 Base) MCG/ACT inhaler, Inhale 1-2 puffs into the lungs every 6 (six) hours as needed., Disp: 8 g, Rfl: 0   azithromycin (ZITHROMAX) 250 MG tablet, Take 2 tablets on day 1, then 1 tablet daily on days 2 through 5, Disp: 6 tablet, Rfl: 0   benzonatate (TESSALON) 100 MG capsule, Take 1-2 capsules (100-200 mg total) by mouth 3 (three) times daily as needed., Disp: 30 capsule, Rfl: 0   predniSONE (DELTASONE) 20 MG tablet, Take 2 tablets (40 mg total) by mouth daily with breakfast., Disp: 14 tablet, Rfl: 0   promethazine-dextromethorphan (PROMETHAZINE-DM) 6.25-15 MG/5ML syrup, Take 5 mLs by mouth 4 (four) times daily as needed., Disp: 118 mL, Rfl: 0   acetaminophen (TYLENOL) 650 MG CR tablet, Take 650 mg by mouth as needed for pain., Disp: , Rfl:    amLODipine (NORVASC) 10 MG tablet, Take 1 tablet (10 mg total) by mouth daily., Disp: 30 tablet, Rfl: 0   busPIRone (BUSPAR) 5 MG tablet, TAKE ONE TABLET BY MOUTH ONCE OR TWICE DAILY (FOR anxiety), Disp: 180 tablet, Rfl: 0   EPINEPHrine 0.3 mg/0.3 mL IJ SOAJ injection, Inject 0.3 mLs into the muscle as needed., Disp: , Rfl:    hydrochlorothiazide (HYDRODIURIL) 12.5 MG tablet, Take 1 tablet (12.5 mg total) by mouth daily.,  Disp: 90 tablet, Rfl: 3   ibuprofen (ADVIL) 600 MG tablet, Take 1 tablet (600 mg total) by mouth every 8 (eight) hours as needed., Disp: 21 tablet, Rfl: 0   OVER THE COUNTER MEDICATION, B 12 injection, one injection per month., Disp: , Rfl:    potassium chloride (KLOR-CON M) 10 MEQ tablet, Take 1 tablet (10 mEq total) by mouth daily., Disp: 30 tablet, Rfl: 6   predniSONE (DELTASONE) 5 MG tablet, Take 5 mg by mouth 2 (two) times daily., Disp: , Rfl:    Tofacitinib Citrate ER (XELJANZ XR) 11 MG TB24, Take by mouth., Disp: , Rfl:    Tofacitinib Citrate ER 11 MG TB24, Take 11 mg by mouth daily., Disp: , Rfl:    Medications ordered in this encounter:  Meds ordered this encounter  Medications   azithromycin (ZITHROMAX) 250 MG tablet    Sig: Take 2 tablets on day 1, then 1 tablet daily on days 2 through 5    Dispense:  6 tablet    Refill:  0    Supervising Provider:   Merrilee Jansky [[id number]]   predniSONE (DELTASONE) 20 MG tablet    Sig: Take 2 tablets (40 mg total) by mouth daily with breakfast.    Dispense:  14 tablet    Refill:  0    Supervising Provider:   Merrilee Jansky [[id number]]   promethazine-dextromethorphan (PROMETHAZINE-DM) 6.25-15 MG/5ML syrup    Sig: Take 5 mLs by mouth 4 (four) times daily as  needed.    Dispense:  118 mL    Refill:  0    Supervising Provider:   Merrilee Jansky [[id number]]   benzonatate (TESSALON) 100 MG capsule    Sig: Take 1-2 capsules (100-200 mg total) by mouth 3 (three) times daily as needed.    Dispense:  30 capsule    Refill:  0    Supervising Provider:   Merrilee Jansky [[id number]]   albuterol (VENTOLIN HFA) 108 (90 Base) MCG/ACT inhaler    Sig: Inhale 1-2 puffs into the lungs every 6 (six) hours as needed.    Dispense:  8 g    Refill:  0    Supervising Provider:   Merrilee Jansky [[id number]]     *If you need refills on other medications prior to your next appointment, please contact your pharmacy*  Follow-Up: Call back or seek an in-person  evaluation if the symptoms worsen or if the condition fails to improve as anticipated.  [facility] [phone]  Other Instructions Acute Bronchitis, Adult  Acute bronchitis is sudden inflammation of the main airways (bronchi) that come off the windpipe (trachea) in the lungs. The swelling causes the airways to get smaller and make more mucus than normal. This can make it hard to breathe and can cause coughing or noisy breathing (wheezing). Acute bronchitis may last several weeks. The cough may last longer. Allergies, asthma, and exposure to smoke may make the condition worse. What are the causes? This condition can be caused by germs and by substances that irritate the lungs, including: Cold and flu viruses. The most common cause of this condition is the virus that causes the common cold. Bacteria. This is less common. Breathing in substances that irritate the lungs, including: Smoke from cigarettes and other forms of tobacco. Dust and pollen. Fumes from household cleaning products, gases, or burned fuel. Indoor or outdoor air pollution. What increases the risk? The following factors may make you more likely to develop this condition: A weak body's defense system, also called the immune system. A condition that affects your lungs and breathing, such as asthma. What are the signs or symptoms? Common symptoms of this condition include: Coughing. This may bring up clear, yellow, or green mucus from your lungs (sputum). Wheezing. Runny or stuffy nose. Having too much mucus in your lungs (chest congestion). Shortness of breath. Aches and pains, including sore throat or chest. How is this diagnosed? This condition is usually diagnosed based on: Your symptoms and medical history. A physical exam. You may also have other tests, including tests to rule out other conditions, such as pneumonia. These tests include: A test of lung function. Test of a mucus sample to look for  the presence of bacteria. Tests to check the oxygen level in your blood. Blood tests. Chest X-ray. How is this treated? Most cases of acute bronchitis clear up over time without treatment. Your health care provider may recommend: Drinking more fluids to help thin your mucus so it is easier to cough up. Taking inhaled medicine (inhaler) to improve air flow in and out of your lungs. Using a vaporizer or a humidifier. These are machines that add water to the air to help you breathe better. Taking a medicine that thins mucus and clears congestion (expectorant). Taking a medicine that prevents or stops coughing (cough suppressant). It is not common to take an antibiotic medicine for this condition. Follow these instructions at home:  Take over-the-counter and prescription medicines only as told  by your health care provider. Use an inhaler, vaporizer, or humidifier as told by your health care provider. Take two teaspoons (10 mL) of honey at bedtime to lessen coughing at night. Drink enough fluid to keep your urine pale yellow. Do not use any products that contain nicotine or tobacco. These products include cigarettes, chewing tobacco, and vaping devices, such as e-cigarettes. If you need help quitting, ask your health care provider. Get plenty of rest. Return to your normal activities as told by your health care provider. Ask your health care provider what activities are safe for you. Keep all follow-up visits. This is important. How is this prevented? To lower your risk of getting this condition again: Wash your hands often with soap and water for at least 20 seconds. If soap and water are not available, use hand sanitizer. Avoid contact with people who have cold symptoms. Try not to touch your mouth, nose, or eyes with your hands. Avoid breathing in smoke or chemical fumes. Breathing smoke or chemical fumes will make your condition worse. Get the flu shot every year. Contact a health care  provider if: Your symptoms do not improve after 2 weeks. You have trouble coughing up the mucus. Your cough keeps you awake at night. You have a fever. Get help right away if you: Cough up blood. Feel pain in your chest. Have severe shortness of breath. Faint or keep feeling like you are going to faint. Have a severe headache. Have a fever or chills that get worse. These symptoms may represent a serious problem that is an emergency. Do not wait to see if the symptoms will go away. Get medical help right away. Call your local emergency services (911 in the [place] Do not drive yourself to the hospital. Summary Acute bronchitis is inflammation of the main airways (bronchi) that come off the windpipe (trachea) in the lungs. The swelling causes the airways to get smaller and make more mucus than normal. Drinking more fluids can help thin your mucus so it is easier to cough up. Take over-the-counter and prescription medicines only as told by your health care provider. Do not use any products that contain nicotine or tobacco. These products include cigarettes, chewing tobacco, and vaping devices, such as e-cigarettes. If you need help quitting, ask your health care provider. Contact a health care provider if your symptoms do not improve after 2 weeks. This information is not intended to replace advice given to you by your health care provider. Make sure you discuss any questions you have with your health care provider. Document Revised: 08/18/2021 Document Reviewed: 09/08/2020 Elsevier Patient Education  2024 Elsevier Inc.    If you have been instructed to have an in-person evaluation today at a local Urgent Care facility, please use the link below. It will take you to a list of all of our available [facility], including address, phone number and hours of operation. Please do not delay care.  [facility]  If you or a family member do not have a primary care provider,  use the link below to schedule a visit and establish care. When you choose a [facility] primary care physician or advanced practice provider, you gain a long-term partner in health. Find a Primary Care Provider  Learn more about [facility] in-office and virtual care options: [facility] - Get Care Now

## 2023-05-24 ENCOUNTER — Other Ambulatory Visit: Payer: Self-pay

## 2023-05-24 ENCOUNTER — Encounter: Payer: Self-pay | Admitting: Obstetrics and Gynecology

## 2023-05-24 ENCOUNTER — Ambulatory Visit: Payer: 59 | Admitting: Obstetrics and Gynecology

## 2023-05-24 VITALS — BP 127/83 | HR 84 | Wt 209.1 lb

## 2023-05-24 DIAGNOSIS — R87610 Atypical squamous cells of undetermined significance on cytologic smear of cervix (ASC-US): Secondary | ICD-10-CM

## 2023-05-24 DIAGNOSIS — R87810 Cervical high risk human papillomavirus (HPV) DNA test positive: Secondary | ICD-10-CM | POA: Diagnosis not present

## 2023-05-24 DIAGNOSIS — Z1331 Encounter for screening for depression: Secondary | ICD-10-CM | POA: Diagnosis not present

## 2023-05-24 NOTE — Progress Notes (Deleted)
 NEW GYNECOLOGY PATIENT Patient name: Michele Lang MRN [id number]  Date of birth: Mar 02, 1976 Chief Complaint:   Gynecologic Exam     History:  Michele Lang is a 48 y.o. [id number] being seen today for ***.         Gynecologic History Patient's last menstrual period was 05/18/2023 (approximate). Contraception: {method:5051} Last Pap: ***. Result was {norm/abn:16337} with negative HPV Last Mammogram:  2023 at [facility] Last Colonoscopy: {NA AND TPOIRJMI:78410}  Obstetric History OB History  Gravida Para Term Preterm AB Living  4 3 3  0 1 3  SAB IAB Ectopic Multiple Live Births  1 0 0 0     # Outcome Date GA Lbr Len/2nd Weight Sex Type Anes PTL Lv  4 SAB           3 Term      Vag-Spont     2 Term      Vag-Spont     1 Term      Vag-Spont       Past Medical History:  Diagnosis Date   Anemia    Anxiety    Arthritis    B12 deficiency 03/20/2022   Depression    Drug therapy 11/08/2021   GERD (gastroesophageal reflux disease)    Heart murmur    High risk medication use 05/12/2015   Hypertension    Iron deficiency 03/16/2022   Non-compliance 08/29/2016   Sleep apnea     Past Surgical History:  Procedure Laterality Date   BREAST SURGERY     FOOT SURGERY      Current Outpatient Medications on File Prior to Visit  Medication Sig Dispense Refill   acetaminophen  (TYLENOL ) 650 MG CR tablet Take 650 mg by mouth as needed for pain.     albuterol  (VENTOLIN  HFA) 108 (90 Base) MCG/ACT inhaler Inhale 1-2 puffs into the lungs every 6 (six) hours as needed. 8 g 0   amLODipine  (NORVASC ) 10 MG tablet Take 1 tablet (10 mg total) by mouth daily. 30 tablet 0   benzonatate  (TESSALON ) 100 MG capsule Take 1-2 capsules (100-200 mg total) by mouth 3 (three) times daily as needed. 30 capsule 0   busPIRone  (BUSPAR ) 5 MG tablet TAKE ONE TABLET BY MOUTH ONCE OR TWICE DAILY (FOR anxiety) 180 tablet 0   EPINEPHrine  0.3 mg/0.3 mL IJ SOAJ injection Inject 0.3 mLs into the muscle as  needed.     escitalopram (LEXAPRO) 10 MG tablet Take by mouth.     hydrochlorothiazide  (HYDRODIURIL ) 12.5 MG tablet Take 1 tablet (12.5 mg total) by mouth daily. 90 tablet 3   hydroxychloroquine (PLAQUENIL) 200 MG tablet Take by mouth.     ibuprofen  (ADVIL ) 600 MG tablet Take 1 tablet (600 mg total) by mouth every 8 (eight) hours as needed. 21 tablet 0   OVER THE COUNTER MEDICATION B 12 injection, one injection per month.     potassium chloride  (KLOR-CON  M) 10 MEQ tablet Take 1 tablet (10 mEq total) by mouth daily. 30 tablet 6   predniSONE  (DELTASONE ) 20 MG tablet Take 2 tablets (40 mg total) by mouth daily with breakfast. 14 tablet 0   predniSONE  (DELTASONE ) 5 MG tablet Take 5 mg by mouth 2 (two) times daily.     promethazine -dextromethorphan (PROMETHAZINE -DM) 6.25-15 MG/5ML syrup Take 5 mLs by mouth 4 (four) times daily as needed. 118 mL 0   Tofacitinib Citrate ER 11 MG TB24 Take 11 mg by mouth daily.     No current facility-administered medications on file  prior to visit.    Allergies  Allergen Reactions   Infliximab  Anaphylaxis   Cozaar  [Losartan  Potassium] Other (See Comments)    Caused CP   Penicillins Rash    Has patient had a PCN reaction causing immediate rash, facial/tongue/throat swelling, SOB or lightheadedness with hypotension: Yes  Has patient had a PCN reaction causing severe rash involving mucus membranes or skin necrosis: Yes  Has patient had a PCN reaction that required hospitalization No  Has patient had a PCN reaction occurring within the last 10 years: No  If all of the above answers are NO, then may proceed with Cephalosporin use.  Has patient had a PCN reaction causing immediate rash, facial/tongue/throat swelling, SOB or lightheadedness with hypotension: Yes, Has patient had a PCN reaction causing severe rash involving mucus membranes or skin necrosis: Yes, Has patient had a PCN reaction that required hospitalization No, Has patient had a PCN reaction  occurring within the last 10 years: No, If all of the above answers are NO, then may proceed with Cephalosporin use.    Social History:  reports that she has never smoked. She has never used smokeless tobacco. She reports that she does not drink alcohol and does not use drugs.  Family History  Problem Relation Age of Onset   Hypertension Mother    Heart disease Mother    Hypertension Father    Hypertension Sister    Depression Maternal Uncle    Depression Maternal Grandmother    Rheum arthritis Maternal Grandmother    Asthma Son    Allergies Son    Colon cancer Neg Hx    Esophageal cancer Neg Hx    Rectal cancer Neg Hx    Stomach cancer Neg Hx     The following portions of the patient's history were reviewed and updated as appropriate: allergies, current medications, past family history, past medical history, past social history, past surgical history and problem list.  Review of Systems Pertinent items noted in HPI and remainder of comprehensive ROS otherwise negative.  Physical Exam:  BP 127/83   Pulse 84   Wt 209 lb 1.6 oz (94.8 kg)   LMP 05/18/2023 (Approximate)   BMI 39.51 kg/m  Physical Exam     Assessment and Plan:   There are no diagnoses linked to this encounter.   Routine preventative health maintenance measures emphasized. Please refer to After Visit Summary for other counseling recommendations.   Follow-up: No follow-ups on file.      Carter Quarry, MD Obstetrician & Gynecologist, Faculty Practice [facility] for [employer], [facility]

## 2023-05-24 NOTE — Progress Notes (Signed)
 NEW GYNECOLOGY PATIENT Patient name: Michele Lang MRN [id number]  Date of birth: [birth year] Chief Complaint:   Gynecologic Exam     History:  Michele Lang is a 48 y.o. [phone] being seen today for abnormal pap smear.    Discussed the use of AI scribe software for clinical note transcription with the patient, who gave verbal consent to proceed.  History of Present Illness   The patient, with a recent abnormal Pap smear, was referred for a second opinion. She reports a similar occurrence over ten years ago during pregnancy, but no intervention was required at that time. The patient did not express any current symptoms or concerns related to this issue. She has not been sexually active recently. The patient's last mammogram was performed at an outside facility last year.         Gynecologic History Patient's last menstrual period was 05/18/2023 (approximate). Last Pap:     Component Value Date/Time   DIAGPAP (A) 03/05/2023 1535    - Atypical squamous cells of undetermined significance (ASC-US )   HPVHIGH Positive (A) 03/05/2023 1535   ADEQPAP  03/05/2023 1535    Satisfactory for evaluation; transformation zone component PRESENT.    Last Mammogram:  reports normal in 2024 Last Colonoscopy: n/a  Obstetric History OB History  Gravida Para Term Preterm AB Living  4 3 3  0 1 3  SAB IAB Ectopic Multiple Live Births  1 0 0 0     # Outcome Date GA Lbr Len/2nd Weight Sex Type Anes PTL Lv  4 SAB           3 Term      Vag-Spont     2 Term      Vag-Spont     1 Term      Vag-Spont       Past Medical History:  Diagnosis Date   Anemia    Anxiety    Arthritis    B12 deficiency 03/20/2022   Depression    Drug therapy 11/08/2021   GERD (gastroesophageal reflux disease)    Heart murmur    High risk medication use 05/12/2015   Hypertension    Iron deficiency 03/16/2022   Non-compliance 08/29/2016   Sleep apnea     Past Surgical History:  Procedure Laterality  Date   BREAST SURGERY     FOOT SURGERY      Current Outpatient Medications on File Prior to Visit  Medication Sig Dispense Refill   acetaminophen  (TYLENOL ) 650 MG CR tablet Take 650 mg by mouth as needed for pain.     albuterol  (VENTOLIN  HFA) 108 (90 Base) MCG/ACT inhaler Inhale 1-2 puffs into the lungs every 6 (six) hours as needed. 8 g 0   amLODipine  (NORVASC ) 10 MG tablet Take 1 tablet (10 mg total) by mouth daily. 30 tablet 0   benzonatate  (TESSALON ) 100 MG capsule Take 1-2 capsules (100-200 mg total) by mouth 3 (three) times daily as needed. 30 capsule 0   busPIRone  (BUSPAR ) 5 MG tablet TAKE ONE TABLET BY MOUTH ONCE OR TWICE DAILY (FOR anxiety) 180 tablet 0   EPINEPHrine  0.3 mg/0.3 mL IJ SOAJ injection Inject 0.3 mLs into the muscle as needed.     escitalopram (LEXAPRO) 10 MG tablet Take by mouth.     hydrochlorothiazide  (HYDRODIURIL ) 12.5 MG tablet Take 1 tablet (12.5 mg total) by mouth daily. 90 tablet 3   hydroxychloroquine (PLAQUENIL) 200 MG tablet Take by mouth.     ibuprofen  (ADVIL ) 600 MG  tablet Take 1 tablet (600 mg total) by mouth every 8 (eight) hours as needed. 21 tablet 0   OVER THE COUNTER MEDICATION B 12 injection, one injection per month.     potassium chloride  (KLOR-CON  M) 10 MEQ tablet Take 1 tablet (10 mEq total) by mouth daily. 30 tablet 6   predniSONE  (DELTASONE ) 20 MG tablet Take 2 tablets (40 mg total) by mouth daily with breakfast. 14 tablet 0   predniSONE  (DELTASONE ) 5 MG tablet Take 5 mg by mouth 2 (two) times daily.     promethazine -dextromethorphan (PROMETHAZINE -DM) 6.25-15 MG/5ML syrup Take 5 mLs by mouth 4 (four) times daily as needed. 118 mL 0   Tofacitinib Citrate ER 11 MG TB24 Take 11 mg by mouth daily.     No current facility-administered medications on file prior to visit.    Allergies  Allergen Reactions   Infliximab  Anaphylaxis   Cozaar  [Losartan  Potassium] Other (See Comments)    Caused CP   Penicillins Rash    Has patient had a PCN reaction  causing immediate rash, facial/tongue/throat swelling, SOB or lightheadedness with hypotension: Yes  Has patient had a PCN reaction causing severe rash involving mucus membranes or skin necrosis: Yes  Has patient had a PCN reaction that required hospitalization No  Has patient had a PCN reaction occurring within the last 10 years: No  If all of the above answers are NO, then may proceed with Cephalosporin use.  Has patient had a PCN reaction causing immediate rash, facial/tongue/throat swelling, SOB or lightheadedness with hypotension: Yes, Has patient had a PCN reaction causing severe rash involving mucus membranes or skin necrosis: Yes, Has patient had a PCN reaction that required hospitalization No, Has patient had a PCN reaction occurring within the last 10 years: No, If all of the above answers are NO, then may proceed with Cephalosporin use.    Social History:  reports that she has never smoked. She has never used smokeless tobacco. She reports that she does not drink alcohol and does not use drugs.  Family History  Problem Relation Age of Onset   Hypertension Mother    Heart disease Mother    Hypertension Father    Hypertension Sister    Depression Maternal Uncle    Depression Maternal Grandmother    Rheum arthritis Maternal Grandmother    Asthma Son    Allergies Son    Colon cancer Neg Hx    Esophageal cancer Neg Hx    Rectal cancer Neg Hx    Stomach cancer Neg Hx     The following portions of the patient's history were reviewed and updated as appropriate: allergies, current medications, past family history, past medical history, past social history, past surgical history and problem list.  Review of Systems Pertinent items noted in HPI and remainder of comprehensive ROS otherwise negative.  Physical Exam:  BP 127/83   Pulse 84   Wt 209 lb 1.6 oz (94.8 kg)   LMP 05/18/2023 (Approximate)   BMI 39.51 kg/m  Physical Exam Vitals and nursing note reviewed.   Constitutional:      Appearance: Normal appearance.  Cardiovascular:     Rate and Rhythm: Normal rate.  Pulmonary:     Effort: Pulmonary effort is normal.  Neurological:     General: No focal deficit present.     Mental Status: She is alert and oriented to person, place, and time.  Psychiatric:        Mood and Affect: Mood normal.  Behavior: Behavior normal.        Thought Content: Thought content normal.        Judgment: Judgment normal.       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 No prior procedures for abnormal Pap smear. Discussed the need for colposcopy to rule out pre-cancerous changes. -Schedule colposcopy. -Advise patient to take ibuprofen  or Tylenol  prior to procedure.  Breast Cancer Screening Last mammogram performed at [facility] last year. -Continue annual mammograms as per guidelines.       Routine preventative health maintenance measures emphasized. Please refer to After Visit Summary for other counseling recommendations.   Follow-up: No follow-ups on file.      Carter Quarry, MD Obstetrician & Gynecologist, Faculty [facility] for [employer], [facility]

## 2023-05-31 ENCOUNTER — Ambulatory Visit: Payer: Self-pay

## 2023-05-31 NOTE — Telephone Encounter (Signed)
 Noted. Patient has appointment schedule with OBGYN for 06/20/2022

## 2023-05-31 NOTE — Telephone Encounter (Signed)
  Chief Complaint: Vaginal bleeding Symptoms: Pt recently finished her period and is now bleeding again.  Frequency: today Pertinent Negatives: Patient denies pain Disposition: [] ED /[] Urgent Care (no appt availability in office) / [] Appointment(In office/virtual)/ []  [facility]/ [] Home Care/ [] Refused Recommended Disposition /[] [facility]/ [x]  Follow-up with PCP Additional Notes: Pt has a very regular cycles occurring at the end of the month. Pt's cycle finishes , but has begun to bleed again today. Pt recently went to gyn for a repeat of an abnormal pap smear. Pt will call gyn to schedule if unable to be seen. Pt will call back to see Dr. Vicci.  Reason for Disposition  [1] Bleeding or spotting between regular periods AND [2] occurs three cycles (3 months) or less this past year  Answer Assessment - Initial Assessment Questions 1. AMOUNT: Describe the bleeding that you are having.    - SPOTTING: spotting, or pinkish / brownish mucous discharge; does not fill panty liner or pad    - MILD:  less than 1 pad / hour; less than patient's usual menstrual bleeding   - MODERATE: 1-2 pads / hour; 1 menstrual cup every 6 hours; small-medium blood clots (e.g., pea, grape, small coin)   - SEVERE: soaking 2 or more pads/hour for 2 or more hours; 1 menstrual cup every 2 hours; bleeding not contained by pads or continuous red blood from vagina; large blood clots (e.g., golf ball, large coin)      moderate 2. ONSET: When did the bleeding begin? Is it continuing now?     today 3. MENSTRUAL PERIOD: When was the last normal menstrual period? How is this different than your period?     Starts at the end of the month.  4. REGULARITY: How regular are your periods?     Regular 5. ABDOMEN PAIN: Do you have any pain? How bad is the pain?  (e.g., Scale 1-10; mild, moderate, or severe)   - MILD (1-3): doesn't interfere with normal activities, abdomen soft and not tender to  touch    - MODERATE (4-7): interferes with normal activities or awakens from sleep, abdomen tender to touch    - SEVERE (8-10): excruciating pain, doubled over, unable to do any normal activities      no 6. PREGNANCY: Is there any chance you are pregnant? When was your last menstrual period?     no 8. HORMONE MEDICINES: Are you taking any hormone medicines, prescription or over-the-counter? (e.g., birth control pills, estrogen)     no 9. BLOOD THINNER MEDICINES: Do you take any blood thinners? (e.g., Coumadin / warfarin, Pradaxa / dabigatran, aspirin )     Yes -  12. OTHER SYMPTOMS: What other symptoms are you having with the bleeding? (e.g., passed tissue, vaginal discharge, fever, menstrual-type cramps)       Stress - recently had bronchitis. Needs a 2nd pap smear.  Protocols used: Vaginal Bleeding - Abnormal-A-AH

## 2023-06-06 DIAGNOSIS — E538 Deficiency of other specified B group vitamins: Secondary | ICD-10-CM | POA: Diagnosis not present

## 2023-06-07 DIAGNOSIS — M1711 Unilateral primary osteoarthritis, right knee: Secondary | ICD-10-CM | POA: Diagnosis not present

## 2023-06-08 ENCOUNTER — Other Ambulatory Visit: Payer: Self-pay | Admitting: Internal Medicine

## 2023-06-08 DIAGNOSIS — I10 Essential (primary) hypertension: Secondary | ICD-10-CM

## 2023-06-15 ENCOUNTER — Other Ambulatory Visit: Payer: Self-pay | Admitting: Internal Medicine

## 2023-06-15 DIAGNOSIS — I10 Essential (primary) hypertension: Secondary | ICD-10-CM

## 2023-06-15 NOTE — Telephone Encounter (Signed)
Copied from CRM [id number]. Topic: Clinical - Medication Refill >> Jun 15, 2023 11:50 AM Adelina Mings wrote: Most Recent Primary Care Visit:  Provider: Jonah Blue B  Department: [facility]  Visit Type: OFFICE VISIT  Date: 03/05/2023  Medication: amLODipine (NORVASC) 10 MG tablet   Has the patient contacted their pharmacy? Yes (Agent: If no, request that the patient contact the pharmacy for the refill. If patient does not wish to contact the pharmacy document the reason why and proceed with request.) (Agent: If yes, when and what did the pharmacy advise?)  Is this the correct pharmacy for this prescription? Yes If no, delete pharmacy and type the correct one.  This is the patient's preferred pharmacy:  [facility] - [place], [place] - [address] [place] [address] Phone: [phone] Fax: [phone]   Has the prescription been filled recently? No  Is the patient out of the medication? Yes  Has the patient been seen for an appointment in the last year OR does the patient have an upcoming appointment? Yes  Can we respond through MyChart? No  Agent: Please be advised that Rx refills may take up to 3 business days. We ask that you follow-up with your pharmacy.

## 2023-06-21 ENCOUNTER — Other Ambulatory Visit: Payer: Self-pay | Admitting: Internal Medicine

## 2023-06-21 ENCOUNTER — Ambulatory Visit: Payer: 59 | Admitting: Obstetrics and Gynecology

## 2023-06-21 ENCOUNTER — Other Ambulatory Visit: Payer: Self-pay

## 2023-06-21 ENCOUNTER — Other Ambulatory Visit (HOSPITAL_COMMUNITY)
Admission: RE | Admit: 2023-06-21 | Discharge: 2023-06-21 | Disposition: A | Discharge status: Home / Self Care | Payer: 59 | Source: Ambulatory Visit | Attending: Obstetrics and Gynecology | Admitting: Obstetrics and Gynecology

## 2023-06-21 ENCOUNTER — Telehealth: Payer: Self-pay

## 2023-06-21 VITALS — BP 145/96 | HR 87 | Wt 211.6 lb

## 2023-06-21 DIAGNOSIS — Z1331 Encounter for screening for depression: Secondary | ICD-10-CM | POA: Diagnosis not present

## 2023-06-21 DIAGNOSIS — R87610 Atypical squamous cells of undetermined significance on cytologic smear of cervix (ASC-US): Secondary | ICD-10-CM

## 2023-06-21 DIAGNOSIS — Z3202 Encounter for pregnancy test, result negative: Secondary | ICD-10-CM | POA: Diagnosis not present

## 2023-06-21 DIAGNOSIS — I10 Essential (primary) hypertension: Secondary | ICD-10-CM

## 2023-06-21 DIAGNOSIS — R87810 Cervical high risk human papillomavirus (HPV) DNA test positive: Secondary | ICD-10-CM

## 2023-06-21 NOTE — Progress Notes (Signed)
    GYNECOLOGY OFFICE COLPOSCOPY PROCEDURE NOTE  48 y.o. [id number] here for colposcopy for ASCUS with POSITIVE high risk HPV pap smear on 02/2023. Discussed role for HPV in cervical dysplasia, need for surveillance.  Patient gave informed written consent, time out was performed.  Placed in lithotomy position. Cervix viewed with speculum and colposcope after application of acetic acid.   Colposcopy adequate? Yes  acetowhite lesion(s) noted at 12 o'clock; corresponding biopsies obtained.  ECC specimen obtained. All specimens were labeled and sent to pathology.  Chaperone was present during entire procedure.  Patient was given post procedure instructions.  Will follow up pathology and manage accordingly; patient will be contacted with results and recommendations.  Routine preventative health maintenance measures emphasized.    Lorriane Shire, MD, FACOG Minimally Invasive Gynecologic Surgery  Obstetrics and Gynecology, [facility] for [facility], [facility] 06/21/2023

## 2023-06-21 NOTE — Telephone Encounter (Signed)
Copied from CRM [id number]. Topic: Clinical - Request for Lab/Test Order >> Jun 21, 2023 12:38 PM Clayton Bibles wrote: Reason for CRM: Charna called to have her amLODipine (NORVASC) 10 MG tablet refill. Doctor put a note that she needs labs before refill. I did not see an order to labs. Please have Doctor put in orders for labs. I will make her a lab appointment.

## 2023-06-22 LAB — POCT PREGNANCY, URINE: Preg Test, Ur: NEGATIVE

## 2023-06-22 NOTE — Telephone Encounter (Signed)
Refill for Norvasc sent to pharmacy by PCP

## 2023-06-25 ENCOUNTER — Other Ambulatory Visit: Payer: 59

## 2023-06-25 LAB — SURGICAL PATHOLOGY

## 2023-06-26 ENCOUNTER — Encounter: Payer: Self-pay | Admitting: Obstetrics and Gynecology

## 2023-06-27 DIAGNOSIS — Z9181 History of falling: Secondary | ICD-10-CM | POA: Diagnosis not present

## 2023-06-27 DIAGNOSIS — M25661 Stiffness of right knee, not elsewhere classified: Secondary | ICD-10-CM | POA: Diagnosis not present

## 2023-06-27 DIAGNOSIS — Z7409 Other reduced mobility: Secondary | ICD-10-CM | POA: Diagnosis not present

## 2023-06-27 DIAGNOSIS — M1711 Unilateral primary osteoarthritis, right knee: Secondary | ICD-10-CM | POA: Diagnosis not present

## 2023-06-27 DIAGNOSIS — Z789 Other specified health status: Secondary | ICD-10-CM | POA: Diagnosis not present

## 2023-06-27 DIAGNOSIS — G8929 Other chronic pain: Secondary | ICD-10-CM | POA: Diagnosis not present

## 2023-06-27 DIAGNOSIS — M25561 Pain in right knee: Secondary | ICD-10-CM | POA: Diagnosis not present

## 2023-07-03 ENCOUNTER — Ambulatory Visit: Payer: 59 | Attending: Internal Medicine

## 2023-07-03 VITALS — Wt 205.0 lb

## 2023-07-03 DIAGNOSIS — Z5900 Homelessness unspecified: Secondary | ICD-10-CM

## 2023-07-03 DIAGNOSIS — Z5941 Food insecurity: Secondary | ICD-10-CM | POA: Diagnosis not present

## 2023-07-03 DIAGNOSIS — Z5986 Financial insecurity: Secondary | ICD-10-CM

## 2023-07-03 DIAGNOSIS — Z0000 Encounter for general adult medical examination without abnormal findings: Secondary | ICD-10-CM

## 2023-07-03 DIAGNOSIS — Z599 Problem related to housing and economic circumstances, unspecified: Secondary | ICD-10-CM | POA: Diagnosis not present

## 2023-07-03 NOTE — Patient Instructions (Signed)
Ms. Michele Lang , Thank you for taking time to come for your Medicare Wellness Visit. I appreciate your ongoing commitment to your health goals. Please review the following plan we discussed and let me know if I can assist you in the future.   Referrals/Orders/Follow-Ups/Clinician Recommendations: Yes; Keep maintaining your health by keeping your appointments with Dr. Laural Benes and any specialists that you may see.  Call us if you need anything.  Have a great year!!!!  This is a list of the screening recommended for you and due dates:  Health Maintenance  Topic Date Due   DTaP/Tdap/Td vaccine (1 - Tdap) Never done   COVID-19 Vaccine (2 - Pfizer risk series) 04/03/2020   Medicare Annual Wellness Visit  07/02/2024   Pap with HPV screening  03/04/2028   Colon Cancer Screening  06/20/2032   Flu Shot  Completed   Hepatitis C Screening  Completed   HIV Screening  Completed   Pneumococcal Vaccination  Aged Out   HPV Vaccine  Aged Out    Advanced directives: (Declined) Advance directive discussed with you today. Even though you declined this today, please call our office should you change your mind, and we can give you the proper paperwork for you to fill out.  Next Medicare Annual Wellness Visit scheduled for next year: Yes

## 2023-07-03 NOTE — Progress Notes (Signed)
Subjective:   Michele Lang is a 48 y.o. female who presents for Medicare Annual (Subsequent) preventive examination.  Visit Complete: Virtual I connected with  Michele Lang on 07/03/23 by a audio enabled telemedicine application and verified that I am speaking with the correct person using two identifiers.  Patient Location: Home  Provider Location: Office/Clinic  I discussed the limitations of evaluation and management by telemedicine. The patient expressed understanding and agreed to proceed.  Vital Signs: Because this visit was a virtual/telehealth visit, some criteria may be missing or patient reported. Any vitals not documented were not able to be obtained and vitals that have been documented are patient reported.  Interactive audio and video telecommunications were attempted between this provider and patient, however failed, due to patient having technical difficulties OR patient did not have access to video capability.  We continued and completed visit with audio only.  Cardiac Risk Factors include: advanced age (>62men, >82 women);family history of premature cardiovascular disease;hypertension;sedentary lifestyle     Objective:    Today's Vitals   07/03/23 1122  Weight: 205 lb (93 kg)  PainSc: 0-No pain   Body mass index is 38.73 kg/m.     07/03/2023   11:24 AM 06/30/2022    2:04 PM 07/25/2018    6:11 PM 02/09/2016   10:33 PM 02/09/2016    6:00 PM 12/27/2015   11:07 AM 09/24/2014   11:04 AM  Advanced Directives  Does Patient Have a Medical Advance Directive? No No No No No No No  Would patient like information on creating a medical advance directive? No - Patient declined Yes (ED - Information included in AVS) No - Patient declined  No - patient declined information No - patient declined information     Current Medications (verified) Outpatient Encounter Medications as of 07/03/2023  Medication Sig   acetaminophen (TYLENOL) 650 MG CR tablet Take 650 mg  by mouth as needed for pain.   albuterol (VENTOLIN HFA) 108 (90 Base) MCG/ACT inhaler Inhale 1-2 puffs into the lungs every 6 (six) hours as needed.   amLODipine (NORVASC) 10 MG tablet Take 1 tablet (10 mg total) by mouth daily. Please schedule appt with Dr. Laural Benes for more refills.   benzonatate (TESSALON) 100 MG capsule Take 1-2 capsules (100-200 mg total) by mouth 3 (three) times daily as needed.   busPIRone (BUSPAR) 5 MG tablet TAKE ONE TABLET BY MOUTH ONCE OR TWICE DAILY (FOR anxiety)   EPINEPHrine 0.3 mg/0.3 mL IJ SOAJ injection Inject 0.3 mLs into the muscle as needed.   escitalopram (LEXAPRO) 10 MG tablet Take by mouth.   hydrochlorothiazide (HYDRODIURIL) 12.5 MG tablet Take 1 tablet (12.5 mg total) by mouth daily.   hydroxychloroquine (PLAQUENIL) 200 MG tablet Take by mouth.   ibuprofen (ADVIL) 600 MG tablet Take 1 tablet (600 mg total) by mouth every 8 (eight) hours as needed.   OVER THE COUNTER MEDICATION B 12 injection, one injection per month.   potassium chloride (KLOR-CON M) 10 MEQ tablet Take 1 tablet (10 mEq total) by mouth daily.   predniSONE (DELTASONE) 20 MG tablet Take 2 tablets (40 mg total) by mouth daily with breakfast.   predniSONE (DELTASONE) 5 MG tablet Take 5 mg by mouth 2 (two) times daily.   promethazine-dextromethorphan (PROMETHAZINE-DM) 6.25-15 MG/5ML syrup Take 5 mLs by mouth 4 (four) times daily as needed.   Tofacitinib Citrate ER 11 MG TB24 Take 11 mg by mouth daily.   No facility-administered encounter medications on file as  of 07/03/2023.    Allergies (verified) Infliximab, Cozaar [losartan potassium], and Penicillins   History: Past Medical History:  Diagnosis Date   Anemia    Anxiety    Arthritis    B12 deficiency 03/20/2022   Depression    Drug therapy 11/08/2021   GERD (gastroesophageal reflux disease)    Heart murmur    High risk medication use 05/12/2015   Hypertension    Iron deficiency 03/16/2022   Non-compliance 08/29/2016   Sleep  apnea    Past Surgical History:  Procedure Laterality Date   BREAST SURGERY     FOOT SURGERY     Family History  Problem Relation Age of Onset   Hypertension Mother    Heart disease Mother    Hypertension Father    Hypertension Sister    Depression Maternal Uncle    Depression Maternal Grandmother    Rheum arthritis Maternal Grandmother    Asthma Son    Allergies Son    Colon cancer Neg Hx    Esophageal cancer Neg Hx    Rectal cancer Neg Hx    Stomach cancer Neg Hx    Social History   Socioeconomic History   Marital status: Single    Spouse name: Not on file   Number of children: Not on file   Years of education: Not on file   Highest education level: Not on file  Occupational History   Not on file  Tobacco Use   Smoking status: Never   Smokeless tobacco: Never  Vaping Use   Vaping status: Never Used  Substance and Sexual Activity   Alcohol use: No   Drug use: No   Sexual activity: Never  Other Topics Concern   Not on file  Social History Narrative   Not on file   Social Drivers of Health   Financial Resource Strain: Medium Risk (07/03/2023)   Overall Financial Resource Strain (CARDIA)    Difficulty of Paying Living Expenses: Somewhat hard  Food Insecurity: Food Insecurity Present (07/03/2023)   Hunger Vital Sign    Worried About Running Out of Food in the Last Year: Sometimes true    Ran Out of Food in the Last Year: Sometimes true  Transportation Needs: Unmet Transportation Needs (07/03/2023)   PRAPARE - Transportation    Lack of Transportation (Medical): No    Lack of Transportation (Non-Medical): Yes  Physical Activity: Inactive (07/03/2023)   Exercise Vital Sign    Days of Exercise per Week: 0 days    Minutes of Exercise per Session: 0 min  Stress: Stress Concern Present (07/03/2023)   [employer] of Occupational Health - Occupational Stress Questionnaire    Feeling of Stress : Rather much  Social Connections: Socially Isolated (07/03/2023)    Social Connection and Isolation Panel [NHANES]    Frequency of Communication with Friends and Family: Once a week    Frequency of Social Gatherings with Friends and Family: Never    Attends Religious Services: Never    Database administrator or Organizations: Yes    Attends Banker Meetings: Never    Marital Status: Never married    Tobacco Counseling Counseling given: Not Answered   Clinical Intake:  Pre-visit preparation completed: Yes  Pain : No/denies pain Pain Score: 0-No pain     BMI - recorded: 38.73 Nutritional Risks: None Diabetes: No  How often do you need to have someone help you when you read instructions, pamphlets, or other written materials from your doctor or  pharmacy?: 1 - Never What is the last grade level you completed in school?: SOME COLLEGE  Interpreter Needed?: No  Information entered by :: Bracy Pepper N. Zeferino Mounts, LPN.   Activities of Daily Living    07/03/2023   11:32 AM  In your present state of health, do you have any difficulty performing the following activities:  Hearing? 0  Vision? 0  Difficulty concentrating or making decisions? 0  Walking or climbing stairs? 0  Dressing or bathing? 0  Doing errands, shopping? 0  Preparing Food and eating ? N  Using the Toilet? N  In the past six months, have you accidently leaked urine? Y  Comment waking up with leakage x 1 week  Do you have problems with loss of bowel control? N  Managing your Medications? N  Managing your Finances? N  Housekeeping or managing your Housekeeping? N    Patient Care Team: Marcine Matar, MD as PCP - General (Internal Medicine) Conley Rolls, [facility] [place], [place] as Referring Physician (Optometry)  Indicate any recent Medical Services you may have received from other than [facility] providers in the past year (date may be approximate).     Assessment:   This is a routine wellness examination for Michele Lang.  Hearing/Vision screen Hearing Screening - Comments:: Some  hearing difficulties in both ears.  Saw audiologist 2 weeks ago. Patient will be referred to ENT.  Vision Screening - Comments:: Wears rx glasses - up to date with routine eye exams with Dr. Conley Rolls at [facility]    Goals Addressed   None   Depression Screen    07/03/2023   11:31 AM 06/21/2023    5:26 PM 05/24/2023   11:46 AM 03/05/2023    2:58 PM 10/13/2022    3:47 PM 06/12/2022    4:32 PM 01/24/2022    1:58 PM  PHQ 2/9 Scores  PHQ - 2 Score 6 4 6 4 2 2 2   PHQ- 9 Score 7 13 19 13 6 6 6     Fall Risk    07/03/2023   11:28 AM 10/13/2022    3:44 PM 06/30/2022    2:04 PM 06/12/2022    3:10 PM 01/24/2022    1:58 PM  Fall Risk   Falls in the past year? 0 1 0 0 0  Number falls in past yr: 0 1 0 0 0  Injury with Fall? 0 0 0 0 0  Risk for fall due to : No Fall Risks History of fall(s) No Fall Risks No Fall Risks No Fall Risks  Follow up Falls prevention discussed;Falls evaluation completed        MEDICARE RISK AT HOME: Medicare Risk at Home Any stairs in or around the home?: Yes If so, are there any without handrails?: No Home free of loose throw rugs in walkways, pet beds, electrical cords, etc?: Yes Adequate lighting in your home to reduce risk of falls?: Yes Life alert?: No Use of a cane, walker or w/c?: No Grab bars in the bathroom?: No Shower chair or bench in shower?: No Elevated toilet seat or a handicapped toilet?: Yes  TIMED UP AND GO:  Was the test performed?  No    Cognitive Function:    07/03/2023   11:30 AM 06/30/2022    2:07 PM  MMSE - Mini Mental State Exam  Not completed: Unable to complete   Orientation to time  5  Orientation to Place  5  Registration  3  Attention/ Calculation  5  Recall  3  Language- name 2 objects  2  Language- repeat  1  Language- follow 3 step command  3  Language- read & follow direction  1  Write a sentence  1  Copy design  1  Total score  30        07/03/2023   11:30 AM 06/30/2022    2:09 PM  6CIT Screen  What Year? 0 points  0 points  What month? 0 points 0 points  What time? 0 points 0 points  Count back from 20 0 points 0 points  Months in reverse 0 points 0 points  Repeat phrase 0 points 0 points  Total Score 0 points 0 points    Immunizations Immunization History  Administered Date(s) Administered   Influenza Whole 02/20/2012   Influenza, Seasonal, Injecte, Preservative Fre 03/05/2023   Influenza,inj,Quad PF,6+ Mos 01/24/2022   PFIZER(Purple Top)SARS-COV-2 Vaccination 03/13/2020   Pneumococcal Conjugate-13 05/12/2015   Pneumococcal Polysaccharide-23 12/22/2015    TDAP status: Due, Education has been provided regarding the importance of this vaccine. Advised may receive this vaccine at local pharmacy or Health Dept. Aware to provide a copy of the vaccination record if obtained from local pharmacy or Health Dept. Verbalized acceptance and understanding.  Flu Vaccine status: Up to date  Pneumococcal vaccine status: Up to date  Covid-19 vaccine status: Declined, Education has been provided regarding the importance of this vaccine but patient still declined. Advised may receive this vaccine at local pharmacy or Health Dept.or vaccine clinic. Aware to provide a copy of the vaccination record if obtained from local pharmacy or Health Dept. Verbalized acceptance and understanding.  Qualifies for Shingles Vaccine? No   Zostavax completed No   Shingrix Completed?: No.    Education has been provided regarding the importance of this vaccine. Patient has been advised to call insurance company to determine out of pocket expense if they have not yet received this vaccine. Advised may also receive vaccine at local pharmacy or Health Dept. Verbalized acceptance and understanding.  Screening Tests Health Maintenance  Topic Date Due   DTaP/Tdap/Td (1 - Tdap) Never done   COVID-19 Vaccine (2 - Pfizer risk series) 04/03/2020   Medicare Annual Wellness (AWV)  07/02/2024   Cervical Cancer Screening (HPV/Pap Cotest)   03/04/2028   Colonoscopy  06/20/2032   INFLUENZA VACCINE  Completed   Hepatitis C Screening  Completed   HIV Screening  Completed   Pneumococcal Vaccine 32-27 Years old  Aged Out   HPV VACCINES  Aged Out    Health Maintenance  Health Maintenance Due  Topic Date Due   DTaP/Tdap/Td (1 - Tdap) Never done   COVID-19 Vaccine (2 - Pfizer risk series) 04/03/2020    Colorectal cancer screening: Type of screening: Colonoscopy. Completed 06/20/2022. Repeat every 5 years  Mammogram status: No record on file  Bone Density Scan: Never done  Lung Cancer Screening: (Low Dose CT Chest recommended if Age 19-80 years, 20 pack-year currently smoking OR have quit w/in 15years.) does not qualify.   Lung Cancer Screening Referral: no  Additional Screening:  Hepatitis C Screening: does qualify; Completed 01/24/2022  Vision Screening: Recommended annual ophthalmology exams for early detection of glaucoma and other disorders of the eye. Is the patient up to date with their annual eye exam?  Yes  Who is the provider or what is the name of the office in which the patient attends annual eye exams? [facility] [place], [place]. If pt is not established with a provider, would they  like to be referred to a provider to establish care? No .   Dental Screening: Recommended annual dental exams for proper oral hygiene   Community Resource Referral / Chronic Care Management: CRR required this visit?  No   CCM required this visit?  No     Plan:     I have personally reviewed and noted the following in the patient's chart:   Medical and social history Use of alcohol, tobacco or illicit drugs  Current medications and supplements including opioid prescriptions. Patient is not currently taking opioid prescriptions. Functional ability and status Nutritional status Physical activity Advanced directives List of other physicians Hospitalizations, surgeries, and ER visits in previous 12 months Vitals Screenings to  include cognitive, depression, and falls Referrals and appointments  In addition, I have reviewed and discussed with patient certain preventive protocols, quality metrics, and best practice recommendations. A written personalized care plan for preventive services as well as general preventive health recommendations were provided to patient.     Mickeal Needy, LPN   1/61/0960   After Visit Summary: (MyChart) Due to this being a telephonic visit, the after visit summary with patients personalized plan was offered to patient via MyChart   Nurse Notes: None at this time.

## 2023-07-05 ENCOUNTER — Telehealth: Payer: Self-pay | Admitting: *Deleted

## 2023-07-05 DIAGNOSIS — E538 Deficiency of other specified B group vitamins: Secondary | ICD-10-CM | POA: Diagnosis not present

## 2023-07-05 NOTE — Progress Notes (Signed)
Complex Care Management Note Care Guide Note  07/05/2023 Name: Michele Lang MRN: [id number] DOB: 1975/12/23   Complex Care Management Outreach Attempts: An unsuccessful telephone outreach was attempted today to offer the patient information about available complex care management services.  Follow Up Plan:  Additional outreach attempts will be made to offer the patient complex care management information and services.   Encounter Outcome:  No Answer  Gwenevere Ghazi  [facility]  [facility], [facility] Guide  Direct Dial: [phone]  Fax [phone]

## 2023-07-06 NOTE — Progress Notes (Signed)
Complex Care Management Note Care Guide Note  07/06/2023 Name: Michele Lang MRN: [id number] DOB: 1976-03-20   Complex Care Management Outreach Attempts: A second unsuccessful outreach was attempted today to offer the patient with information about available complex care management services.  Follow Up Plan:  Additional outreach attempts will be made to offer the patient complex care management information and services.   Encounter Outcome:  No Answer  Gwenevere Ghazi  [facility]  [facility], [facility] Guide  Direct Dial: [phone]  Fax [phone]

## 2023-07-10 ENCOUNTER — Ambulatory Visit: Payer: 59 | Admitting: Internal Medicine

## 2023-07-10 NOTE — Progress Notes (Signed)
Complex Care Management Note  Care Guide Note 07/10/2023 Name: Michele Lang MRN: [id number] DOB: 1975-09-13  Michele Lang is a 48 y.o. year old female who sees Marcine Matar, MD for primary care. I reached out to [employer] by phone today to offer complex care management services.  Michele Lang was given information about Complex Care Management services today including:   The Complex Care Management services include support from the care team which includes your Nurse Care Manager, Clinical Social Worker, or Pharmacist.  The Complex Care Management team is here to help remove barriers to the health concerns and goals most important to you. Complex Care Management services are voluntary, and the patient may decline or stop services at any time by request to their care team member.   Complex Care Management Consent Status: Patient agreed to services and verbal consent obtained.   Follow up plan:  Telephone appointment with complex care management team member scheduled for:  2/25  Encounter Outcome:  Patient Scheduled  Gwenevere Ghazi  [facility]  [facility], [facility] Guide  Direct Dial: [phone]  Fax [phone]

## 2023-07-12 ENCOUNTER — Encounter: Payer: Self-pay | Admitting: General Practice

## 2023-07-16 ENCOUNTER — Ambulatory Visit: Payer: 59 | Admitting: Internal Medicine

## 2023-07-17 ENCOUNTER — Ambulatory Visit: Payer: Self-pay | Admitting: Licensed Clinical Social Worker

## 2023-07-18 ENCOUNTER — Encounter: Payer: Self-pay | Admitting: Licensed Clinical Social Worker

## 2023-07-18 NOTE — Patient Outreach (Signed)
 Care Coordination   Multidisciplinary Case Review Note    07/18/2023 Name: Michele Lang MRN: [id number] DOB: [birth year]  Michele Lang is a 48 y.o. year old female who sees Michele Matar, MD for primary care.  The  multidisciplinary care team met today to review patient care needs and barriers.     Goals Addressed   None     SDOH assessments and interventions completed:  No     Care Coordination Interventions Activated:  Yes   Care Coordination Interventions:  Yes, provided  Interventions Today    Flowsheet Row Most Recent Value  Chronic Disease   Chronic disease during today's visit Hypertension (HTN), Other  [GAD, RA, Financial Strain]  General Interventions   General Interventions Discussed/Reviewed Communication with  Communication with RN, Social Work  [employer] with team to discuss patient care needs, barriers, and strategies to strengthen support]       Follow up plan: LCSW will follow up within 2-4 weeks. BSW will assist as needed with resource management  Multidisciplinary Team Attendees:   Michele Lang, RNCM Michele Lang, BSW, Ph.D Michele Lucks, LCSW  Scribe for Multidisciplinary Case Review:   Michele Lucks, LCSW [facility]  [facility], [facility] Clinical Social Worker Direct Dial: [phone]  Fax: [phone] Website: [email or web address] 10:16 AM

## 2023-07-19 DIAGNOSIS — M25561 Pain in right knee: Secondary | ICD-10-CM | POA: Diagnosis not present

## 2023-07-19 DIAGNOSIS — R6889 Other general symptoms and signs: Secondary | ICD-10-CM | POA: Diagnosis not present

## 2023-07-19 DIAGNOSIS — Z7409 Other reduced mobility: Secondary | ICD-10-CM | POA: Diagnosis not present

## 2023-07-19 DIAGNOSIS — M25661 Stiffness of right knee, not elsewhere classified: Secondary | ICD-10-CM | POA: Diagnosis not present

## 2023-07-19 DIAGNOSIS — G8929 Other chronic pain: Secondary | ICD-10-CM | POA: Diagnosis not present

## 2023-07-19 DIAGNOSIS — M1711 Unilateral primary osteoarthritis, right knee: Secondary | ICD-10-CM | POA: Diagnosis not present

## 2023-07-19 DIAGNOSIS — Z789 Other specified health status: Secondary | ICD-10-CM | POA: Diagnosis not present

## 2023-07-19 DIAGNOSIS — Z9181 History of falling: Secondary | ICD-10-CM | POA: Diagnosis not present

## 2023-07-19 NOTE — Patient Outreach (Signed)
 Care Coordination   Initial Visit Note   07/17/2023 Name: Michele Lang MRN: [id number] DOB: 29-Nov-1975  Michele Lang is a 48 y.o. year old female who sees Marcine Matar, MD for primary care. I spoke with  Michele Lang by phone today.  What matters to the patients health and wellness today?  Community Resources and Symptom Management    Goals Addressed             This Visit's Progress    Obtain Supportive Resources-LCSW   On track    Activities and task to complete in order to accomplish goals.   Keep all upcoming appointments discussed today Continue with compliance of taking medication prescribed by Doctor Implement healthy coping skills discussed to assist with management of symptoms F/up with [facility] regarding housing concerns and ability to pay rent         SDOH assessments and interventions completed:  Yes  SDOH Interventions Today    Flowsheet Row Most Recent Value  SDOH Interventions   Food Insecurity Interventions Intervention Not Indicated, Other (Comment)  [Pt reports a recent increase in SNAP benefits and does not endorse any food insecurity currently]  Housing Interventions Community Resources Provided  [Patient reports being behind on rent. Community resources discussed]  Transportation Interventions Intervention Not Indicated, Payor Benefit, Other (Comment)  [LCSW discussed local transportation resources]  Utilities Interventions Intervention Not Indicated        Care Coordination Interventions:  Yes, provided  Interventions Today    Flowsheet Row Most Recent Value  Chronic Disease   Chronic disease during today's visit Hypertension (HTN), Other  [RA, GAD]  General Interventions   General Interventions Discussed/Reviewed General Interventions Discussed, [employer], Doctor Visits  [Pt endorses increase in stress due to large rent balance. 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 LCSW discussed  various resources with pt]  Doctor Visits Discussed/Reviewed Doctor Visits Discussed  Mental Health Interventions   Mental Health Discussed/Reviewed Mental Health Discussed, Coping Strategies, Anxiety  [Pt endorsed increase in anxiety symptoms. Strategies to assist with management of symptoms discussed]  Nutrition Interventions   Nutrition Discussed/Reviewed Nutrition Discussed  Pharmacy Interventions   Pharmacy Dicussed/Reviewed Pharmacy Topics Discussed, Medication Adherence  Safety Interventions   Safety Discussed/Reviewed Safety Discussed       Follow up plan: Follow up call scheduled for 1-4 weeks    Encounter Outcome:  Patient Visit Completed   Jenel Lucks, LCSW [place]  [facility], [facility] Clinical Social Worker Direct Dial: [phone]  Fax: [phone] Website: [email or web address] 9:24 AM

## 2023-07-19 NOTE — Patient Instructions (Signed)
 Visit Information  Thank you for taking time to visit with me today. Please don't hesitate to contact me if I can be of assistance to you.   Following are the goals we discussed today:   Goals Addressed             This Visit's Progress    Obtain Supportive Resources-LCSW   On track    Activities and task to complete in order to accomplish goals.   Keep all upcoming appointments discussed today Continue with compliance of taking medication prescribed by Doctor Implement healthy coping skills discussed to assist with management of symptoms F/up with [facility] regarding housing concerns and ability to pay rent         Our next appointment is by telephone on 3/11 at 3:30 PM  Please call the care guide team at [phone] if you need to cancel or reschedule your appointment.   If you are experiencing a Mental Health or Behavioral Health Crisis or need someone to talk to, please call the Suicide and Crisis Lifeline: 988 call 911   Patient verbalizes understanding of instructions and care plan provided today and agrees to view in MyChart. Active MyChart status and patient understanding of how to access instructions and care plan via MyChart confirmed with patient.     Windy Fast [facility] Health  [facility], [facility] Clinical Social Worker Direct Dial: [phone]  Fax: [phone] Website: [email or web address] 9:25 AM

## 2023-07-20 ENCOUNTER — Telehealth: Payer: Self-pay | Admitting: *Deleted

## 2023-07-20 NOTE — Progress Notes (Signed)
 Complex Care Management Care Guide Note  07/20/2023 Name: Michele Lang MRN: [id number] DOB: 1975-10-13  Michele Lang is a 48 y.o. year old female who is a primary care patient of Marcine Matar, MD and is actively engaged with the care management team. I reached out to [employer] by phone today to assist with scheduling  with the BSW.  Follow up plan: Unsuccessful telephone outreach attempt made. A HIPAA compliant phone message was left for the patient providing contact information and requesting a return call.  Gwenevere Ghazi  [facility] Health  [facility], [facility] Guide  Direct Dial: [phone]  Fax [phone]

## 2023-07-27 NOTE — Progress Notes (Signed)
 Complex Care Management Care Guide Note  07/27/2023 Name: Michele Lang MRN: [id number] DOB: 28-Jul-1975  Michele Lang is a 48 y.o. year old female who is a primary care patient of Marcine Matar, MD and is actively engaged with the care management team. I reached out to [employer] by phone today to assist with scheduling  with the BSW.  Follow up plan: Telephone appointment with complex care management team member scheduled for:  08/08/23  Gwenevere Ghazi  [facility]  [facility], [facility] Guide  Direct Dial: [phone]  Fax [phone]

## 2023-07-31 ENCOUNTER — Ambulatory Visit: Payer: Self-pay | Admitting: Licensed Clinical Social Worker

## 2023-08-01 DIAGNOSIS — M25661 Stiffness of right knee, not elsewhere classified: Secondary | ICD-10-CM | POA: Diagnosis not present

## 2023-08-01 DIAGNOSIS — M1711 Unilateral primary osteoarthritis, right knee: Secondary | ICD-10-CM | POA: Diagnosis not present

## 2023-08-01 DIAGNOSIS — Z9181 History of falling: Secondary | ICD-10-CM | POA: Diagnosis not present

## 2023-08-01 DIAGNOSIS — Z789 Other specified health status: Secondary | ICD-10-CM | POA: Diagnosis not present

## 2023-08-01 DIAGNOSIS — G8929 Other chronic pain: Secondary | ICD-10-CM | POA: Diagnosis not present

## 2023-08-01 DIAGNOSIS — M25561 Pain in right knee: Secondary | ICD-10-CM | POA: Diagnosis not present

## 2023-08-01 DIAGNOSIS — Z7409 Other reduced mobility: Secondary | ICD-10-CM | POA: Diagnosis not present

## 2023-08-01 NOTE — Patient Outreach (Signed)
 Care Coordination   Follow Up Visit Note   07/31/2023 Name: ALTAIR STANKO MRN: [id number] DOB: 1975/09/23  ANJELINA DUNG is a 48 y.o. year old female who sees Marcine Matar, MD for primary care. I spoke with  [name] by phone today.  What matters to the patients health and wellness today?  Housing    Goals Addressed             This Visit's Progress    Obtain Supportive Resources-LCSW   On track    Activities and task to complete in order to accomplish goals.   Keep all upcoming appointments discussed today Continue with compliance of taking medication prescribed by Doctor Implement healthy coping skills discussed to assist with management of symptoms F/up with [facility] regarding housing concerns and ability to pay rent Complete preferred housing applications         SDOH assessments and interventions completed:  No     Care Coordination Interventions:  Yes, provided  Interventions Today    Flowsheet Row Most Recent Value  Chronic Disease   Chronic disease during today's visit Hypertension (HTN)  [GAD, RA, Financial Strain]  General Interventions   General Interventions Discussed/Reviewed General Interventions Reviewed, [facility], Doctor Visits  [Housing options discussed]  Doctor Visits Discussed/Reviewed Doctor Visits Reviewed  Mental Health Interventions   Mental Health Discussed/Reviewed Mental Health Reviewed, Coping Strategies, Anxiety       Follow up plan: Follow up call scheduled for 2-4 weeks    Encounter Outcome:  Patient Visit Completed   Jenel Lucks, LCSW [place]  [facility], [facility] Clinical Social Worker Direct Dial: [phone]  Fax: [phone] Website: [email or web address] 5:26 PM

## 2023-08-01 NOTE — Patient Instructions (Signed)
 Visit Information  Thank you for taking time to visit with me today. Please don't hesitate to contact me if I can be of assistance to you.   Following are the goals we discussed today:   Goals Addressed             This Visit's Progress    Obtain Supportive Resources-LCSW   On track    Activities and task to complete in order to accomplish goals.   Keep all upcoming appointments discussed today Continue with compliance of taking medication prescribed by Doctor Implement healthy coping skills discussed to assist with management of symptoms F/up with [facility] regarding housing concerns and ability to pay rent Complete preferred housing applications         Our next appointment is by telephone on 4/1 at 3:30 PM  Please call the care guide team at [phone] if you need to cancel or reschedule your appointment.   If you are experiencing a Mental Health or Behavioral Health Crisis or need someone to talk to, please call the Suicide and Crisis Lifeline: 988 call 911   Patient verbalizes understanding of instructions and care plan provided today and agrees to view in MyChart. Active MyChart status and patient understanding of how to access instructions and care plan via MyChart confirmed with patient.     Windy Fast [facility]  [facility], [facility] Clinical Social Worker Direct Dial: [phone]  Fax: [phone] Website: [email or web address] 5:27 PM

## 2023-08-08 ENCOUNTER — Other Ambulatory Visit: Payer: Self-pay

## 2023-08-08 NOTE — Patient Instructions (Signed)
  Medicaid Managed Care   Unsuccessful Outreach Note  08/08/2023 Name: RANDILYN FOISY MRN: [id number] DOB: 05-26-1975  Referred by: Marcine Matar, MD Reason for referral : High Risk Managed Medicaid (MM social work unsuccessful telephone outreach)   An unsuccessful telephone outreach was attempted today. The patient was referred to the case management team for assistance with care management and care coordination.   Follow Up Plan: A HIPAA compliant phone message was left for the patient providing contact information and requesting a return call.   Abelino Derrick, MHA [facility]  [facility] Social Worker [phone]

## 2023-08-08 NOTE — Patient Outreach (Signed)
  Medicaid Managed Care   Unsuccessful Outreach Note  08/08/2023 Name: Michele Lang MRN: [id number] DOB: 05-26-1975  Referred by: Marcine Matar, MD Reason for referral : High Risk Managed Medicaid (MM social work unsuccessful telephone outreach)   An unsuccessful telephone outreach was attempted today. The patient was referred to the case management team for assistance with care management and care coordination.   Follow Up Plan: A HIPAA compliant phone message was left for the patient providing contact information and requesting a return call.   Abelino Derrick, MHA [facility]  [facility] Social Worker [phone]

## 2023-08-14 DIAGNOSIS — Z79899 Other long term (current) drug therapy: Secondary | ICD-10-CM | POA: Diagnosis not present

## 2023-08-14 DIAGNOSIS — M0579 Rheumatoid arthritis with rheumatoid factor of multiple sites without organ or systems involvement: Secondary | ICD-10-CM | POA: Diagnosis not present

## 2023-08-14 DIAGNOSIS — D509 Iron deficiency anemia, unspecified: Secondary | ICD-10-CM | POA: Diagnosis not present

## 2023-08-15 DIAGNOSIS — M25561 Pain in right knee: Secondary | ICD-10-CM | POA: Diagnosis not present

## 2023-08-15 DIAGNOSIS — M25661 Stiffness of right knee, not elsewhere classified: Secondary | ICD-10-CM | POA: Diagnosis not present

## 2023-08-15 DIAGNOSIS — Z9181 History of falling: Secondary | ICD-10-CM | POA: Diagnosis not present

## 2023-08-15 DIAGNOSIS — G8929 Other chronic pain: Secondary | ICD-10-CM | POA: Diagnosis not present

## 2023-08-15 DIAGNOSIS — Z789 Other specified health status: Secondary | ICD-10-CM | POA: Diagnosis not present

## 2023-08-15 DIAGNOSIS — M1711 Unilateral primary osteoarthritis, right knee: Secondary | ICD-10-CM | POA: Diagnosis not present

## 2023-08-15 DIAGNOSIS — Z7409 Other reduced mobility: Secondary | ICD-10-CM | POA: Diagnosis not present

## 2023-08-16 DIAGNOSIS — M1711 Unilateral primary osteoarthritis, right knee: Secondary | ICD-10-CM | POA: Diagnosis not present

## 2023-08-16 DIAGNOSIS — Z9181 History of falling: Secondary | ICD-10-CM | POA: Diagnosis not present

## 2023-08-16 DIAGNOSIS — M25561 Pain in right knee: Secondary | ICD-10-CM | POA: Diagnosis not present

## 2023-08-16 DIAGNOSIS — Z7409 Other reduced mobility: Secondary | ICD-10-CM | POA: Diagnosis not present

## 2023-08-16 DIAGNOSIS — G8929 Other chronic pain: Secondary | ICD-10-CM | POA: Diagnosis not present

## 2023-08-16 DIAGNOSIS — Z789 Other specified health status: Secondary | ICD-10-CM | POA: Diagnosis not present

## 2023-08-16 DIAGNOSIS — R6889 Other general symptoms and signs: Secondary | ICD-10-CM | POA: Diagnosis not present

## 2023-08-16 DIAGNOSIS — M25661 Stiffness of right knee, not elsewhere classified: Secondary | ICD-10-CM | POA: Diagnosis not present

## 2023-08-21 ENCOUNTER — Ambulatory Visit: Payer: Self-pay | Admitting: Licensed Clinical Social Worker

## 2023-08-24 NOTE — Patient Instructions (Signed)
 Visit Information  Thank you for taking time to visit with me today. Please don't hesitate to contact me if I can be of assistance to you.   Following are the goals we discussed today:   Goals Addressed             This Visit's Progress    Obtain Supportive Resources-LCSW   On track    Activities and task to complete in order to accomplish goals.   Keep all upcoming appointments discussed today Continue with compliance of taking medication prescribed by Doctor Implement healthy coping skills discussed to assist with management of symptoms F/up with [facility] regarding housing concerns and ability to pay rent Complete preferred housing applications         Our next appointment is by telephone on 5/6 at 3 PM  Please call the care guide team at [phone] if you need to cancel or reschedule your appointment.   If you are experiencing a Mental Health or Behavioral Health Crisis or need someone to talk to, please call the Suicide and Crisis Lifeline: 988 go to [facility] [address], [place] [phone]) call 911   Patient verbalizes understanding of instructions and care plan provided today and agrees to view in MyChart. Active MyChart status and patient understanding of how to access instructions and care plan via MyChart confirmed with patient.     Windy Fast [facility]  [facility], [facility] Clinical Social Worker Direct Dial: [phone]  Fax: [phone] Website: [email or web address] 2:18 PM

## 2023-08-24 NOTE — Patient Outreach (Signed)
 Care Coordination   Follow Up Visit Note   08/21/2023 Name: KAIZLEY AJA MRN: [id number] DOB: [birth year]  NEVAH DALAL is a 48 y.o. year old female who sees Marcine Matar, MD for primary care. I spoke with  [name] by phone today.  What matters to the patients health and wellness today?  Housing and Symptom Managment    Goals Addressed             This Visit's Progress    Obtain Supportive Resources-LCSW   On track    Activities and task to complete in order to accomplish goals.   Keep all upcoming appointments discussed today Continue with compliance of taking medication prescribed by Doctor Implement healthy coping skills discussed to assist with management of symptoms F/up with [facility] regarding housing concerns and ability to pay rent Complete preferred housing applications         SDOH assessments and interventions completed:  No     Care Coordination Interventions:  Yes, provided  internal  Follow up plan: Follow up call scheduled for 2-4 weeks    Encounter Outcome:  Patient Visit Completed   Jenel Lucks, LCSW [place]  [facility], [facility] Clinical Social Worker Direct Dial: [phone]  Fax: [phone] Website: [email or web address] 2:17 PM

## 2023-08-28 ENCOUNTER — Other Ambulatory Visit: Payer: Self-pay | Admitting: Internal Medicine

## 2023-08-28 DIAGNOSIS — I10 Essential (primary) hypertension: Secondary | ICD-10-CM

## 2023-09-06 ENCOUNTER — Ambulatory Visit: Payer: 59 | Attending: Internal Medicine | Admitting: Internal Medicine

## 2023-09-06 VITALS — BP 120/82 | HR 86 | Temp 98.1°F | Ht 61.0 in | Wt 218.0 lb

## 2023-09-06 DIAGNOSIS — Z6841 Body Mass Index (BMI) 40.0 and over, adult: Secondary | ICD-10-CM | POA: Diagnosis not present

## 2023-09-06 DIAGNOSIS — R739 Hyperglycemia, unspecified: Secondary | ICD-10-CM

## 2023-09-06 DIAGNOSIS — I10 Essential (primary) hypertension: Secondary | ICD-10-CM | POA: Diagnosis not present

## 2023-09-06 DIAGNOSIS — E66813 Obesity, class 3: Secondary | ICD-10-CM | POA: Diagnosis not present

## 2023-09-06 DIAGNOSIS — M0579 Rheumatoid arthritis with rheumatoid factor of multiple sites without organ or systems involvement: Secondary | ICD-10-CM

## 2023-09-06 DIAGNOSIS — H9193 Unspecified hearing loss, bilateral: Secondary | ICD-10-CM | POA: Diagnosis not present

## 2023-09-06 LAB — POCT GLYCOSYLATED HEMOGLOBIN (HGB A1C): HbA1c POC (<> result, manual entry): 5.6 % (ref 4.0–5.6)

## 2023-09-06 MED ORDER — WEGOVY 0.25 MG/0.5ML ~~LOC~~ SOAJ: 0.2500 mg | SUBCUTANEOUS | 0 refills | Status: DC

## 2023-09-06 NOTE — Progress Notes (Signed)
 Patient ID: Michele Lang, female    DOB: 1976/03/17  MRN: [id number]  CC: Hearing Problem (Hearing difficulty on L ear & now affecting R ear /Was recommended by different provider to get a ENT referral/Yes to Tdap vax)   Subjective: Michele Lang is a 48 y.o. female who presents for chronic ds management. Her concerns today include:  Patient with history of HTN, RA with positive RA factor, anxiety, anemia   C/o difficulty hearing in both ears x 1 yr. Others tell her they are having to speak louder and she is having to turn TV volume up.  HYPERTENSION Currently taking: see medication list -Norvasc 10 mg daily and hydrochlorothiazide 12.5 mg daily Med Adherence: [x]  Yes    []  No Medication side effects: []  Yes    [x]  No Adherence with salt restriction: [x]  Yes - she has been trying to cut back on salt    []  No Home Monitoring?: []  Yes    [x]  No - device no longer works Monitoring Frequency:  Home BP results range:  SOB? []  Yes    [x]  No but notice some wheezing when she walks a good distance; not sure if due to wgh gain.  No chronic cough or sob when laying down at nights Chest Pain?: []  Yes    [x]  No Leg swelling?: []  Yes    [x]  No Headaches?: []  Yes    [x]  No Dizziness? []  Yes    [x]  No Comments:   Wgh up 16 lbs since I last saw her in 02/2024.  Reports eating habits/portions have remained same. Does snack on crackers, chips, cakes. She has cut back on sodas.  Does drink water and OJ.  Thinks due to prednisone. Currently she is on 10 mg daily for the past 6 mths but has been on Prednisone for over a yr. she is wondering whether getting on Wegovy stopped the weight gain.  I was able to review blood test that she had done last month through her rheumatologist.  Blood sugar level on chemistry was around 120.  Saw her rheumatologist  last mth.  Plaquenil 200 mg daily added to Prednisone and Orencia Q wk to try better control joint pains especially in her hands -PT has  help with strengthening in legs. Goes 2-3x/mth. Ambulates with cane.  No falls  Patient Active Problem List   Diagnosis Date Noted   Class 2 severe obesity due to excess calories with serious comorbidity and body mass index (BMI) of 37.0 to 37.9 in adult (HCC) 09/06/2023   Iron deficiency anemia 01/24/2022   GAD (generalized anxiety disorder) 01/24/2022   Axillary lymphadenopathy 09/06/2020   Rheumatoid arthritis involving multiple sites with positive rheumatoid factor (HCC) 05/12/2015   Chest pain, atypical 11/22/2013   Benign essential hypertension 01/04/2013   Cough 01/04/2013   Pleural effusion 01/03/2013     Current Outpatient Medications on File Prior to Visit  Medication Sig Dispense Refill   acetaminophen (TYLENOL) 650 MG CR tablet Take 650 mg by mouth as needed for pain.     albuterol (VENTOLIN HFA) 108 (90 Base) MCG/ACT inhaler Inhale 1-2 puffs into the lungs every 6 (six) hours as needed. 8 g 0   amLODipine (NORVASC) 10 MG tablet Take 1 tablet (10 mg total) by mouth daily. Please keep upcoming appt for more refills. 30 tablet 0   benzonatate (TESSALON) 100 MG capsule Take 1-2 capsules (100-200 mg total) by mouth 3 (three) times daily as needed. 30 capsule 0  busPIRone (BUSPAR) 5 MG tablet TAKE ONE TABLET BY MOUTH ONCE OR TWICE DAILY (FOR anxiety) 180 tablet 0   EPINEPHrine 0.3 mg/0.3 mL IJ SOAJ injection Inject 0.3 mLs into the muscle as needed.     escitalopram (LEXAPRO) 10 MG tablet Take by mouth.     hydrochlorothiazide (HYDRODIURIL) 12.5 MG tablet Take 1 tablet (12.5 mg total) by mouth daily. 90 tablet 3   hydroxychloroquine (PLAQUENIL) 200 MG tablet Take 200 mg by mouth daily.     ibuprofen (ADVIL) 600 MG tablet Take 1 tablet (600 mg total) by mouth every 8 (eight) hours as needed. 21 tablet 0   ORENCIA CLICKJECT 125 MG/ML SOAJ Inject 125 mg into the skin once a week.     OVER THE COUNTER MEDICATION B 12 injection, one injection per month.     potassium chloride  (KLOR-CON M) 10 MEQ tablet Take 1 tablet (10 mEq total) by mouth daily. 30 tablet 6   No current facility-administered medications on file prior to visit.    Allergies  Allergen Reactions   Infliximab Anaphylaxis   Cozaar [Losartan Potassium] Other (See Comments)    Caused CP   Penicillins Rash    Has patient had a PCN reaction causing immediate rash, facial/tongue/throat swelling, SOB or lightheadedness with hypotension: Yes  Has patient had a PCN reaction causing severe rash involving mucus membranes or skin necrosis: Yes  Has patient had a PCN reaction that required hospitalization No  Has patient had a PCN reaction occurring within the last 10 years: No  If all of the above answers are "NO", then may proceed with Cephalosporin use.  Has patient had a PCN reaction causing immediate rash, facial/tongue/throat swelling, SOB or lightheadedness with hypotension: Yes, Has patient had a PCN reaction causing severe rash involving mucus membranes or skin necrosis: Yes, Has patient had a PCN reaction that required hospitalization No, Has patient had a PCN reaction occurring within the last 10 years: No, If all of the above answers are "NO", then may proceed with Cephalosporin use.    Social History   Socioeconomic History   Marital status: Single    Spouse name: Not on file   Number of children: Not on file   Years of education: Not on file   Highest education level: Some college, no degree  Occupational History   Not on file  Tobacco Use   Smoking status: Never   Smokeless tobacco: Never  Vaping Use   Vaping status: Never Used  Substance and Sexual Activity   Alcohol use: No   Drug use: No   Sexual activity: Never  Other Topics Concern   Not on file  Social History Narrative   Not on file   Social Drivers of Health   Financial Resource Strain: High Risk (09/06/2023)   Overall Financial Resource Strain (CARDIA)    Difficulty of Paying Living Expenses: Hard  Food  Insecurity: Food Insecurity Present (09/06/2023)   Hunger Vital Sign    Worried About Running Out of Food in the Last Year: Never true    Ran Out of Food in the Last Year: Sometimes true  Transportation Needs: Unmet Transportation Needs (09/06/2023)   PRAPARE - Transportation    Lack of Transportation (Medical): Yes    Lack of Transportation (Non-Medical): Yes  Physical Activity: Inactive (09/06/2023)   Exercise Vital Sign    Days of Exercise per Week: 0 days    Minutes of Exercise per Session: 0 min  Stress: No Stress Concern Present (  09/06/2023)   [employer] [employer] - Occupational Stress Questionnaire    Feeling of Stress : Only a little  Recent Concern: Stress - Stress Concern Present (07/03/2023)   [employer] of Occupational Health - Occupational Stress Questionnaire    Feeling of Stress : Rather much  Social Connections: Socially Isolated (09/06/2023)   Social Connection and Isolation Panel [NHANES]    Frequency of Communication with Friends and Family: Once a week    Frequency of Social Gatherings with Friends and Family: Once a week    Attends Religious Services: Never    Database administrator or Organizations: No    Attends Banker Meetings: Never    Marital Status: Never married  Intimate Partner Violence: Not At Risk (07/03/2023)   Humiliation, Afraid, Rape, and Kick questionnaire    Fear of Current or Ex-Partner: No    Emotionally Abused: No    Physically Abused: No    Sexually Abused: No    Family History  Problem Relation Age of Onset   Hypertension Mother    Heart disease Mother    Hypertension Father    Hypertension Sister    Depression Maternal Uncle    Depression Maternal Grandmother    Rheum arthritis Maternal Grandmother    Asthma Son    Allergies Son    Colon cancer Neg Hx    Esophageal cancer Neg Hx    Rectal cancer Neg Hx    Stomach cancer Neg Hx     Past Surgical History:  Procedure Laterality Date    BREAST SURGERY     FOOT SURGERY      ROS: Review of Systems Negative except as stated above  PHYSICAL EXAM: BP 120/82 (BP Location: Left Arm, Patient Position: Sitting, Cuff Size: Large)   Pulse 86   Temp 98.1 F (36.7 C) (Oral)   Ht 5\' 1"  (1.549 m)   Wt 218 lb (98.9 kg)   SpO2 95%   BMI 41.19 kg/m   Wt Readings from Last 3 Encounters:  09/06/23 218 lb (98.9 kg)  07/03/23 205 lb (93 kg)  06/21/23 211 lb 9.6 oz (96 kg)    Physical Exam  General appearance - alert, well appearing, middle-age female and in no distress.  She has noted weight gain since last visit. Mental status - normal mood, behavior, speech, dress, motor activity, and thought processes Neck - supple, no significant adenopathy Ears: Both ear canals and tympanic membranes are within normal limits Chest - clear to auscultation, no wheezes, rales or rhonchi, symmetric air entry Heart - normal rate, regular rhythm, normal S1, S2, no murmurs, rubs, clicks or gallops Musculoskeletal -she has deformity of joints in both hands.  She ambulates with a cane. Extremities -no lower extremity edema.      Latest Ref Rng & Units 02/06/2023    3:59 PM 08/15/2020    1:09 PM 08/01/2018    5:20 PM  CMP  Glucose 70 - 99 mg/dL 621  85  308   BUN 6 - 20 mg/dL 14  8  15    Creatinine 0.44 - 1.00 mg/dL 6.57  8.46  9.62   Sodium 135 - 145 mmol/L 140  133  134   Potassium 3.5 - 5.1 mmol/L 3.6  3.7  3.8   Chloride 98 - 111 mmol/L 101  102  102   CO2 22 - 32 mmol/L 24  24  25    Calcium 8.9 - 10.3 mg/dL 9.0  8.5  8.9  Total Protein 6.5 - 8.1 g/dL   7.8   Total Bilirubin 0.3 - 1.2 mg/dL   0.5   Alkaline Phos 38 - 126 U/L   58   AST 15 - 41 U/L   17   ALT 0 - 44 U/L   13    Lipid Panel  No results found for: "CHOL", "TRIG", "HDL", "CHOLHDL", "VLDL", "LDLCALC", "LDLDIRECT"  CBC    Component Value Date/Time   WBC 6.1 02/06/2023 1559   RBC 4.85 02/06/2023 1559   HGB 11.3 (L) 02/06/2023 1559   HGB 9.9 (L) 01/24/2022 1504    HCT 37.8 02/06/2023 1559   HCT 33.7 (L) 01/24/2022 1504   PLT 289 02/06/2023 1559   PLT 332 01/24/2022 1504   MCV 77.9 (L) 02/06/2023 1559   MCV 73 (L) 01/24/2022 1504   MCH 23.3 (L) 02/06/2023 1559   MCHC 29.9 (L) 02/06/2023 1559   RDW 15.5 02/06/2023 1559   RDW 19.1 (H) 01/24/2022 1504   LYMPHSABS 1.7 07/25/2018 1921   MONOABS 0.0 (L) 07/25/2018 1921   EOSABS 0.2 07/25/2018 1921   BASOSABS 0.1 07/25/2018 1921    ASSESSMENT AND PLAN: 1. Essential hypertension (Primary) At goal.  Continue Norvasc 10 mg daily and HCTZ 12.5 mg daily.  2. Class 3 severe obesity due to excess calories with serious comorbidity and body mass index (BMI) of 40.0 to 44.9 in adult [facility]) Prednisone definitely playing a role with her weight gain. Patient advised to eliminate sugary drinks from the diet, cut back on portion sizes especially of white carbohydrates, eat more white lean meat like chicken [place] and seafood instead of beef or pork and incorporate fresh fruits and vegetables into the diet daily. -She is wanting to try Wegovy to see if it would help in terms of decreasing appetite and hopefully bringing about some weight loss.  Advised that she also speaks with her rheumatologist and whether prednisone can be tapered further now that Plaquenil has been added. -Went over with her how Frederik Jansky works and potential side effects.  Advised that the medication can cause nausea/vomiting, pancreatitis, bowel blockage, severe diarrhea/constipation.  She is still willing to try the medication. -Advised that we will start at the lowest dose of 0.25 mg once a week.  After being on this dose for 1 month, if she is tolerating it, advised that she send me a MyChart message to let me know so that we can increase to the 0.5 mg. -Advised to stop the medicine and come in if she develops any recurrent vomiting, abdominal pain, severe diarrhea/constipation. -Advised to have the pharmacist show her how to administer the once weekly  injection once prescription is filled.  Message sent to our pharmacy tech to get prior approval from her insurance. - Semaglutide-Weight Management (WEGOVY) 0.25 MG/0.5ML SOAJ; Inject 0.25 mg into the skin once a week.  Dispense: 2 mL; Refill: 0  3. Hyperglycemia A1c today was normal at 5.6. - POCT glycosylated hemoglobin (Hb A1C)  4. Rheumatoid arthritis involving multiple sites with positive rheumatoid factor (HCC) Followed by rheumatology at [facility].  Currently on Orencia, prednisone 10 mg daily and Plaquenil 200 mg daily.  5. Decreased hearing of both ears - Ambulatory referral to ENT    Patient was given the opportunity to ask questions.  Patient verbalized understanding of the plan and was able to repeat key elements of the plan.   This documentation was completed using Paediatric nurse.  Any transcriptional errors are unintentional.  Orders Placed This Encounter  Procedures   Ambulatory referral to ENT   POCT glycosylated hemoglobin (Hb A1C)     Requested Prescriptions   Signed Prescriptions Disp Refills   Semaglutide-Weight Management (WEGOVY) 0.25 MG/0.5ML SOAJ 2 mL 0    Sig: Inject 0.25 mg into the skin once a week.    Return in about 4 months (around 01/06/2024).  Concetta Dee, MD, FACP

## 2023-09-06 NOTE — Patient Instructions (Addendum)
 Take Calcium 600 mg twice a day and Vitamin D 400 international units once a day.  Both can be purchased over the counter.      I have sent a prescription to your pharmacy for the medication Wegovy.  We will start you on the 0.25 mg once a week.  After you have been on this dose for 1 month, if you are tolerating it, please let me know via MyChart so that we can increase to the next dose up of the 0.5 mg.  Please let me know if your insurance approves it as I will have you do a video visit with me after you have been on it for about 6 weeks. -The medication can cause nausea/vomiting, pancreatitis, bowel blockage, severe diarrhea/constipation.  Once you start the medication, if you develop any vomiting, abdominal pain, severe diarrhea or constipation, please stop the medicine and come on in to be seen.

## 2023-09-07 ENCOUNTER — Telehealth: Payer: Self-pay

## 2023-09-07 ENCOUNTER — Other Ambulatory Visit: Payer: Self-pay

## 2023-09-07 NOTE — Telephone Encounter (Signed)
 Pharmacy Patient Advocate Encounter   Received notification from Physician's Office that prior authorization for WEGOVY  is required/requested.   Insurance verification completed.   The patient is insured through [employer] .   Per test claim: PA required; PA submitted to above mentioned insurance via CoverMyMeds Key/confirmation #/EOC [id number] Status is pending

## 2023-09-07 NOTE — Progress Notes (Signed)
 Michele Lang spoke with patient 4/4 and has a follow up 5/6. Just wanted to see if you still need to reschedule your missed call?

## 2023-09-10 ENCOUNTER — Other Ambulatory Visit: Payer: Self-pay

## 2023-09-10 DIAGNOSIS — M1711 Unilateral primary osteoarthritis, right knee: Secondary | ICD-10-CM | POA: Diagnosis not present

## 2023-09-10 DIAGNOSIS — Z9181 History of falling: Secondary | ICD-10-CM | POA: Diagnosis not present

## 2023-09-10 DIAGNOSIS — Z789 Other specified health status: Secondary | ICD-10-CM | POA: Diagnosis not present

## 2023-09-10 DIAGNOSIS — G8929 Other chronic pain: Secondary | ICD-10-CM | POA: Diagnosis not present

## 2023-09-10 DIAGNOSIS — Z7409 Other reduced mobility: Secondary | ICD-10-CM | POA: Diagnosis not present

## 2023-09-10 DIAGNOSIS — M25561 Pain in right knee: Secondary | ICD-10-CM | POA: Diagnosis not present

## 2023-09-10 DIAGNOSIS — M25661 Stiffness of right knee, not elsewhere classified: Secondary | ICD-10-CM | POA: Diagnosis not present

## 2023-09-24 ENCOUNTER — Telehealth (HOSPITAL_BASED_OUTPATIENT_CLINIC_OR_DEPARTMENT_OTHER): Admitting: Internal Medicine

## 2023-09-24 DIAGNOSIS — E66813 Obesity, class 3: Secondary | ICD-10-CM

## 2023-09-24 DIAGNOSIS — Z6841 Body Mass Index (BMI) 40.0 and over, adult: Secondary | ICD-10-CM | POA: Diagnosis not present

## 2023-09-24 MED ORDER — TIRZEPATIDE-WEIGHT MANAGEMENT 2.5 MG/0.5ML ~~LOC~~ SOLN: 2.5000 mg | SUBCUTANEOUS | 0 refills | Status: DC

## 2023-09-24 NOTE — Progress Notes (Signed)
 Patient ID: Michele Lang, female   DOB: [birth year], 48 y.o.   MRN: [id number] Virtual Visit via Video Note  I connected with Michele Lang on 09/24/2023 at 8:12 AM by a video enabled telemedicine application and verified that I am speaking with the correct person using two identifiers.  Location: Patient: home Provider: Office   I discussed the limitations of evaluation and management by telemedicine and the availability of in person appointments. The patient expressed understanding and agreed to proceed.  History of Present Illness: Patient with history of HTN, RA with positive RA factor, anxiety, anemia  Discussed the use of AI scribe software for clinical note transcription with the patient, who gave verbal consent to proceed.  History of Present Illness   Michele Lang is a 48 year old female who presents for evaluation of alternative medications for weight loss.  Prescribe Wegovy  on last visit with me on April 17 to help with weight loss.  Patient is morbidly obese and is on prednisone  for her rheumatologic condition.  She is unable to use Wegovy  for weight loss due to lack of insurance coverage.  She states that she went online for her insurance and was told that Ozempic may be covered.           Outpatient Encounter Medications as of 09/24/2023  Medication Sig   acetaminophen  (TYLENOL ) 650 MG CR tablet Take 650 mg by mouth as needed for pain.   albuterol  (VENTOLIN  HFA) 108 (90 Base) MCG/ACT inhaler Inhale 1-2 puffs into the lungs every 6 (six) hours as needed.   amLODipine  (NORVASC ) 10 MG tablet Take 1 tablet (10 mg total) by mouth daily. Please keep upcoming appt for more refills.   benzonatate  (TESSALON ) 100 MG capsule Take 1-2 capsules (100-200 mg total) by mouth 3 (three) times daily as needed.   busPIRone  (BUSPAR ) 5 MG tablet TAKE ONE TABLET BY MOUTH ONCE OR TWICE DAILY (FOR anxiety)   EPINEPHrine  0.3 mg/0.3 mL IJ SOAJ injection Inject 0.3 mLs into the  muscle as needed.   escitalopram (LEXAPRO) 10 MG tablet Take by mouth.   hydrochlorothiazide  (HYDRODIURIL ) 12.5 MG tablet Take 1 tablet (12.5 mg total) by mouth daily.   hydroxychloroquine (PLAQUENIL) 200 MG tablet Take 200 mg by mouth daily.   ibuprofen  (ADVIL ) 600 MG tablet Take 1 tablet (600 mg total) by mouth every 8 (eight) hours as needed.   ORENCIA CLICKJECT 125 MG/ML SOAJ Inject 125 mg into the skin once a week.   OVER THE COUNTER MEDICATION B 12 injection, one injection per month.   potassium chloride  (KLOR-CON  M) 10 MEQ tablet Take 1 tablet (10 mEq total) by mouth daily.   Semaglutide -Weight Management (WEGOVY ) 0.25 MG/0.5ML SOAJ Inject 0.25 mg into the skin once a week.   No facility-administered encounter medications on file as of 09/24/2023.      Observations/Objective: Middle age AAF laying down and in NAD Last wgh 218, BMI 41.19, height 5'1"  Assessment and Plan: 1. Class 3 severe obesity due to excess calories with serious comorbidity and body mass index (BMI) of 40.0 to 44.9 in adult (Primary) Explored alternative medications due to insurance coverage issues. Considered Zepbound as an alternative to Wegovy . Phentermine considered if Zepbound is not approved. - Will Attempt prior authorization for Zepbound. Went over with her potential side effects of the medication including nausea, vomiting, diarrhea/constipation, pancreatitis and bowel blockage - If approved, initiate Zepbound at 2.5 mg weekly. Monitor for nausea, vomiting, abdominal pain, diarrhea, constipation.if these occur,  she should stop the medicine and contact me immediately.  - After one month on Zepbound, send MyChart message to discuss tolerance and potential dose increase. - If Zepbound not approved, consider phentermine. Discuss side effects and cost.  Phentermine may increase blood pressure a little and can also cause palpitations.  Would need to monitor blood pressure at least twice a week for the first  several weeks of being on the medicine.     Follow Up Instructions: As previously schedule   I discussed the assessment and treatment plan with the patient. The patient was provided an opportunity to ask questions and all were answered. The patient agreed with the plan and demonstrated an understanding of the instructions.   The patient was advised to call back or seek an in-person evaluation if the symptoms worsen or if the condition fails to improve as anticipated.  I spent 10 minutes dedicated to the care of this patient on the date of this encounter to include previsit review of of my last note, face to face time with pt discussing dx and management and post visit entering of orders.  This note has been created with Education officer, environmental. Any transcriptional errors are unintentional.  Concetta Dee, MD

## 2023-09-25 ENCOUNTER — Ambulatory Visit: Payer: Self-pay | Admitting: Licensed Clinical Social Worker

## 2023-09-27 NOTE — Patient Outreach (Signed)
 Complex Care Management   Visit Note  09/25/2023  Name:  Michele Lang MRN: [id number] DOB: April 10, 1976  Situation: Referral received for Complex Care Management related to Menta/Behavioral Health diagnosis Anxiety I obtained verbal consent from Patient.  Visit completed with pt  on the phone  Background:   Past Medical History:  Diagnosis Date   Anemia    Anxiety    Arthritis    B12 deficiency 03/20/2022   Depression    Drug therapy 11/08/2021   GERD (gastroesophageal reflux disease)    Heart murmur    High risk medication use 05/12/2015   Hypertension    Iron deficiency 03/16/2022   Non-compliance 08/29/2016   Sleep apnea     Assessment: Patient Reported Symptoms:  Cognitive Cognitive Status: Alert and oriented to person, place, and time, Normal speech and language skills      Neurological Neurological Review of Symptoms: No symptoms reported    HEENT HEENT Symptoms Reported: No symptoms reported      Cardiovascular Cardiovascular Symptoms Reported: No symptoms reported    Respiratory Respiratory Symptoms Reported: No symptoms reported    Endocrine Patient reports the following symptoms related to hypoglycemia or hyperglycemia : No symptoms reported    Gastrointestinal Gastrointestinal Symptoms Reported: No symptoms reported      Genitourinary Genitourinary Symptoms Reported: No symptoms reported    Integumentary Integumentary Symptoms Reported: No symptoms reported    Musculoskeletal Musculoskelatal Symptoms Reviewed: No symptoms reported Musculoskeletal Conditions: Rheumatoid arthritis Falls in the past year?: No Number of falls in past year: 1 or less Was there an injury with Fall?: No Fall Risk Category Calculator: 0 Patient Fall Risk Level: Low Fall Risk    Psychosocial Psychosocial Symptoms Reported: No symptoms reported Behavioral Health Conditions: Anxiety Behavioral Management Strategies: Adequate rest, Support system, Coping  strategies Major Change/Loss/Stressor/Fears (CP): Environment, Medical condition, self Quality of Family Relationships: involved Do you feel physically threatened by others?: No      09/06/2023    2:37 PM  Depression screen PHQ 2/9  Decreased Interest 1  Down, Depressed, Hopeless 1  PHQ - 2 Score 2  Altered sleeping 0  Tired, decreased energy 1  Change in appetite 0  Feeling bad or failure about yourself  0  Trouble concentrating 1  Moving slowly or fidgety/restless 0  Suicidal thoughts 0  PHQ-9 Score 4  Difficult doing work/chores Somewhat difficult    There were no vitals filed for this visit.  Medications Reviewed Today     Reviewed by Adriana Albany, LCSW (Social Worker) on 09/25/23 at 1535  Med List Status: <None>   Medication Order Taking? Sig Documenting Provider Last Dose Status Informant  acetaminophen  (TYLENOL ) 650 MG CR tablet [id number] No Take 650 mg by mouth as needed for pain.  Patient not taking: Reported on 09/25/2023   [provider] Not Taking Active   albuterol  (VENTOLIN  HFA) 108 (90 Base) MCG/ACT inhaler [id number] No Inhale 1-2 puffs into the lungs every 6 (six) hours as needed.  Patient not taking: Reported on 09/25/2023   Angelia Kelp, PA-C Not Taking Active   amLODipine  (NORVASC ) 10 MG tablet [id number] Yes Take 1 tablet (10 mg total) by mouth daily. Please keep upcoming appt for more refills. Lawrance Presume, MD Taking Active   benzonatate  (TESSALON ) 100 MG capsule [id number] No Take 1-2 capsules (100-200 mg total) by mouth 3 (three) times daily as needed.  Patient not taking: Reported on 09/25/2023   Angelia Kelp, PA-C Not Taking  Active   busPIRone  (BUSPAR ) 5 MG tablet [id number] No TAKE ONE TABLET BY MOUTH ONCE OR TWICE DAILY (FOR anxiety)  Patient not taking: Reported on 09/25/2023   Lawrance Presume, MD Not Taking Active   EPINEPHrine  0.3 mg/0.3 mL IJ SOAJ injection [id number] No Inject 0.3 mLs into the muscle as needed.   Patient not taking: Reported on 09/25/2023   [provider] Not Taking Active   escitalopram (LEXAPRO) 10 MG tablet [id number] No Take by mouth.  Patient not taking: Reported on 09/25/2023   [provider] Not Taking Active   hydrochlorothiazide  (HYDRODIURIL ) 12.5 MG tablet [id number] Yes Take 1 tablet (12.5 mg total) by mouth daily. Lawrance Presume, MD Taking Active   hydroxychloroquine (PLAQUENIL) 200 MG tablet [id number] Yes Take 200 mg by mouth daily. [provider] Taking Active   ibuprofen  (ADVIL ) 600 MG tablet [id number] No Take 1 tablet (600 mg total) by mouth every 8 (eight) hours as needed.  Patient not taking: Reported on 09/25/2023   Budd Cargo, PA-C Not Taking Active   ORENCIA CLICKJECT 125 MG/ML SOAJ [id number] Yes Inject 125 mg into the skin once a week. [provider] Taking Active   OVER THE COUNTER MEDICATION [id number] Yes B 12 injection, one injection per month. [provider] Taking Active   potassium chloride  (KLOR-CON  M) 10 MEQ tablet [id number] No Take 1 tablet (10 mEq total) by mouth daily.  Patient not taking: Reported on 09/25/2023   Lawrance Presume, MD Not Taking Active   tirzepatide (ZEPBOUND) 2.5 MG/0.5ML injection vial [id number]  Inject 2.5 mg into the skin once a week. Lawrance Presume, MD  Active            Med Note Harles Lied, Jennings Mohr D   Tue Sep 25, 2023  3:35 PM) New prescription, pt hasn't filled yet             Recommendation:   Continue utilizing strategies discussed to assist with symptom management  Follow Up Plan:   Telephone follow up appointment date/time:  4-6 weeks  Alease Hunter, LCSW [facility]  [facility], [facility] Clinical Social Worker Direct Dial: [phone]  Fax: [phone] Website: [email or web address] 3:23 PM

## 2023-09-27 NOTE — Patient Instructions (Signed)
 Visit Information  Thank you for taking time to visit with me today. Please don't hesitate to contact me if I can be of assistance to you before our next scheduled appointment.  Our next appointment is by telephone on 06/04 at 1 PM Please call the care guide team at [phone] if you need to cancel or reschedule your appointment.   Following is a copy of your care plan:   Goals Addressed             This Visit's Progress    LCSW VBCI Social Work Care Plan   On track    Problems:   Disease Management support and education needs related to Anxiety with Excessive Worry,  CSW Clinical Goal(s):   Over the next 90 days the Patient will demonstrate a reduction in symptoms related to Anxiety with Excessive Worry, .  Interventions:  Mental Health:  Evaluation of current treatment plan related to Anxiety with Excessive Worry, Active listening / Reflection utilized Emotional Support Provided Mindfulness or Relaxation training provided Provided general psycho-education for mental health needs Solution-Focued Strategies employed:  Patient Goals/Self-Care Activities:  Increase coping skills and healthy habits  F/up with [facility] regarding housing concerns and ability to pay rent Complete preferred housing applications  Plan:   Telephone follow up appointment with care management team member scheduled for:  4-6 weeks     COMPLETED: Obtain Supportive Resources-LCSW       Activities and task to complete in order to accomplish goals.   Keep all upcoming appointments discussed today Continue with compliance of taking medication prescribed by Doctor Implement healthy coping skills discussed to assist with management of symptoms F/up with [facility] regarding housing concerns and ability to pay rent Complete preferred housing applications         Please call the Suicide and Crisis Lifeline: 988 go to [facility] [address], [place]  [phone]) call 911 if you are experiencing a Mental Health or Behavioral Health Crisis or need someone to talk to.  Patient verbalizes understanding of instructions and care plan provided today and agrees to view in MyChart. Active MyChart status and patient understanding of how to access instructions and care plan via MyChart confirmed with patient.     Arlis Bent [facility]  [facility], [facility] Clinical Social Worker Direct Dial: [phone]  Fax: [phone] Website: [email or web address] 3:24 PM

## 2023-10-02 ENCOUNTER — Other Ambulatory Visit: Payer: Self-pay

## 2023-10-03 DIAGNOSIS — Z9181 History of falling: Secondary | ICD-10-CM | POA: Diagnosis not present

## 2023-10-03 DIAGNOSIS — M25661 Stiffness of right knee, not elsewhere classified: Secondary | ICD-10-CM | POA: Diagnosis not present

## 2023-10-03 DIAGNOSIS — M25561 Pain in right knee: Secondary | ICD-10-CM | POA: Diagnosis not present

## 2023-10-03 DIAGNOSIS — G8929 Other chronic pain: Secondary | ICD-10-CM | POA: Diagnosis not present

## 2023-10-03 DIAGNOSIS — Z7409 Other reduced mobility: Secondary | ICD-10-CM | POA: Diagnosis not present

## 2023-10-03 DIAGNOSIS — Z789 Other specified health status: Secondary | ICD-10-CM | POA: Diagnosis not present

## 2023-10-03 DIAGNOSIS — M1711 Unilateral primary osteoarthritis, right knee: Secondary | ICD-10-CM | POA: Diagnosis not present

## 2023-10-10 ENCOUNTER — Telehealth: Payer: Self-pay | Admitting: Internal Medicine

## 2023-10-10 DIAGNOSIS — M25661 Stiffness of right knee, not elsewhere classified: Secondary | ICD-10-CM | POA: Diagnosis not present

## 2023-10-10 DIAGNOSIS — M25561 Pain in right knee: Secondary | ICD-10-CM | POA: Diagnosis not present

## 2023-10-10 DIAGNOSIS — Z9181 History of falling: Secondary | ICD-10-CM | POA: Diagnosis not present

## 2023-10-10 DIAGNOSIS — M1711 Unilateral primary osteoarthritis, right knee: Secondary | ICD-10-CM | POA: Diagnosis not present

## 2023-10-10 DIAGNOSIS — Z7409 Other reduced mobility: Secondary | ICD-10-CM | POA: Diagnosis not present

## 2023-10-10 NOTE — Telephone Encounter (Signed)
 Copied from CRM [id number]. Topic: Clinical - Request for Lab/Test Order >> Oct 10, 2023  2:18 PM [name] wrote:  Reason for CRM: Pt wants to have an Xray of her right knee. Says this is from her ortho specialist Dr. Mar Semen. Please advise.

## 2023-10-10 NOTE — Telephone Encounter (Signed)
 This should be ordered by the specialist so results are sent directly to them.

## 2023-10-10 NOTE — Telephone Encounter (Signed)
 Called & spoke to the patient. Verified name & DOB. Informed patient "This should be ordered by the specialist so results are sent directly to them." Patient expressed verbal understanding.

## 2023-10-24 ENCOUNTER — Other Ambulatory Visit: Payer: Self-pay | Admitting: Licensed Clinical Social Worker

## 2023-10-24 NOTE — Patient Instructions (Signed)
 Visit Information  Thank you for taking time to visit with me today. Please don't hesitate to contact me if I can be of assistance to you before our next scheduled appointment.  Your next care management appointment is by telephone on 07/02 at 1 PM  Please call the care guide team at [phone] if you need to cancel, schedule, or reschedule an appointment.   Please call the Suicide and Crisis Lifeline: 988 go to [facility]gent [facility] [address] [place] [phone]) call 911 if you are experiencing a Mental Health or Behavioral Health Crisis or need someone to talk to.  Alease Hunter, LCSW [place]  [facility], [facility] Clinical Social Worker Direct Dial: [phone]  Fax: [phone] Website: [email or web address] 4:40 PM

## 2023-10-24 NOTE — Patient Outreach (Signed)
 Complex Care Management   Visit Note  10/24/2023  Name:  Michele Lang MRN: [id number] DOB: June 25, 1975  Situation: Referral received for Complex Care Management related to Mental/Behavioral Health diagnosis Anxiety I obtained verbal consent from Patient.  Visit completed with pt  on the phone  Background:   Past Medical History:  Diagnosis Date   Anemia    Anxiety    Arthritis    B12 deficiency 03/20/2022   Depression    Drug therapy 11/08/2021   GERD (gastroesophageal reflux disease)    Heart murmur    High risk medication use 05/12/2015   Hypertension    Iron deficiency 03/16/2022   Non-compliance 08/29/2016   Sleep apnea     Assessment: Patient Reported Symptoms:  Cognitive Cognitive Status: Alert and oriented to person, place, and time, Normal speech and language skills Cognitive/Intellectual Conditions Management [RPT]: None reported or documented in medical history or problem list   Health Maintenance Behaviors: Stress management  Neurological Neurological Review of Symptoms: No symptoms reported    HEENT HEENT Symptoms Reported: Not assessed      Cardiovascular Cardiovascular Symptoms Reported: No symptoms reported    Respiratory Respiratory Symptoms Reported: No symptoms reported    Endocrine Patient reports the following symptoms related to hypoglycemia or hyperglycemia : No symptoms reported    Gastrointestinal Gastrointestinal Symptoms Reported: Not assessed      Genitourinary Genitourinary Symptoms Reported: Not assessed    Integumentary Integumentary Symptoms Reported: Not assessed    Musculoskeletal Musculoskelatal Symptoms Reviewed: Not assessed        Psychosocial Psychosocial Symptoms Reported: Other Other Psychosocial Conditions: Stress Additional Psychological Details: Patient continues to utilize strategies to assist with stress management. She has recently attended court hearing regarding unpaid balance to [facility] and was encouraged to  file an appeal to obtain an extension to assist with securing new housing Behavioral Management Strategies: Support system, Coping strategies Major Change/Loss/Stressor/Fears (CP): Medical condition, self, Environment Techniques to [employer] with Loss/Stress/Change: Diversional activities Quality of Family Relationships: involved      09/06/2023    2:37 PM  Depression screen PHQ 2/9  Decreased Interest 1  Down, Depressed, Hopeless 1  PHQ - 2 Score 2  Altered sleeping 0  Tired, decreased energy 1  Change in appetite 0  Feeling bad or failure about yourself  0  Trouble concentrating 1  Moving slowly or fidgety/restless 0  Suicidal thoughts 0  PHQ-9 Score 4  Difficult doing work/chores Somewhat difficult    There were no vitals filed for this visit.  Medications Reviewed Today     Reviewed by Brielynn Sekula D, LCSW (Social Worker) on 10/24/23 at 1628  Med List Status: <None>   Medication Order Taking? Sig Documenting Provider Last Dose Status Informant  acetaminophen  (TYLENOL ) 650 MG CR tablet [id number] No Take 650 mg by mouth as needed for pain.  Patient not taking: Reported on 09/25/2023   [provider] Not Taking Active   albuterol  (VENTOLIN  HFA) 108 (90 Base) MCG/ACT inhaler [id number] No Inhale 1-2 puffs into the lungs every 6 (six) hours as needed.  Patient not taking: Reported on 09/25/2023   Angelia Kelp, PA-C Not Taking Active   amLODipine  (NORVASC ) 10 MG tablet [id number] No Take 1 tablet (10 mg total) by mouth daily. Please keep upcoming appt for more refills. Lawrance Presume, MD Taking Active   benzonatate  (TESSALON ) 100 MG capsule [id number] No Take 1-2 capsules (100-200 mg total) by mouth 3 (three) times daily as needed.  Patient not taking: Reported on 09/25/2023   Angelia Kelp, PA-C Not Taking Active   busPIRone  (BUSPAR ) 5 MG tablet [id number] No TAKE ONE TABLET BY MOUTH ONCE OR TWICE DAILY (FOR anxiety)  Patient not taking: Reported on 09/25/2023    Lawrance Presume, MD Not Taking Active   EPINEPHrine  0.3 mg/0.3 mL IJ SOAJ injection [id number] No Inject 0.3 mLs into the muscle as needed.  Patient not taking: Reported on 09/25/2023   [provider] Not Taking Active   escitalopram (LEXAPRO) 10 MG tablet [id number] No Take by mouth.  Patient not taking: Reported on 09/25/2023   [provider] Not Taking Active   hydrochlorothiazide  (HYDRODIURIL ) 12.5 MG tablet [id number] No Take 1 tablet (12.5 mg total) by mouth daily. Lawrance Presume, MD Taking Active   hydroxychloroquine (PLAQUENIL) 200 MG tablet [id number] No Take 200 mg by mouth daily. [provider] Taking Active   ibuprofen  (ADVIL ) 600 MG tablet [id number] No Take 1 tablet (600 mg total) by mouth every 8 (eight) hours as needed.  Patient not taking: Reported on 09/25/2023   Budd Cargo, PA-C Not Taking Active   ORENCIA CLICKJECT 125 MG/ML SOAJ [id number] No Inject 125 mg into the skin once a week. [provider] Taking Active   OVER THE COUNTER MEDICATION [id number] No B 12 injection, one injection per month. [provider] Taking Active   potassium chloride  (KLOR-CON  M) 10 MEQ tablet [id number] No Take 1 tablet (10 mEq total) by mouth daily.  Patient not taking: Reported on 09/25/2023   Lawrance Presume, MD Not Taking Active   tirzepatide (ZEPBOUND) 2.5 MG/0.5ML injection vial [id number]  Inject 2.5 mg into the skin once a week. Lawrance Presume, MD  Active            Med Note Harles Lied, Jennings Mohr D   Tue Sep 25, 2023  3:35 PM) New prescription, pt hasn't filled yet             Recommendation:   PCP Follow-up Continue Current Plan of Care  Follow Up Plan:   Telephone follow-up in 1 month  Alease Hunter, LCSW [facility]  [facility], [facility] Clinical Social Worker Direct Dial: [phone]  Fax: [phone] Website: [email or web address] 4:36 PM

## 2023-10-25 DIAGNOSIS — G4489 Other headache syndrome: Secondary | ICD-10-CM | POA: Diagnosis not present

## 2023-10-30 ENCOUNTER — Other Ambulatory Visit: Payer: Self-pay | Admitting: Internal Medicine

## 2023-10-30 DIAGNOSIS — I10 Essential (primary) hypertension: Secondary | ICD-10-CM

## 2023-11-19 ENCOUNTER — Ambulatory Visit (INDEPENDENT_AMBULATORY_CARE_PROVIDER_SITE_OTHER): Admitting: Otolaryngology

## 2023-11-19 ENCOUNTER — Ambulatory Visit (INDEPENDENT_AMBULATORY_CARE_PROVIDER_SITE_OTHER): Admitting: Audiology

## 2023-11-19 ENCOUNTER — Encounter (INDEPENDENT_AMBULATORY_CARE_PROVIDER_SITE_OTHER): Payer: Self-pay | Admitting: Otolaryngology

## 2023-11-19 VITALS — BP 144/81 | HR 106 | Ht 62.0 in

## 2023-11-19 DIAGNOSIS — H900 Conductive hearing loss, bilateral: Secondary | ICD-10-CM | POA: Diagnosis not present

## 2023-11-19 DIAGNOSIS — H6993 Unspecified Eustachian tube disorder, bilateral: Secondary | ICD-10-CM | POA: Diagnosis not present

## 2023-11-19 MED ORDER — FLUTICASONE PROPIONATE 50 MCG/ACT NA SUSP
2.0000 | Freq: Two times a day (BID) | NASAL | 6 refills | Status: AC
Start: 2023-11-19 — End: ?

## 2023-11-19 MED ORDER — AZELASTINE HCL 0.1 % NA SOLN
2.0000 | Freq: Two times a day (BID) | NASAL | 12 refills | Status: AC
Start: 2023-11-19 — End: ?

## 2023-11-19 NOTE — Progress Notes (Signed)
  [address] [place], [place] [address] [phone]  Audiological Evaluation    Name: Michele Lang     DOB:   04-Oct-1975      MRN:   [id number]                                                                                     Service Date: 11/19/2023     Accompanied by: unaccompanied   Patient comes today after Dr. Tobie, ENT sent a referral for a hearing evaluation due to concerns with hearing loss.   Symptoms Yes Details  Hearing loss  []  Both ears  Tinnitus  []    Ear pain/ infections/pressure  []    Balance problems  []    Noise exposure history  []    Previous ear surgeries  []    Family history of hearing loss  []    Amplification  []    Other  [x]  Reports was tested at [facility]    Otoscopy: Right ear: Clear external ear canal and notable landmarks visualized on the tympanic membrane. Left ear:  Clear external ear canal and notable landmarks visualized on the tympanic membrane.  Tympanometry: Right ear: Type B- Normal external ear canal volume with no middle ear pressure peak or tympanic membrane compliance. Left ear: Type B- Normal external ear canal volume with no middle ear pressure peak or tympanic membrane compliance.  Pure tone Audiometry: Right ear- Normal to moderate conductive hearing loss from 250 Hz - 8000 Hz. Left ear-  Normal to mild conductive hearing loss from 250 Hz - 8000 Hz.  Speech Audiometry: Right ear- Speech Reception Threshold (SRT) was obtained at 35 dBHL. Left ear-Speech Reception Threshold (SRT) was obtained at 40 dBHL.   Word Recognition Score Tested using NU-6 (recorded) Right ear: 100% was obtained at a presentation level of 70 dBHL with contralateral masking which is deemed as  excellent. Left ear: 100% was obtained at a presentation level of 70 dBHL with contralateral masking which is deemed as  excellent.   The hearing test results were completed under headphones and results are deemed to be of  good reliability. Test technique:  conventional    Recommendations: Follow up with ENT as scheduled for today., Repeat audiogram after medical care.   Camilia Caywood MARIE LEROUX-MARTINEZ, AUD

## 2023-11-19 NOTE — Progress Notes (Signed)
 Dear Dr. Vicci, Here is my assessment for our mutual patient, Michele Lang. Thank you for allowing me the opportunity to care for your patient. Please do not hesitate to contact me should you have any other questions. Sincerely, Dr. Eldora Blanch  Otolaryngology Clinic Note Referring provider: Dr. Vicci HPI:  Michele Lang is a 48 y.o. female kindly referred by Dr. Vicci for evaluation of bilateral hearing loss  Initial visit (10/2023): Patient reports: ongoing bilateral hearing loss for over a year, getting worse. She also reports that she has been having some headaches. Denies antecedent event - no URI, no sinus infection or trauma. Never had any pain. Intermittent fullness. She reports that she can intermittently pop her ears which makes her hearing better. Nothing otherwise makes it worse or better. Some nasal dryness, denies history of frequent sinus infections, or allergies She is on prednisone  currently - 10mg  daily. Patient denies: ear pain, vertigo, drainage, tinnitus Patient additionally denies: deep pain in ear canal, eustachian tube symptoms such as popping/crackling, sensitive to pressure changes Patient also denies barotrauma, vestibular suppressant use, ototoxic medication use Prior ear surgery: no Denies history of frequent ear infections.   ENT Surgery: no Personal or FHx of bleeding dz or anesthesia difficulty: no  AP/AC: no  Tobacco: no  PMHx: Migraines, HTN, RA on Plaquenil, Anxiety Independent Review of Additional Tests or Records:  Dr. Vicci (09/06/2023) referral notes: difficulty hearing ongoing for a year; no cerumen noted; Dx: Hearing Loss; Rx: ref to ENT Labs: CBC/CMP/CRP, Sed 08/14/2023: WBC 11, BUN/Cr wnl, Sed 77, CRP 18.4 10/2023 Audiogram was independently reviewed and interpreted by me and it reveals   SNHL= Sensorineural hearing loss  PMH/Meds/All/SocHx/FamHx/ROS:   Past Medical History:  Diagnosis Date   Anemia    Anxiety     Arthritis    B12 deficiency 03/20/2022   Depression    Drug therapy 11/08/2021   GERD (gastroesophageal reflux disease)    Heart murmur    High risk medication use 05/12/2015   Hypertension    Iron deficiency 03/16/2022   Non-compliance 08/29/2016   Sleep apnea      Past Surgical History:  Procedure Laterality Date   BREAST SURGERY     FOOT SURGERY      Family History  Problem Relation Age of Onset   Hypertension Mother    Heart disease Mother    Hypertension Father    Hypertension Sister    Depression Maternal Uncle    Depression Maternal Grandmother    Rheum arthritis Maternal Grandmother    Asthma Son    Allergies Son    Colon cancer Neg Hx    Esophageal cancer Neg Hx    Rectal cancer Neg Hx    Stomach cancer Neg Hx      Social Connections: Socially Isolated (09/06/2023)   Social Connection and Isolation Panel    Frequency of Communication with Friends and Family: Once a week    Frequency of Social Gatherings with Friends and Family: Once a week    Attends Religious Services: Never    Database administrator or Organizations: No    Attends Engineer, structural: Never    Marital Status: Never married      Current Outpatient Medications:    acetaminophen  (TYLENOL ) 650 MG CR tablet, Take 650 mg by mouth as needed for pain., Disp: , Rfl:    albuterol  (VENTOLIN  HFA) 108 (90 Base) MCG/ACT inhaler, Inhale 1-2 puffs into the lungs every 6 (six) hours as needed.,  Disp: 8 g, Rfl: 0   amLODipine  (NORVASC ) 10 MG tablet, TAKE ONE TABLET BY MOUTH EVERY DAY (please keep upcoming appt for more refills), Disp: 30 tablet, Rfl: 0   azelastine (ASTELIN) 0.1 % nasal spray, Place 2 sprays into both nostrils 2 (two) times daily. Use in each nostril as directed, Disp: 30 mL, Rfl: 12   benzonatate  (TESSALON ) 100 MG capsule, Take 1-2 capsules (100-200 mg total) by mouth 3 (three) times daily as needed., Disp: 30 capsule, Rfl: 0   busPIRone  (BUSPAR ) 5 MG tablet, TAKE ONE TABLET BY  MOUTH ONCE OR TWICE DAILY (FOR anxiety), Disp: 180 tablet, Rfl: 0   EPINEPHrine  0.3 mg/0.3 mL IJ SOAJ injection, Inject 0.3 mLs into the muscle as needed., Disp: , Rfl:    escitalopram (LEXAPRO) 10 MG tablet, Take by mouth., Disp: , Rfl:    fluticasone (FLONASE) 50 MCG/ACT nasal spray, Place 2 sprays into both nostrils in the morning and at bedtime., Disp: 16 g, Rfl: 6   hydrochlorothiazide  (HYDRODIURIL ) 12.5 MG tablet, Take 1 tablet (12.5 mg total) by mouth daily., Disp: 90 tablet, Rfl: 3   ibuprofen  (ADVIL ) 600 MG tablet, Take 1 tablet (600 mg total) by mouth every 8 (eight) hours as needed., Disp: 21 tablet, Rfl: 0   ORENCIA CLICKJECT 125 MG/ML SOAJ, Inject 125 mg into the skin once a week., Disp: , Rfl:    OVER THE COUNTER MEDICATION, B 12 injection, one injection per month., Disp: , Rfl:    potassium chloride  (KLOR-CON  M) 10 MEQ tablet, Take 1 tablet (10 mEq total) by mouth daily., Disp: 30 tablet, Rfl: 6   SUMAtriptan (IMITREX) 50 MG tablet, Take by mouth., Disp: , Rfl:    tirzepatide  (ZEPBOUND ) 2.5 MG/0.5ML injection vial, Inject 2.5 mg into the skin once a week., Disp: 2 mL, Rfl: 0   Physical Exam:   BP (!) 144/81 (BP Location: Left Arm, Patient Position: Sitting, Cuff Size: Large)   Pulse (!) 106   Ht 5' 2 (1.575 m)   SpO2 (!) 89%   BMI 39.87 kg/m   Salient findings:  CN II-XII intact Given history and complaints, ear microscopy was indicated and performed for evaluation with findings as below in physical exam section and in procedures; Bilateral EAC clear and TM intact with bilateral serous effusion with mild pars flaccida retraction b/l Weber 512: mid Rinne 512: AC > BC b/l  Anterior rhinoscopy: Septum relatively midline, mild dryness; bilateral inferior turbinates without significant hypertrophy No lesions of oral cavity/oropharynx No obviously palpable neck masses/lymphadenopathy/thyromegaly No respiratory distress or stridor  Seprately Identifiable Procedures:  Prior to  initiating any procedures, risks/benefits/alternatives were explained to the patient and verbal consent obtained. Procedure: Bilateral ear microscopy using microscope (CPT [id number]) Pre-procedure diagnosis: bilateral conductive hearing loss Post-procedure diagnosis: same Indication: see above; given patient's otologic complaints and history, for improved and comprehensive examination of external ear and tympanic membrane, bilateral otologic examination using microscope was performed. Prior to proceeding, verbal consent was obtained after discussion of R/B/A  Procedure: Patient was placed semi-recumbent. Both ear canals were examined using the microscope with findings above. Patient tolerated the procedure well.   Impression & Plans:  Michele Lang is a 48 y.o. female with:  1. Conductive hearing loss, bilateral   2. Dysfunction of both eustachian tubes    Noted hearing issues for over a year, with noted b/l mild CHL and modest retraction. Suspect ETD based on her symptoms and exam, and she's already on pred for RA. As such,  will have her trial sprays - flonase and astelin BID If no improvement, can consider CT and possible PE tubes  She is in agreement F/u 3 months with audio  See below regarding exact medications prescribed this encounter including dosages and route: Meds ordered this encounter  Medications   fluticasone (FLONASE) 50 MCG/ACT nasal spray    Sig: Place 2 sprays into both nostrils in the morning and at bedtime.    Dispense:  16 g    Refill:  6   azelastine (ASTELIN) 0.1 % nasal spray    Sig: Place 2 sprays into both nostrils 2 (two) times daily. Use in each nostril as directed    Dispense:  30 mL    Refill:  12      Thank you for allowing me the opportunity to care for your patient. Please do not hesitate to contact me should you have any other questions.  Sincerely, Eldora Blanch, MD Otolaryngologist (ENT), [facility] Phone:  [phone] Fax: [phone]  11/19/2023, 10:03 AM   MDM:  Level 4 - [phone] Complexity/Problems addressed: mod - chronic problem, worse Data complexity: mod - independent review of notes, labs, ordering test - Morbidity: mod  - Drug prescribed or managed: y

## 2023-11-19 NOTE — Patient Instructions (Signed)
 Use two sprays of flonase in each nostril twice per day  right after, use astelin spray two sprays each nostril twice per day  Pope your ears multiple times per day

## 2023-11-21 ENCOUNTER — Other Ambulatory Visit: Payer: Self-pay | Admitting: Licensed Clinical Social Worker

## 2023-11-27 ENCOUNTER — Encounter: Payer: Self-pay | Admitting: Audiology

## 2023-11-27 NOTE — Patient Instructions (Signed)
 Visit Information  Thank you for taking time to visit with me today. Please don't hesitate to contact me if I can be of assistance to you before our next scheduled appointment.  Your next care management appointment is by telephone on 08/19 at 1 PM  Please call the care guide team at [phone] if you need to cancel, schedule, or reschedule an appointment.   Please call the Suicide and Crisis Lifeline: 988 go to [facility] Urgent [facility] [address], [place] [phone]) call 911 if you are experiencing a Mental Health or Behavioral Health Crisis or need someone to talk to.  Rolin Kerns, LCSW [place]  [facility], [facility] Clinical Social Worker Direct Dial: [phone]  Fax: [phone] Website: [email or web address] 9:24 AM

## 2023-11-27 NOTE — Patient Outreach (Signed)
 Complex Care Management   Visit Note  11/21/2023  Name:  Michele Lang MRN: [id number] DOB: [birth year]  Situation: Referral received for Complex Care Management related to Stress I obtained verbal consent from Patient.  Visit completed with pt  on the phone  Background:   Past Medical History:  Diagnosis Date   Anemia    Anxiety    Arthritis    B12 deficiency 03/20/2022   Depression    Drug therapy 11/08/2021   GERD (gastroesophageal reflux disease)    Heart murmur    High risk medication use 05/12/2015   Hypertension    Iron deficiency 03/16/2022   Non-compliance 08/29/2016   Sleep apnea     Assessment: Patient Reported Symptoms:  Cognitive Cognitive Status: No symptoms reported, Alert and oriented to person, place, and time Cognitive/Intellectual Conditions Management [RPT]: None reported or documented in medical history or problem list      Neurological Neurological Review of Symptoms: No symptoms reported    HEENT HEENT Symptoms Reported: No symptoms reported      Cardiovascular Cardiovascular Symptoms Reported: No symptoms reported    Respiratory Respiratory Symptoms Reported: No symptoms reported    Endocrine Endocrine Symptoms Reported: No symptoms reported    Gastrointestinal Gastrointestinal Symptoms Reported: No symptoms reported      Genitourinary Genitourinary Symptoms Reported: No symptoms reported    Integumentary Integumentary Symptoms Reported: No symptoms reported    Musculoskeletal Musculoskelatal Symptoms Reviewed: No symptoms reported        Psychosocial Psychosocial Symptoms Reported: Other Additional Psychological Details: Pt endorses overwhelm with financial constraints associated with moving. Solution focused strategies discussed            09/06/2023    2:37 PM  Depression screen PHQ 2/9  Decreased Interest 1  Down, Depressed, Hopeless 1  PHQ - 2 Score 2  Altered sleeping 0  Tired, decreased energy 1  Change in  appetite 0  Feeling bad or failure about yourself  0  Trouble concentrating 1  Moving slowly or fidgety/restless 0  Suicidal thoughts 0  PHQ-9 Score 4  Difficult doing work/chores Somewhat difficult    There were no vitals filed for this visit.  Medications Reviewed Today     Reviewed by Zuley Lutter D, LCSW (Social Worker) on 11/27/23 at 0848  Med List Status: <None>   Medication Order Taking? Sig Documenting Provider Last Dose Status Informant  acetaminophen  (TYLENOL ) 650 MG CR tablet [id number]  Take 650 mg by mouth as needed for pain. [provider]  Active   albuterol  (VENTOLIN  HFA) 108 (90 Base) MCG/ACT inhaler [id number]  Inhale 1-2 puffs into the lungs every 6 (six) hours as needed. Vivienne Nest M, PA-C  Active   amLODipine  (NORVASC ) 10 MG tablet [id number]  TAKE ONE TABLET BY MOUTH EVERY DAY (please keep upcoming appt for more refills) Vicci Barnie NOVAK, MD  Active   azelastine  (ASTELIN ) 0.1 % nasal spray [id number]  Place 2 sprays into both nostrils 2 (two) times daily. Use in each nostril as directed Tobie Eldora NOVAK, MD  Active   benzonatate  (TESSALON ) 100 MG capsule [id number]  Take 1-2 capsules (100-200 mg total) by mouth 3 (three) times daily as needed. Vivienne Nest M, PA-C  Active   busPIRone  (BUSPAR ) 5 MG tablet [id number]  TAKE ONE TABLET BY MOUTH ONCE OR TWICE DAILY (FOR anxiety) Vicci Barnie NOVAK, MD  Active   EPINEPHrine  0.3 mg/0.3 mL IJ SOAJ injection [id number]  Inject 0.3 mLs into the muscle as needed.  [provider]  Active   escitalopram (LEXAPRO) 10 MG tablet [id number]  Take by mouth. [provider]  Active   fluticasone  (FLONASE ) 50 MCG/ACT nasal spray [id number]  Place 2 sprays into both nostrils in the morning and at bedtime. Tobie Eldora NOVAK, MD  Active   hydrochlorothiazide  (HYDRODIURIL ) 12.5 MG tablet [id number]  Take 1 tablet (12.5 mg total) by mouth daily. Vicci Barnie NOVAK, MD  Active   ibuprofen  (ADVIL ) 600 MG tablet  [id number]  Take 1 tablet (600 mg total) by mouth every 8 (eight) hours as needed. Sherrell Rocky MARLA DEVONNA  Active   ORENCIA CLICKJECT 125 MG/ML SOAJ [id number]  Inject 125 mg into the skin once a week. [provider]  Active   OVER THE COUNTER MEDICATION [id number]  B 12 injection, one injection per month. [provider]  Active   potassium chloride  (KLOR-CON  M) 10 MEQ tablet [id number]  Take 1 tablet (10 mEq total) by mouth daily. Vicci Barnie NOVAK, MD  Active   SUMAtriptan (IMITREX) 50 MG tablet [id number]  Take by mouth. [provider]  Active   tirzepatide  (ZEPBOUND ) 2.5 MG/0.5ML injection vial [id number]  Inject 2.5 mg into the skin once a week. Vicci Barnie NOVAK, MD  Active            Med Note ANGELICA, ROLIN D   Tue Sep 25, 2023  3:35 PM) New prescription, pt hasn't filled yet             Recommendation:   Continue Current Plan of Care  Follow Up Plan:   Telephone follow-up in 1 month  ROLIN Kerns, LCSW [facility]  [facility], [facility] Clinical Social Worker Direct Dial: [phone]  Fax: [phone] Website: [email or web address] 9:24 AM

## 2023-12-04 ENCOUNTER — Other Ambulatory Visit: Payer: Self-pay | Admitting: Internal Medicine

## 2023-12-04 DIAGNOSIS — I10 Essential (primary) hypertension: Secondary | ICD-10-CM

## 2023-12-30 DIAGNOSIS — J40 Bronchitis, not specified as acute or chronic: Secondary | ICD-10-CM | POA: Diagnosis not present

## 2023-12-30 DIAGNOSIS — M940 Chondrocostal junction syndrome [Tietze]: Secondary | ICD-10-CM | POA: Diagnosis not present

## 2023-12-30 DIAGNOSIS — J069 Acute upper respiratory infection, unspecified: Secondary | ICD-10-CM | POA: Diagnosis not present

## 2024-01-04 ENCOUNTER — Telehealth: Payer: Self-pay | Admitting: Internal Medicine

## 2024-01-04 NOTE — Telephone Encounter (Signed)
 Confirmed appt for 8/18

## 2024-01-07 ENCOUNTER — Ambulatory Visit: Admitting: Internal Medicine

## 2024-01-08 ENCOUNTER — Encounter: Payer: Self-pay | Admitting: Licensed Clinical Social Worker

## 2024-01-08 ENCOUNTER — Telehealth: Payer: Self-pay | Admitting: Licensed Clinical Social Worker

## 2024-01-08 NOTE — Patient Instructions (Signed)
 Michele Lang - I am sorry I was unable to reach you today for our scheduled appointment. I work with Vicci Barnie NOVAK, MD and am calling to support your healthcare needs. Please contact me at [phone] at your earliest convenience. I look forward to speaking with you soon.   Thank you,  Rolin Kerns, LCSW   [facility], [facility] Clinical Social Worker Direct Dial: [phone]  Fax: [phone] Website: [email or web address] 4:19 PM

## 2024-01-14 ENCOUNTER — Ambulatory Visit (HOSPITAL_BASED_OUTPATIENT_CLINIC_OR_DEPARTMENT_OTHER): Admitting: Internal Medicine

## 2024-01-14 DIAGNOSIS — Z538 Procedure and treatment not carried out for other reasons: Secondary | ICD-10-CM

## 2024-01-14 NOTE — Progress Notes (Unsigned)
 Virtual Visit via Video Note  I connected with Michele Lang on 01/14/2024 at 4:18 PM by a video enabled telemedicine application and verified that I am speaking with the correct person using two identifiers.  Location: Patient: *** Provider: Office   I discussed the limitations of evaluation and management by telemedicine and the availability of in person appointments. The patient expressed understanding and agreed to proceed.  History of Present Illness: Patient with history of HTN, RA with positive RA factor, anxiety, anemia    Outpatient Encounter Medications as of 01/14/2024  Medication Sig Note   acetaminophen  (TYLENOL ) 650 MG CR tablet Take 650 mg by mouth as needed for pain.    albuterol  (VENTOLIN  HFA) 108 (90 Base) MCG/ACT inhaler Inhale 1-2 puffs into the lungs every 6 (six) hours as needed.    amLODipine  (NORVASC ) 10 MG tablet TAKE ONE TABLET BY MOUTH EVERY DAY    azelastine  (ASTELIN ) 0.1 % nasal spray Place 2 sprays into both nostrils 2 (two) times daily. Use in each nostril as directed    benzonatate  (TESSALON ) 100 MG capsule Take 1-2 capsules (100-200 mg total) by mouth 3 (three) times daily as needed.    busPIRone  (BUSPAR ) 5 MG tablet TAKE ONE TABLET BY MOUTH ONCE OR TWICE DAILY (FOR anxiety)    EPINEPHrine  0.3 mg/0.3 mL IJ SOAJ injection Inject 0.3 mLs into the muscle as needed.    escitalopram (LEXAPRO) 10 MG tablet Take by mouth.    fluticasone  (FLONASE ) 50 MCG/ACT nasal spray Place 2 sprays into both nostrils in the morning and at bedtime.    hydrochlorothiazide  (HYDRODIURIL ) 12.5 MG tablet Take 1 tablet (12.5 mg total) by mouth daily.    ibuprofen  (ADVIL ) 600 MG tablet Take 1 tablet (600 mg total) by mouth every 8 (eight) hours as needed.    ORENCIA CLICKJECT 125 MG/ML SOAJ Inject 125 mg into the skin once a week.    OVER THE COUNTER MEDICATION B 12 injection, one injection per month.    potassium chloride  (KLOR-CON  M) 10 MEQ tablet Take 1 tablet (10 mEq total)  by mouth daily.    SUMAtriptan (IMITREX) 50 MG tablet Take by mouth.    tirzepatide  (ZEPBOUND ) 2.5 MG/0.5ML injection vial Inject 2.5 mg into the skin once a week. 09/25/2023: New prescription, pt hasn't filled yet    No facility-administered encounter medications on file as of 01/14/2024.      Observations/Objective:   Assessment and Plan:   Follow Up Instructions:    I discussed the assessment and treatment plan with the patient. The patient was provided an opportunity to ask questions and all were answered. The patient agreed with the plan and demonstrated an understanding of the instructions.   The patient was advised to call back or seek an in-person evaluation if the symptoms worsen or if the condition fails to improve as anticipated.  I spent ------ minutes dedicated to the care of this patient on the date of this encounter to include previsit review of  This note has been created with Education officer, environmental. Any transcriptional errors are unintentional.  Barnie Louder, MD

## 2024-01-23 ENCOUNTER — Other Ambulatory Visit: Payer: Self-pay | Admitting: Internal Medicine

## 2024-01-23 ENCOUNTER — Telehealth: Payer: Self-pay | Admitting: Internal Medicine

## 2024-01-23 DIAGNOSIS — I10 Essential (primary) hypertension: Secondary | ICD-10-CM

## 2024-01-23 MED ORDER — AMLODIPINE BESYLATE 10 MG PO TABS: 10.0000 mg | ORAL_TABLET | Freq: Every day | ORAL | 0 refills | Status: DC

## 2024-01-23 NOTE — Telephone Encounter (Signed)
 Requested Prescriptions  Pending Prescriptions Disp Refills   amLODipine  (NORVASC ) 10 MG tablet 30 tablet 0    Sig: Take 1 tablet (10 mg total) by mouth daily.     Cardiovascular: Calcium Channel Blockers 2 Failed - 01/23/2024 12:39 PM      Failed - Last BP in normal range    BP Readings from Last 1 Encounters:  11/19/23 (!) 144/81         Passed - Last Heart Rate in normal range    Pulse Readings from Last 1 Encounters:  11/19/23 (!) 106         Passed - Valid encounter within last 6 months    Recent Outpatient Visits           1 week ago Appointment canceled by hospital   [place] [facility] [place] - [facility] Dept Of [place]. [facility] Vicci Sober B, MD   4 months ago Class 3 severe obesity due to excess calories with serious comorbidity and body mass index (BMI) of 40.0 to 44.9 in adult   [facility] - [facility] Dept Of [place]. [facility] Vicci Sober NOVAK, MD   4 months ago Essential hypertension   [place] [facility] [place] - A Dept Of [place]. [facility] Vicci Sober NOVAK, MD   10 months ago Encounter for immunization   [facility] [place] - A Dept Of [place]. [facility] Vicci Sober NOVAK, MD   1 year ago Essential hypertension    [facility] [place] - A Dept Of [place]. [facility] Vicci Sober NOVAK, MD

## 2024-01-23 NOTE — Telephone Encounter (Signed)
 Patient confirmed appointment for 01/25/2024, through volunteer call.

## 2024-01-23 NOTE — Telephone Encounter (Signed)
 Copied from CRM #[id number]. Topic: Clinical - Medication Refill >> Jan 23, 2024 11:26 AM Selinda RAMAN wrote: Medication: amLODipine  (NORVASC ) 10 MG tablet  Has the patient contacted their pharmacy? No   This is the patient's preferred pharmacy:    [facility] [address] [place] - [address] [place] [address] Phone: [phone] Fax: [phone]  Is this the correct pharmacy for this prescription? Yes If no, delete pharmacy and type the correct one.   Has the prescription been filled recently? No  Is the patient out of the medication? Yes she has been out over a week  Has the patient been seen for an appointment in the last year OR does the patient have an upcoming appointment? Yes  Can we respond through MyChart? Yes  Please assist patient further

## 2024-01-24 NOTE — Telephone Encounter (Signed)
 Requested Prescriptions  Refused Prescriptions Disp Refills   amLODipine  (NORVASC ) 10 MG tablet [Pharmacy Med Name: AMLODIPINE  BESYLATE 10MG TABLETS] 90 tablet     Sig: TAKE 1 TABLET BY MOUTH EVERY DAY*NEED OFFICE VISIT BEFORE FURTHER REFILLS     Cardiovascular: Calcium Channel Blockers 2 Failed - 01/24/2024 10:29 AM      Failed - Last BP in normal range    BP Readings from Last 1 Encounters:  11/19/23 (!) 144/81         Passed - Last Heart Rate in normal range    Pulse Readings from Last 1 Encounters:  11/19/23 (!) 106         Passed - Valid encounter within last 6 months    Recent Outpatient Visits           1 week ago Appointment canceled by hospital   [place] [facility] [place] - [facility] Dept Of [place]. [facility] Vicci Sober B, MD   4 months ago Class 3 severe obesity due to excess calories with serious comorbidity and body mass index (BMI) of 40.0 to 44.9 in adult   [facility] - A Dept Of [place]. [facility] Vicci Sober NOVAK, MD   4 months ago Essential hypertension   [place] [facility] [place] - A Dept Of [place]. [facility] Vicci Sober NOVAK, MD   10 months ago Encounter for immunization   [facility] [place] - A Dept Of [place]. [facility] Vicci Sober NOVAK, MD   1 year ago Essential hypertension   [place] [facility] [place] - A Dept Of [place]. [facility] Vicci Sober NOVAK, MD

## 2024-01-25 ENCOUNTER — Ambulatory Visit: Attending: Internal Medicine | Admitting: Internal Medicine

## 2024-01-25 ENCOUNTER — Encounter: Payer: Self-pay | Admitting: Internal Medicine

## 2024-01-25 VITALS — BP 142/91 | HR 78 | Ht 62.0 in | Wt 216.0 lb

## 2024-01-25 DIAGNOSIS — M0579 Rheumatoid arthritis with rheumatoid factor of multiple sites without organ or systems involvement: Secondary | ICD-10-CM

## 2024-01-25 DIAGNOSIS — I10 Essential (primary) hypertension: Secondary | ICD-10-CM

## 2024-01-25 DIAGNOSIS — Z13820 Encounter for screening for osteoporosis: Secondary | ICD-10-CM

## 2024-01-25 DIAGNOSIS — Z23 Encounter for immunization: Secondary | ICD-10-CM | POA: Diagnosis not present

## 2024-01-25 DIAGNOSIS — Z6839 Body mass index (BMI) 39.0-39.9, adult: Secondary | ICD-10-CM

## 2024-01-25 DIAGNOSIS — Z1231 Encounter for screening mammogram for malignant neoplasm of breast: Secondary | ICD-10-CM

## 2024-01-25 DIAGNOSIS — F321 Major depressive disorder, single episode, moderate: Secondary | ICD-10-CM

## 2024-01-25 DIAGNOSIS — E66812 Obesity, class 2: Secondary | ICD-10-CM

## 2024-01-25 MED ORDER — AMLODIPINE BESYLATE 10 MG PO TABS: 10.0000 mg | ORAL_TABLET | Freq: Every day | ORAL | 1 refills | Status: DC

## 2024-01-25 MED ORDER — OLMESARTAN MEDOXOMIL 20 MG PO TABS: 20.0000 mg | ORAL_TABLET | Freq: Every day | ORAL | 3 refills | Status: AC

## 2024-01-25 NOTE — Progress Notes (Signed)
 Patient ID: Michele Lang, female    DOB: 1976-05-04  MRN: [id number]  CC: Hypertension (HTN f/u. [facility] alternative for zepbound  - insurance didn't approve/Flu vax administered on 01/25/24 - C.A. Janiece to mammogram. )   Subjective: Michele Lang is a 48 y.o. female who presents for chronic ds management. Her concerns today include:  Patient with history of HTN, RA with positive RA factor, anxiety, anemia   Discussed the use of AI scribe software for clinical note transcription with the patient, who gave verbal consent to proceed.  History of Present Illness Michele Lang is a 48 year old female with hypertension and rheumatoid arthritis who presents for follow-up on blood pressure and weight management.  Obesity: She is experiencing challenges with weight gain, which she attributes in part to her prednisone  use for rheumatoid arthritis. Her insurance denied coverage for Wegovy  and Zepbound , and she is exploring alternative weight management options. She describes her appetite as decreased, with snacking on peanut butter crackers and chips, and has increased her fruit intake. She drinks juices, Gatorade, Kool-Aid, and occasionally Pepsi, and acknowledges the need to reduce sugary drinks.  RA: She is currently on prednisone  10 mg daily for rheumatoid arthritis and has been on this dose for about a year. She also takes Plaquenil and Orencia for her condition. She reports that previous medications for her rheumatoid arthritis caused gastrointestinal issues. She had a bone density study in 2020, which was normal. She does not take calcium or vitamin D supplements and reports not having had a menstrual cycle recently.  HTN: She is on amlodipine  10 mg daily and hydrochlorothiazide  12.5 mg daily. She recently found a pack of amlodipine  after being without it for about a week and took one last night. She has not been checking her blood pressure regularly due to a missing cuff and  is trying to limit her salt intake.  Pos Dep screen: She screened positive for depression, feeling overwhelmed by personal and family health issues, including her son's health problems. She has tried medications like escitalopram and buspirone  in the past, which caused anxiety, and she stopped them. She is open to counseling but prefers to avoid medication for now. No thoughts of self-harm or harm to others.  HM: Due for mammogram, flu shot, Prevnar 20 and Shingrix vaccines.  She confirms not having had shingles vaccine in the past.  I advised her to get the Prevnar 20 and Shingrix vaccine at any outside pharmacy.  She is agreeable to receiving the flu shot from us  today.    Patient Active Problem List   Diagnosis Date Noted   Class 2 severe obesity due to excess calories with serious comorbidity and body mass index (BMI) of 37.0 to 37.9 in adult (HCC) 09/06/2023   Iron deficiency anemia 01/24/2022   GAD (generalized anxiety disorder) 01/24/2022   Axillary lymphadenopathy 09/06/2020   Rheumatoid arthritis involving multiple sites with positive rheumatoid factor (HCC) 05/12/2015   Chest pain, atypical 11/22/2013   Benign essential hypertension 01/04/2013   Cough 01/04/2013   Pleural effusion 01/03/2013     Current Outpatient Medications on File Prior to Visit  Medication Sig Dispense Refill   acetaminophen  (TYLENOL ) 650 MG CR tablet Take 650 mg by mouth as needed for pain.     albuterol  (VENTOLIN  HFA) 108 (90 Base) MCG/ACT inhaler Inhale 1-2 puffs into the lungs every 6 (six) hours as needed. 8 g 0   azelastine  (ASTELIN ) 0.1 % nasal spray Place 2 sprays into  both nostrils 2 (two) times daily. Use in each nostril as directed 30 mL 12   benzonatate  (TESSALON ) 100 MG capsule Take 1-2 capsules (100-200 mg total) by mouth 3 (three) times daily as needed. 30 capsule 0   EPINEPHrine  0.3 mg/0.3 mL IJ SOAJ injection Inject 0.3 mLs into the muscle as needed.     escitalopram (LEXAPRO) 10 MG tablet  Take by mouth.     fluticasone  (FLONASE ) 50 MCG/ACT nasal spray Place 2 sprays into both nostrils in the morning and at bedtime. 16 g 6   hydrochlorothiazide  (HYDRODIURIL ) 12.5 MG tablet Take 1 tablet (12.5 mg total) by mouth daily. 90 tablet 3   ibuprofen  (ADVIL ) 600 MG tablet Take 1 tablet (600 mg total) by mouth every 8 (eight) hours as needed. 21 tablet 0   ORENCIA CLICKJECT 125 MG/ML SOAJ Inject 125 mg into the skin once a week.     OVER THE COUNTER MEDICATION B 12 injection, one injection per month.     SUMAtriptan (IMITREX) 50 MG tablet Take by mouth.     potassium chloride  (KLOR-CON  M) 10 MEQ tablet Take 1 tablet (10 mEq total) by mouth daily. (Patient not taking: Reported on 01/25/2024) 30 tablet 6   No current facility-administered medications on file prior to visit.    Allergies  Allergen Reactions   Infliximab  Anaphylaxis   Cozaar  [Losartan  Potassium] Other (See Comments)    Caused CP   Penicillins Rash    Has patient had a PCN reaction causing immediate rash, facial/tongue/throat swelling, SOB or lightheadedness with hypotension: Yes  Has patient had a PCN reaction causing severe rash involving mucus membranes or skin necrosis: Yes  Has patient had a PCN reaction that required hospitalization No  Has patient had a PCN reaction occurring within the last 10 years: No  If all of the above answers are NO, then may proceed with Cephalosporin use.  Has patient had a PCN reaction causing immediate rash, facial/tongue/throat swelling, SOB or lightheadedness with hypotension: Yes, Has patient had a PCN reaction causing severe rash involving mucus membranes or skin necrosis: Yes, Has patient had a PCN reaction that required hospitalization No, Has patient had a PCN reaction occurring within the last 10 years: No, If all of the above answers are NO, then may proceed with Cephalosporin use.    Social History   Socioeconomic History   Marital status: Single    Spouse name: Not on  file   Number of children: Not on file   Years of education: Not on file   Highest education level: Some college, no degree  Occupational History   Not on file  Tobacco Use   Smoking status: Never   Smokeless tobacco: Never  Vaping Use   Vaping status: Never Used  Substance and Sexual Activity   Alcohol use: No   Drug use: No   Sexual activity: Never  Other Topics Concern   Not on file  Social History Narrative   Not on file   Social Drivers of Health   Financial Resource Strain: High Risk (09/06/2023)   Overall Financial Resource Strain (CARDIA)    Difficulty of Paying Living Expenses: Hard  Food Insecurity: Food Insecurity Present (09/06/2023)   Hunger Vital Sign    Worried About Running Out of Food in the Last Year: Never true    Ran Out of Food in the Last Year: Sometimes true  Transportation Needs: Unmet Transportation Needs (09/06/2023)   PRAPARE - Transportation    Lack of Transportation (  Medical): Yes    Lack of Transportation (Non-Medical): Yes  Physical Activity: Inactive (09/06/2023)   Exercise Vital Sign    Days of Exercise per Week: 0 days    Minutes of Exercise per Session: 0 min  Stress: No Stress Concern Present (09/06/2023)   [employer] of Occupational Health - Occupational Stress Questionnaire    Feeling of Stress : Only a little  Recent Concern: Stress - Stress Concern Present (07/03/2023)   [employer] of Occupational Health - Occupational Stress Questionnaire    Feeling of Stress : Rather much  Social Connections: Socially Isolated (09/06/2023)   Social Connection and Isolation Panel    Frequency of Communication with Friends and Family: Once a week    Frequency of Social Gatherings with Friends and Family: Once a week    Attends Religious Services: Never    Database administrator or Organizations: No    Attends Banker Meetings: Never    Marital Status: Never married  Intimate Partner Violence: Not At Risk (07/03/2023)    Humiliation, Afraid, Rape, and Kick questionnaire    Fear of Current or Ex-Partner: No    Emotionally Abused: No    Physically Abused: No    Sexually Abused: No    Family History  Problem Relation Age of Onset   Hypertension Mother    Heart disease Mother    Hypertension Father    Hypertension Sister    Depression Maternal Uncle    Depression Maternal Grandmother    Rheum arthritis Maternal Grandmother    Asthma Son    Allergies Son    Colon cancer Neg Hx    Esophageal cancer Neg Hx    Rectal cancer Neg Hx    Stomach cancer Neg Hx     Past Surgical History:  Procedure Laterality Date   BREAST SURGERY     FOOT SURGERY      ROS: Review of Systems Negative except as stated above  PHYSICAL EXAM: BP (!) 142/91   Pulse 78   Ht 5' 2 (1.575 m)   Wt 216 lb (98 kg)   SpO2 98%   BMI 39.51 kg/m   Wt Readings from Last 3 Encounters:  01/25/24 216 lb (98 kg)  09/06/23 218 lb (98.9 kg)  07/03/23 205 lb (93 kg)    Physical Exam  General appearance - alert, well appearing, obese middle-age African-American female and in no distress Mental status - normal mood, behavior, speech, dress, motor activity, and thought processes Neck - supple, no significant adenopathy Chest - clear to auscultation, no wheezes, rales or rhonchi, symmetric air entry Heart - normal rate, regular rhythm, normal S1, S2, no murmurs, rubs, clicks or gallops Musculoskeletal -she has some enlargement of the MCP and PIP joints of both hands with deformity of some of the PIP joints Extremities -no lower extremity edema     01/25/2024    2:10 PM 09/06/2023    2:37 PM 07/03/2023   11:31 AM  Depression screen PHQ 2/9  Decreased Interest 3 1 3   Down, Depressed, Hopeless 2 1 3   PHQ - 2 Score 5 2 6   Altered sleeping 3 0 1  Tired, decreased energy 3 1 0  Change in appetite 2 0 0  Feeling bad or failure about yourself  2 0 0  Trouble concentrating 3 1 0  Moving slowly or fidgety/restless 0 0 0  Suicidal  thoughts 0 0 0  PHQ-9 Score 18 4 7   Difficult doing work/chores Somewhat  difficult Somewhat difficult        Latest Ref Rng & Units 02/06/2023    3:59 PM 08/15/2020    1:09 PM 08/01/2018    5:20 PM  CMP  Glucose 70 - 99 mg/dL 885  85  894   BUN 6 - 20 mg/dL 14  8  15    Creatinine 0.44 - 1.00 mg/dL 9.20  9.40  9.25   Sodium 135 - 145 mmol/L 140  133  134   Potassium 3.5 - 5.1 mmol/L 3.6  3.7  3.8   Chloride 98 - 111 mmol/L 101  102  102   CO2 22 - 32 mmol/L 24  24  25    Calcium 8.9 - 10.3 mg/dL 9.0  8.5  8.9   Total Protein 6.5 - 8.1 g/dL   7.8   Total Bilirubin 0.3 - 1.2 mg/dL   0.5   Alkaline Phos 38 - 126 U/L   58   AST 15 - 41 U/L   17   ALT 0 - 44 U/L   13    Lipid Panel  No results found for: CHOL, TRIG, HDL, CHOLHDL, VLDL, LDLCALC, LDLDIRECT  CBC    Component Value Date/Time   WBC 6.1 02/06/2023 1559   RBC 4.85 02/06/2023 1559   HGB 11.3 (L) 02/06/2023 1559   HGB 9.9 (L) 01/24/2022 1504   HCT 37.8 02/06/2023 1559   HCT 33.7 (L) 01/24/2022 1504   PLT 289 02/06/2023 1559   PLT 332 01/24/2022 1504   MCV 77.9 (L) 02/06/2023 1559   MCV 73 (L) 01/24/2022 1504   MCH 23.3 (L) 02/06/2023 1559   MCHC 29.9 (L) 02/06/2023 1559   RDW 15.5 02/06/2023 1559   RDW 19.1 (H) 01/24/2022 1504   LYMPHSABS 1.7 07/25/2018 1921   MONOABS 0.0 (L) 07/25/2018 1921   EOSABS 0.2 07/25/2018 1921   BASOSABS 0.1 07/25/2018 1921    ASSESSMENT AND PLAN: 1. Essential hypertension (Primary) Not at goal.  Continue amlodipine  10 mg daily and HCTZ 12.5 mg daily.  Add Benicar  20 mg daily.  Had reported chest pains with Cozaar  in the past but is willing to try the Benicar .  I see that she had recent chemistry through her rheumatologist. - amLODipine  (NORVASC ) 10 MG tablet; Take 1 tablet (10 mg total) by mouth daily. OFFICE VISIT NEEDED FOR ADDITIONAL REFILLS  Dispense: 90 tablet; Refill: 1 - olmesartan  (BENICAR ) 20 MG tablet; Take 1 tablet (20 mg total) by mouth daily.  Dispense: 30  tablet; Refill: 3  2. Class 2 severe obesity due to excess calories with serious comorbidity and body mass index (BMI) of 39.0 to 39.9 in adult [facility]) Encouraged healthy eating habits.  Strongly encouraged her to eliminate the sugary drinks from the diet. We discussed trying her with phentermine but I would like to see her blood pressure is better before starting phentermine.  We will bring her back in several weeks to recheck her blood pressure with the new medication that was added.  If reading is okay, we will stop the phentermine at that time.  3. Major depressive disorder, single episode, moderate (HCC) Patient denies suicidal ideation.  She is agreeable to being referred for counseling declines medicine at this time. - Ambulatory referral to Psychiatry  4. Rheumatoid arthritis involving multiple sites with positive rheumatoid factor (HCC) On Orencia, Plaquenil and prednisone  through her rheumatologist.  5. Osteoporosis screening Given the prolonged.  That she has been on prednisone  and it has been 5 years since  her last bone density, the study done.  Advised her to purchase Os-Cal plus vitamin D 12 100/400 over-the-counter and take 1 daily - DG Bone Density; Future  6. Need for influenza vaccination - Flu vaccine trivalent PF, 6mos and older(Flulaval,Afluria,Fluarix,Fluzone)  7. Encounter for screening mammogram for malignant neoplasm of breast - MM 3D SCREENING MAMMOGRAM BILATERAL BREAST; Future    Patient was given the opportunity to ask questions.  Patient verbalized understanding of the plan and was able to repeat key elements of the plan.   This documentation was completed using Paediatric nurse.  Any transcriptional errors are unintentional.  Orders Placed This Encounter  Procedures   DG Bone Density   MM 3D SCREENING MAMMOGRAM BILATERAL BREAST   Flu vaccine trivalent PF, 6mos and older(Flulaval,Afluria,Fluarix,Fluzone)   Ambulatory referral to  Psychiatry     Requested Prescriptions   Signed Prescriptions Disp Refills   amLODipine  (NORVASC ) 10 MG tablet 90 tablet 1    Sig: Take 1 tablet (10 mg total) by mouth daily. OFFICE VISIT NEEDED FOR ADDITIONAL REFILLS   olmesartan  (BENICAR ) 20 MG tablet 30 tablet 3    Sig: Take 1 tablet (20 mg total) by mouth daily.    Return in about 2 weeks (around 02/08/2024) for BP recheck and start Phentermine.  Barnie Louder, MD, FACP

## 2024-01-25 NOTE — Patient Instructions (Signed)
 VISIT SUMMARY:  Today, you came in for a follow-up on your blood pressure and weight management. We discussed your challenges with weight gain, partly due to your prednisone  use for rheumatoid arthritis, and explored alternative weight management options. We also reviewed your current medications and addressed your feelings of depression.  YOUR PLAN:  -OBESITY, CLASS 2: Obesity is a condition where you have an excessive amount of body fat. Your weight management is complicated by your prednisone  use. We will recheck your blood pressure before considering phentermine for weight loss. In the meantime, try to reduce sugary drinks and snacks.  -ESSENTIAL HYPERTENSION: Hypertension is high blood pressure. Your blood pressure was 142/91 mmHg today, which is above the target. You recently missed some doses of amlodipine . We have prescribed Benicar  (olmesartan ) 40 mg daily and advised you to limit your salt intake. Please purchase a new blood pressure cuff and recheck your blood pressure in two weeks.  -RHEUMATOID ARTHRITIS ON CHRONIC SYSTEMIC STEROIDS: Rheumatoid arthritis is an autoimmune disease that causes joint inflammation. You are currently managing it with prednisone , Plaquenil, and Orencia. We are concerned about long-term prednisone  use and potential bone loss. We have ordered a bone density study and advised you to take Oscal plus vitamin D daily. This can be purchased over the counter. Look for the Oscal+D 600/400 and take one twice a day or 1200/400 and take once a day  -DEPRESSION: Depression is a mood disorder that causes persistent feelings of sadness and loss of interest. You screened positive for depression and are feeling overwhelmed by personal and family stressors. We have referred you to behavioral health for counseling and removed Buspar  from your medication list.  Please remember to get your pneumonia vaccine known as the Prevnar 20 and shingles vaccine called Shingrix at any local  pharmacy.  INSTRUCTIONS:  Please recheck your blood pressure in two weeks and purchase a new blood pressure cuff. Follow up with the bone density study as ordered. We have referred you to behavioral health for counseling. Continue to limit your salt intake and reduce sugary drinks and snacks.

## 2024-01-26 ENCOUNTER — Encounter: Payer: Self-pay | Admitting: Internal Medicine

## 2024-01-29 ENCOUNTER — Other Ambulatory Visit: Payer: Self-pay | Admitting: Licensed Clinical Social Worker

## 2024-01-29 NOTE — Patient Instructions (Signed)
 Visit Information  Thank you for taking time to visit with me today. Please don't hesitate to contact me if I can be of assistance to you before our next scheduled appointment.  Your next care management appointment is by telephone on 10/07 at 12:30 PM   Please call the care guide team at [phone] if you need to cancel, schedule, or reschedule an appointment.   Please call the Suicide and Crisis Lifeline: 988 go to [facility] [address], [place] [phone]) call 911 if you are experiencing a Mental Health or Behavioral Health Crisis or need someone to talk to.  Rolin Kerns, LCSW [place]  [facility], [facility] Clinical Social Worker Direct Dial: [phone]  Fax: [phone] Website: [email or web address] 12:50 PM

## 2024-01-29 NOTE — Patient Outreach (Signed)
 Complex Care Management   Visit Note  01/29/2024  Name:  Michele Lang MRN: [id number] DOB: 11-21-1975  Situation: Referral received for Complex Care Management related to Mental/Behavioral Health diagnosis Depression I obtained verbal consent from Patient .  Visit completed with Patient  on the phone  Background:   Past Medical History:  Diagnosis Date   Anemia    Anxiety    Arthritis    B12 deficiency 03/20/2022   Depression    Drug therapy 11/08/2021   GERD (gastroesophageal reflux disease)    Heart murmur    High risk medication use 05/12/2015   Hypertension    Iron deficiency 03/16/2022   Non-compliance 08/29/2016   Sleep apnea     Assessment: Patient Reported Symptoms:  Cognitive Cognitive Status: No symptoms reported, Alert and oriented to person, place, and time, Normal speech and language skills Cognitive/Intellectual Conditions Management [RPT]: None reported or documented in medical history or problem list   Health Maintenance Behaviors: Annual physical exam  Neurological Neurological Review of Symptoms: Not assessed    HEENT HEENT Symptoms Reported: Not assessed      Cardiovascular Cardiovascular Symptoms Reported: Not assessed    Respiratory Respiratory Symptoms Reported: Not assesed    Endocrine Endocrine Symptoms Reported: Not assessed    Gastrointestinal Gastrointestinal Symptoms Reported: Not assessed      Genitourinary Genitourinary Symptoms Reported: Not assessed    Integumentary Integumentary Symptoms Reported: Not assessed    Musculoskeletal Musculoskelatal Symptoms Reviewed: Not assessed        Psychosocial Psychosocial Symptoms Reported: Depression - if selected complete PHQ 2-9 Additional Psychological Details: Referral to Counseling completed by PCP on 01/25/24. LCSW discussed additional strategies to assist with decrease in symptoms, such as, journaling Behavioral Management Strategies: Counseling, Coping strategies Major  Change/Loss/Stressor/Fears (CP): Medical condition, self Techniques to Cope with Loss/Stress/Change: Diversional activities      01/29/2024    PHQ2-9 Depression Screening   Little interest or pleasure in doing things    Feeling down, depressed, or hopeless    PHQ-2 - Total Score    Trouble falling or staying asleep, or sleeping too much    Feeling tired or having little energy    Poor appetite or overeating     Feeling bad about yourself - or that you are a failure or have let yourself or your family down    Trouble concentrating on things, such as reading the newspaper or watching television    Moving or speaking so slowly that other people could have noticed.  Or the opposite - being so fidgety or restless that you have been moving around a lot more than usual    Thoughts that you would be better off dead, or hurting yourself in some way    PHQ2-9 Total Score    If you checked off any problems, how difficult have these problems made it for you to do your work, take care of things at home, or get along with other people    Depression Interventions/Treatment      There were no vitals filed for this visit.  Medications Reviewed Today     Reviewed by Ezzard Rolin BIRCH, LCSW (Social Worker) on 01/29/24 at 1242  Med List Status: <None>   Medication Order Taking? Sig Documenting Provider Last Dose Status Informant  acetaminophen  (TYLENOL ) 650 MG CR tablet [id number]  Take 650 mg by mouth as needed for pain. [provider]  Active   albuterol  (VENTOLIN  HFA) 108 (90 Base) MCG/ACT inhaler [id number]  Inhale 1-2 puffs into the lungs every 6 (six) hours as needed. Vivienne Nest M, PA-C  Active   amLODipine  (NORVASC ) 10 MG tablet [id number]  Take 1 tablet (10 mg total) by mouth daily. OFFICE VISIT NEEDED FOR ADDITIONAL REFILLS Vicci Barnie NOVAK, MD  Active   azelastine  (ASTELIN ) 0.1 % nasal spray [id number]  Place 2 sprays into both nostrils 2 (two) times daily. Use in each nostril as  directed Tobie Eldora NOVAK, MD  Active   benzonatate  (TESSALON ) 100 MG capsule [id number]  Take 1-2 capsules (100-200 mg total) by mouth 3 (three) times daily as needed. Vivienne Nest M, PA-C  Active   EPINEPHrine  0.3 mg/0.3 mL IJ SOAJ injection [id number]  Inject 0.3 mLs into the muscle as needed. [provider]  Active   escitalopram (LEXAPRO) 10 MG tablet [id number]  Take by mouth. [provider]  Active   fluticasone  (FLONASE ) 50 MCG/ACT nasal spray [id number]  Place 2 sprays into both nostrils in the morning and at bedtime. Tobie Eldora NOVAK, MD  Active   hydrochlorothiazide  (HYDRODIURIL ) 12.5 MG tablet [id number]  Take 1 tablet (12.5 mg total) by mouth daily. Vicci Barnie NOVAK, MD  Active   ibuprofen  (ADVIL ) 600 MG tablet [id number]  Take 1 tablet (600 mg total) by mouth every 8 (eight) hours as needed. Raspet, Rocky POUR, PA-C  Active   olmesartan  (BENICAR ) 20 MG tablet [id number]  Take 1 tablet (20 mg total) by mouth daily. Vicci Barnie NOVAK, MD  Active   ORENCIA CLICKJECT 125 MG/ML EMMANUEL [id number]  Inject 125 mg into the skin once a week. [provider]  Active   OVER THE COUNTER MEDICATION [id number]  B 12 injection, one injection per month. [provider]  Active   potassium chloride  (KLOR-CON  M) 10 MEQ tablet [id number]  Take 1 tablet (10 mEq total) by mouth daily.  Patient not taking: Reported on 01/25/2024   Vicci Barnie NOVAK, MD  Active   SUMAtriptan (IMITREX) 50 MG tablet [id number]  Take by mouth. [provider]  Active             Recommendation:   Continue Current Plan of Care  Follow Up Plan:   Telephone follow-up in 1 month  Rolin Kerns, LCSW [facility]  [facility], [facility] Clinical Social Worker Direct Dial: [phone]  Fax: [phone] Website: [email or web address] 12:50 PM

## 2024-02-07 ENCOUNTER — Telehealth: Payer: Self-pay | Admitting: Internal Medicine

## 2024-02-07 NOTE — Telephone Encounter (Signed)
 Lvm to confirm appt for 9/19

## 2024-02-08 ENCOUNTER — Ambulatory Visit: Admitting: Internal Medicine

## 2024-02-19 ENCOUNTER — Ambulatory Visit (INDEPENDENT_AMBULATORY_CARE_PROVIDER_SITE_OTHER): Admitting: Audiology

## 2024-02-19 ENCOUNTER — Ambulatory Visit (INDEPENDENT_AMBULATORY_CARE_PROVIDER_SITE_OTHER): Admitting: Otolaryngology

## 2024-02-26 ENCOUNTER — Telehealth: Admitting: Licensed Clinical Social Worker

## 2024-03-10 ENCOUNTER — Telehealth: Payer: Self-pay | Admitting: Licensed Clinical Social Worker

## 2024-03-10 ENCOUNTER — Encounter: Payer: Self-pay | Admitting: Licensed Clinical Social Worker

## 2024-03-10 NOTE — Patient Instructions (Signed)
 Umi R Sarabia - I am sorry I was unable to reach you today for our scheduled appointment. I work with Vicci Barnie NOVAK, MD and am calling to support your healthcare needs. Please contact me at [phone] at your earliest convenience. I look forward to speaking with you soon.   Thank you,  Rolin Kerns, LCSW [place]  [facility], [facility] Clinical Social Worker Direct Dial: [phone]  Fax: [phone] Website: [email or web address] 3:45 PM

## 2024-03-11 ENCOUNTER — Encounter: Payer: Self-pay | Admitting: Internal Medicine

## 2024-03-11 ENCOUNTER — Ambulatory Visit: Attending: Internal Medicine | Admitting: Internal Medicine

## 2024-03-11 VITALS — BP 116/78 | HR 89 | Ht 62.0 in | Wt 215.0 lb

## 2024-03-11 DIAGNOSIS — Z6839 Body mass index (BMI) 39.0-39.9, adult: Secondary | ICD-10-CM | POA: Diagnosis not present

## 2024-03-11 DIAGNOSIS — F321 Major depressive disorder, single episode, moderate: Secondary | ICD-10-CM

## 2024-03-11 DIAGNOSIS — N3946 Mixed incontinence: Secondary | ICD-10-CM

## 2024-03-11 DIAGNOSIS — Z1231 Encounter for screening mammogram for malignant neoplasm of breast: Secondary | ICD-10-CM

## 2024-03-11 DIAGNOSIS — N951 Menopausal and female climacteric states: Secondary | ICD-10-CM

## 2024-03-11 DIAGNOSIS — E66812 Obesity, class 2: Secondary | ICD-10-CM

## 2024-03-11 DIAGNOSIS — I10 Essential (primary) hypertension: Secondary | ICD-10-CM | POA: Diagnosis not present

## 2024-03-11 DIAGNOSIS — N959 Unspecified menopausal and perimenopausal disorder: Secondary | ICD-10-CM

## 2024-03-11 DIAGNOSIS — F411 Generalized anxiety disorder: Secondary | ICD-10-CM

## 2024-03-11 MED ORDER — PHENTERMINE HCL 15 MG PO CAPS
15.0000 mg | ORAL_CAPSULE | Freq: Every day | ORAL | 1 refills | Status: AC
Start: 2024-03-11 — End: ?

## 2024-03-11 MED ORDER — SOLIFENACIN SUCCINATE 5 MG PO TABS: 5.0000 mg | ORAL_TABLET | Freq: Every day | ORAL | 2 refills | Status: DC

## 2024-03-11 MED ORDER — VEOZAH 45 MG PO TABS: 45.0000 mg | ORAL_TABLET | Freq: Every day | ORAL | 2 refills | Status: AC

## 2024-03-11 NOTE — Progress Notes (Signed)
 Patient ID: Michele Lang, female    DOB: 03-22-1976  MRN: [id number]  CC: Hypertension (HTN f/u. Bobie phentermine Janiece to Tdap & pneumonia vax. Yes to mammogram.)   Subjective: Michele Lang is a 48 y.o. female who presents for chronic ds management. Her concerns today include: Patient with history of HTN, RA with positive RA factor, anxiety/depression, anemia, obesity   Night sweats- For the past month, she has been waking up noticing the bottom of her bed gown is wet.  Has felt hot at night for the past 6 months. Keeps 4 fans on at night with the temperature set to 67-75F. Does not wear cotton underwear. The moisture does not smell like urine and she is unsure if it is sweat. LMP was last month, her first since May (periods were regular prior to May). Notes mood swings recently.  Urinary incontinence- Reports stress urinary incontinence with coughing or sneezing for 4 months and urinary urgency for 2 months. Denies bowel incontinence, pelvic pressure, or bulging sensation. No numbness, weakness, or tingling. Has chronic pain from rheumatoid arthritis. Previously tried Kegel exercises, but felt limited by her arthritis to tighten the pelvic muscles. Has had 2 vaginal deliveries. Denies hx of abdominal or GU surgeries.   HTN- BP 116/78. Taking amlodipine  10 mg daily, hydrochlorothiazide  12.5 mg daily. Also takes Benicar  20 mg daily, which was added at the last visit. Not checking BP at home due to a broken cuff. Attempting to limit salt intake by eating fewer chips and crackers.   Obesity- Reports decreased appetite, typically eats 1-2 large meals daily and may forget to eat when stressed. Stopped daily sodas, replaced them with orange juice to reduce sugar intake. Continues to drink Gatorade and hot tea with honey. Is trying to drink more water. Eats muffins in the morning with her medications but has removed her snack stash. Describes herself as a picky eater and finds it  difficult to choose healthy options. Walks up stairs at home daily but does not exercise. Remains interested in phentermine for weight loss since her insurance declined Wegovy . Weight is 261 lbs today, was 262 lbs in September.   Depression/Anxiety- PHQ-9 score 22, up from 18. GAD-7 15, up from 12. Takes Buspar , which helps with anxiety. Reports home stress contributing to poor appetite. Does not want to restart Lexapro. Has a pending counseling appointment in January.  HM Due for Zoster Vaccine Mammogram- has not yet scheduled    Patient Active Problem List   Diagnosis Date Noted   Class 2 severe obesity due to excess calories with serious comorbidity and body mass index (BMI) of 37.0 to 37.9 in adult 09/06/2023   Iron deficiency anemia 01/24/2022   GAD (generalized anxiety disorder) 01/24/2022   Axillary lymphadenopathy 09/06/2020   Rheumatoid arthritis involving multiple sites with positive rheumatoid factor (HCC) 05/12/2015   Chest pain, atypical 11/22/2013   Benign essential hypertension 01/04/2013   Cough 01/04/2013   Pleural effusion 01/03/2013     Current Outpatient Medications on File Prior to Visit  Medication Sig Dispense Refill   acetaminophen  (TYLENOL ) 650 MG CR tablet Take 650 mg by mouth as needed for pain.     albuterol  (VENTOLIN  HFA) 108 (90 Base) MCG/ACT inhaler Inhale 1-2 puffs into the lungs every 6 (six) hours as needed. (Patient not taking: Reported on 03/11/2024) 8 g 0   amLODipine  (NORVASC ) 10 MG tablet Take 1 tablet (10 mg total) by mouth daily. OFFICE VISIT NEEDED FOR ADDITIONAL REFILLS 90 tablet  1   azelastine  (ASTELIN ) 0.1 % nasal spray Place 2 sprays into both nostrils 2 (two) times daily. Use in each nostril as directed 30 mL 12   benzonatate  (TESSALON ) 100 MG capsule Take 1-2 capsules (100-200 mg total) by mouth 3 (three) times daily as needed. 30 capsule 0   EPINEPHrine  0.3 mg/0.3 mL IJ SOAJ injection Inject 0.3 mLs into the muscle as needed.      escitalopram (LEXAPRO) 10 MG tablet Take by mouth.     fluticasone  (FLONASE ) 50 MCG/ACT nasal spray Place 2 sprays into both nostrils in the morning and at bedtime. 16 g 6   hydrochlorothiazide  (HYDRODIURIL ) 12.5 MG tablet Take 1 tablet (12.5 mg total) by mouth daily. 90 tablet 3   ibuprofen  (ADVIL ) 600 MG tablet Take 1 tablet (600 mg total) by mouth every 8 (eight) hours as needed. 21 tablet 0   olmesartan  (BENICAR ) 20 MG tablet Take 1 tablet (20 mg total) by mouth daily. 30 tablet 3   ORENCIA CLICKJECT 125 MG/ML SOAJ Inject 125 mg into the skin once a week.     OVER THE COUNTER MEDICATION B 12 injection, one injection per month.     potassium chloride  (KLOR-CON  M) 10 MEQ tablet Take 1 tablet (10 mEq total) by mouth daily. (Patient not taking: Reported on 03/11/2024) 30 tablet 6   SUMAtriptan (IMITREX) 50 MG tablet Take by mouth. (Patient not taking: Reported on 03/11/2024)     No current facility-administered medications on file prior to visit.    Allergies  Allergen Reactions   Infliximab  Anaphylaxis   Cozaar  [Losartan  Potassium] Other (See Comments)    Caused CP   Penicillins Rash    Has patient had a PCN reaction causing immediate rash, facial/tongue/throat swelling, SOB or lightheadedness with hypotension: Yes  Has patient had a PCN reaction causing severe rash involving mucus membranes or skin necrosis: Yes  Has patient had a PCN reaction that required hospitalization No  Has patient had a PCN reaction occurring within the last 10 years: No  If all of the above answers are NO, then may proceed with Cephalosporin use.  Has patient had a PCN reaction causing immediate rash, facial/tongue/throat swelling, SOB or lightheadedness with hypotension: Yes, Has patient had a PCN reaction causing severe rash involving mucus membranes or skin necrosis: Yes, Has patient had a PCN reaction that required hospitalization No, Has patient had a PCN reaction occurring within the last 10 years: No,  If all of the above answers are NO, then may proceed with Cephalosporin use.    Social History   Socioeconomic History   Marital status: Single    Spouse name: Not on file   Number of children: Not on file   Years of education: Not on file   Highest education level: Some college, no degree  Occupational History   Not on file  Tobacco Use   Smoking status: Never   Smokeless tobacco: Never  Vaping Use   Vaping status: Never Used  Substance and Sexual Activity   Alcohol use: No   Drug use: No   Sexual activity: Never  Other Topics Concern   Not on file  Social History Narrative   Not on file   Social Drivers of Health   Financial Resource Strain: High Risk (09/06/2023)   Overall Financial Resource Strain (CARDIA)    Difficulty of Paying Living Expenses: Hard  Food Insecurity: Food Insecurity Present (09/06/2023)   Hunger Vital Sign    Worried About Running Out of  Food in the Last Year: Never true    Ran Out of Food in the Last Year: Sometimes true  Transportation Needs: Unmet Transportation Needs (09/06/2023)   PRAPARE - Transportation    Lack of Transportation (Medical): Yes    Lack of Transportation (Non-Medical): Yes  Physical Activity: Inactive (09/06/2023)   Exercise Vital Sign    Days of Exercise per Week: 0 days    Minutes of Exercise per Session: 0 min  Stress: No Stress Concern Present (09/06/2023)   [employer] of Occupational Health - Occupational Stress Questionnaire    Feeling of Stress : Only a little  Recent Concern: Stress - Stress Concern Present (07/03/2023)   [employer] of Occupational Health - Occupational Stress Questionnaire    Feeling of Stress : Rather much  Social Connections: Socially Isolated (09/06/2023)   Social Connection and Isolation Panel    Frequency of Communication with Friends and Family: Once a week    Frequency of Social Gatherings with Friends and Family: Once a week    Attends Religious Services: Never    Automotive engineer or Organizations: No    Attends Banker Meetings: Never    Marital Status: Never married  Intimate Partner Violence: Not At Risk (07/03/2023)   Humiliation, Afraid, Rape, and Kick questionnaire    Fear of Current or Ex-Partner: No    Emotionally Abused: No    Physically Abused: No    Sexually Abused: No    Family History  Problem Relation Age of Onset   Hypertension Mother    Heart disease Mother    Hypertension Father    Hypertension Sister    Depression Maternal Uncle    Depression Maternal Grandmother    Rheum arthritis Maternal Grandmother    Asthma Son    Allergies Son    Colon cancer Neg Hx    Esophageal cancer Neg Hx    Rectal cancer Neg Hx    Stomach cancer Neg Hx     Past Surgical History:  Procedure Laterality Date   BREAST SURGERY     FOOT SURGERY      ROS: Review of Systems Negative except as stated above  PHYSICAL EXAM: BP 116/78 (BP Location: Left Arm, Patient Position: Sitting, Cuff Size: Normal)   Pulse 89   Ht 5' 2 (1.575 m)   Wt 97.5 kg   SpO2 98%   BMI 39.32 kg/m   Physical Exam  General appearance - alert, well appearing obese AAF in no distress Mental status - normal mood, behavior, speech, dress, motor activity, and thought processes Chest - clear to auscultation, no wheezes, rales or rhonchi, symmetric air entry Heart - normal rate, regular rhythm, normal S1, S2, no murmurs, rubs, clicks or gallops Extremities - no pedal edema  Neuro-  motor and sensory grossly intact in b/l LE. 4/5 quadriceps strength, 5/5 otherwise in LE. 2+ patellar reflexes. Pt has a slow, careful gait.      Latest Ref Rng & Units 02/06/2023    3:59 PM 08/15/2020    1:09 PM 08/01/2018    5:20 PM  CMP  Glucose 70 - 99 mg/dL 885  85  894   BUN 6 - 20 mg/dL 14  8  15    Creatinine 0.44 - 1.00 mg/dL 9.20  9.40  9.25   Sodium 135 - 145 mmol/L 140  133  134   Potassium 3.5 - 5.1 mmol/L 3.6  3.7  3.8   Chloride 98 - 111 mmol/L  101  102  102    CO2 22 - 32 mmol/L 24  24  25    Calcium 8.9 - 10.3 mg/dL 9.0  8.5  8.9   Total Protein 6.5 - 8.1 g/dL   7.8   Total Bilirubin 0.3 - 1.2 mg/dL   0.5   Alkaline Phos 38 - 126 U/L   58   AST 15 - 41 U/L   17   ALT 0 - 44 U/L   13    Lipid Panel  No results found for: CHOL, TRIG, HDL, CHOLHDL, VLDL, LDLCALC, LDLDIRECT  CBC    Component Value Date/Time   WBC 6.1 02/06/2023 1559   RBC 4.85 02/06/2023 1559   HGB 11.3 (L) 02/06/2023 1559   HGB 9.9 (L) 01/24/2022 1504   HCT 37.8 02/06/2023 1559   HCT 33.7 (L) 01/24/2022 1504   PLT 289 02/06/2023 1559   PLT 332 01/24/2022 1504   MCV 77.9 (L) 02/06/2023 1559   MCV 73 (L) 01/24/2022 1504   MCH 23.3 (L) 02/06/2023 1559   MCHC 29.9 (L) 02/06/2023 1559   RDW 15.5 02/06/2023 1559   RDW 19.1 (H) 01/24/2022 1504   LYMPHSABS 1.7 07/25/2018 1921   MONOABS 0.0 (L) 07/25/2018 1921   EOSABS 0.2 07/25/2018 1921   BASOSABS 0.1 07/25/2018 1921      03/11/2024    4:16 PM 01/25/2024    2:10 PM 09/06/2023    2:37 PM 07/03/2023   11:31 AM 06/21/2023    5:26 PM  Depression screen PHQ 2/9  Decreased Interest 3 3 1 3 2   Down, Depressed, Hopeless 3 2 1 3 2   PHQ - 2 Score 6 5 2 6 4   Altered sleeping 3 3 0 1 2  Tired, decreased energy 3 3 1  0 2  Change in appetite 3 2 0 0 2  Feeling bad or failure about yourself  3 2 0 0 2  Trouble concentrating 3 3 1  0 1  Moving slowly or fidgety/restless 1 0 0 0 0  Suicidal thoughts 0 0 0 0 0  PHQ-9 Score 22 18 4 7 13   Difficult doing work/chores Very difficult Somewhat difficult Somewhat difficult        03/11/2024    4:16 PM 01/25/2024    2:10 PM 06/21/2023    5:26 PM 05/24/2023   11:46 AM  GAD 7 : Generalized Anxiety Score  Nervous, Anxious, on Edge 3 2 1 3   Control/stop worrying 3 2 2 3   Worry too much - different things 3 2 1 3   Trouble relaxing 2 2 1 2   Restless 1 1 0 0  Easily annoyed or irritable 2 2 1 2   Afraid - awful might happen 1 1 1 1   Total GAD 7 Score 15 12 7 14   Anxiety  Difficulty Very difficult Very difficult        ASSESSMENT AND PLAN:  Assessment and Plan  1. Essential hypertension (Primary) At goal. Continue amlodipine  10 mg daily, hydrochlorothiazide  12.5 mg daily, and Benicar  20 mg daily.   2. Class 2 severe obesity due to excess calories with serious comorbidity and body mass index (BMI) of 39.0 to 39.9 in adult Weight stable. She continues to consume many sugary drink throughout the day and eat processed food. Informed her that orange juice and soda have the same sugar content. Advised diet sodas as a substitute. Encouraged walking 30 minutes a day 2-3x a week as a start. Will send her to nutrition for more  assistance on healthy replacement options to which she is agreeable. Since her BP is at goal today, we can start her on Phentermine 15mg  daily for assistance with weight loss. If this is well tolerated we may increase it to 30 mg at next visit. Informed her that Phentermine may cause palpitations, and to stop the mediciation if this happens and let me know.  - Amb ref to Medical Nutrition Therapy-MNT - phentermine 15 MG capsule; Take 1 capsule (15 mg total) by mouth daily.  Dispense: 30 capsule; Refill: 1  3. Mixed stress and urge urinary incontinence Sx consistent with mixed urinary incontinence, likely related to body habitus and hx of vaginal deliveries. She has already tried Kegel exercises so we will start her on Vesicare 5 mg daily. Told her stop taking it if she is unable to urinate and to let me know. Recommended cotton underwear. - solifenacin (VESICARE) 5 MG tablet; Take 1 tablet (5 mg total) by mouth daily.  Dispense: 30 tablet; Refill: 2  4. Major depressive disorder, single episode, moderate (HCC) PHQ-9 has increased and she endorses stress. Denies self harm. Pt declines restarting medication for depression and will continue with her appointment with [facility] in January.  5. GAD (generalized anxiety disorder) Worsening anxiety but she  finds Buspar  helpful for now. See #4 as above. - Continue Buspar  PRN for anxiety.   6. Perimenopausal Based on her age, decreased frequency of menstrual periods, mood swings, and recent night sweats she is most likely perimenopausal. Will order hormone levels. We agreed to start her on Veozah for hot flashes. Pt informed of the need for monitor of LFTs while on this medication. Informed that it can sometimes cause headaches. - Fezolinetant (VEOZAH) 45 MG TABS; Take 1 tablet (45 mg total) by mouth daily.  Dispense: 60 tablet; Refill: 2 - FSH/LH; Future - Hepatic function panel; Future  7. Screening mammogram for breast cancer Provided her the number for [facility] imaging [phone]) so she can schedule her mammogram.  8. HM Had planned to get Shingrix vaccine today but it flagged that there may be an interaction with Infliximab  so we will check with the clinical pharmacist first.    Patient was given the opportunity to ask questions.  Patient verbalized understanding of the plan and was able to repeat key elements of the plan.     Orders Placed This Encounter  Procedures   FSH/LH   Hepatic function panel   Amb ref to Medical Nutrition Therapy-MNT     Requested Prescriptions   Signed Prescriptions Disp Refills   solifenacin (VESICARE) 5 MG tablet 30 tablet 2    Sig: Take 1 tablet (5 mg total) by mouth daily.   Fezolinetant (VEOZAH) 45 MG TABS 60 tablet 2    Sig: Take 1 tablet (45 mg total) by mouth daily.   phentermine 15 MG capsule 30 capsule 1    Sig: Take 1 capsule (15 mg total) by mouth daily.    Return in about 2 months (around 05/11/2024).  This is a Psychologist, occupational Note.  The care of the patient was discussed with Dr. Vicci and the assessment and plan formulated with her assistance.  Please see her attestation of this encounter.  Duwaine Amber, MS3 [employer]   Evaluation and management procedures were performed by me with Medical Student in attendance, note written by  Medical Student under my supervision and collaboration. I have reviewed the note and I agree with the management and plan.   Barnie Vicci, MD, FAAFP.  [facility] [place], [place] [phone]   03/11/2024, 5:59 PM

## 2024-03-11 NOTE — Patient Instructions (Addendum)
 Please call to schedule your mammogram at [facility] imaging: [phone]  We will start you on Phentermine 15 mg to assist with weight loss, if this is well tolerated we may increase it. It can cause palpitations or heart racing, if you experience this stop the medication and let me know via MyChart.  We have started you on Vesicare for urinary incontinence. If you are unable to pee while taking this please stop it and give me a call.   Wear cotton underwear, it is a more breathable material.

## 2024-04-08 ENCOUNTER — Other Ambulatory Visit: Payer: Self-pay | Admitting: Internal Medicine

## 2024-04-08 DIAGNOSIS — I10 Essential (primary) hypertension: Secondary | ICD-10-CM

## 2024-05-01 ENCOUNTER — Encounter (HOSPITAL_COMMUNITY): Payer: Self-pay | Admitting: Clinical

## 2024-05-01 ENCOUNTER — Encounter (HOSPITAL_COMMUNITY): Payer: Self-pay

## 2024-05-01 ENCOUNTER — Ambulatory Visit (INDEPENDENT_AMBULATORY_CARE_PROVIDER_SITE_OTHER): Admitting: Clinical

## 2024-05-01 DIAGNOSIS — F411 Generalized anxiety disorder: Secondary | ICD-10-CM

## 2024-05-01 DIAGNOSIS — F410 Panic disorder [episodic paroxysmal anxiety] without agoraphobia: Secondary | ICD-10-CM

## 2024-05-01 DIAGNOSIS — F331 Major depressive disorder, recurrent, moderate: Secondary | ICD-10-CM
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
## 2024-05-06 ENCOUNTER — Inpatient Hospital Stay (HOSPITAL_BASED_OUTPATIENT_CLINIC_OR_DEPARTMENT_OTHER): Admission: RE | Admit: 2024-05-06 | Discharge: 2024-05-06 | Attending: Internal Medicine | Admitting: Internal Medicine

## 2024-05-06 ENCOUNTER — Encounter (HOSPITAL_BASED_OUTPATIENT_CLINIC_OR_DEPARTMENT_OTHER): Payer: Self-pay

## 2024-05-06 ENCOUNTER — Ambulatory Visit (HOSPITAL_BASED_OUTPATIENT_CLINIC_OR_DEPARTMENT_OTHER)
Admission: RE | Admit: 2024-05-06 | Discharge: 2024-05-06 | Disposition: A | Source: Ambulatory Visit | Attending: Internal Medicine | Admitting: Internal Medicine

## 2024-05-06 DIAGNOSIS — Z13820 Encounter for screening for osteoporosis: Secondary | ICD-10-CM | POA: Insufficient documentation

## 2024-05-06 DIAGNOSIS — Z1231 Encounter for screening mammogram for malignant neoplasm of breast: Secondary | ICD-10-CM | POA: Diagnosis present

## 2024-05-06 DIAGNOSIS — Z780 Asymptomatic menopausal state: Secondary | ICD-10-CM | POA: Diagnosis not present

## 2024-05-07 ENCOUNTER — Ambulatory Visit: Payer: Self-pay | Admitting: Internal Medicine

## 2024-05-09 NOTE — Telephone Encounter (Signed)
 Copied from CRM #[id number]. Topic: Clinical - Lab/Test Results >> May 09, 2024  4:42 PM Tonda B wrote: Reason for CRM: patient is calling about test results please give pt call back [phone]

## 2024-05-14 ENCOUNTER — Other Ambulatory Visit: Payer: Self-pay | Admitting: Internal Medicine

## 2024-05-14 DIAGNOSIS — I10 Essential (primary) hypertension: Secondary | ICD-10-CM

## 2024-05-20 ENCOUNTER — Encounter (HOSPITAL_COMMUNITY): Payer: Self-pay | Admitting: Clinical

## 2024-05-20 ENCOUNTER — Ambulatory Visit (HOSPITAL_COMMUNITY): Admitting: Clinical

## 2024-05-20 DIAGNOSIS — F331 Major depressive disorder, recurrent, moderate: Secondary | ICD-10-CM

## 2024-05-20 DIAGNOSIS — F411 Generalized anxiety disorder: Secondary | ICD-10-CM | POA: Diagnosis not present

## 2024-05-20 DIAGNOSIS — F410 Panic disorder [episodic paroxysmal anxiety] without agoraphobia: Secondary | ICD-10-CM | POA: Diagnosis not present

## 2024-05-20 NOTE — Progress Notes (Signed)
 "  THERAPIST PROGRESS NOTE  Session Time: 10:00am-10:44am  Session #2  Virtual Visit via Video Note  I connected with Michele Lang on 05/20/2024 at 10:00 AM EST by a video enabled telemedicine application and verified that I am speaking with the correct person using two identifiers.  Location: Patient: home Provider: home office   I discussed the limitations of evaluation and management by telemedicine and the availability of in person appointments. The patient expressed understanding and agreed to proceed.   I discussed the assessment and treatment plan with the patient. The patient was provided an opportunity to ask questions and all were answered. The patient agreed with the plan and demonstrated an understanding of the instructions.   The patient was advised to call back or seek an in-person evaluation if the symptoms worsen or if the condition fails to improve as anticipated.  I provided 44 minutes of non-face-to-face time during this encounter.  We were then suddenly disconnected.  Michele JINNY Crest, LCSW   Participation Level: Active  Behavioral Response: Casual Alert Negative  Type of Therapy: Individual Therapy  Treatment Goals addressed:  OUH:Ozjmw & practice 5+ communication techniques such as active listening, I statements, open-ended questions, fair fighting rules; learn about 3 boundary styles and 6 types, plus how to decide, tell other party, implement, and enforce them.   STG: Learn a variety of breathing techniques and grounding strategies, practice in session then report independent application out of session 2-4 times per month or more often, if needed.   LTG: Learn and practice 5-7 communication techniques such as active listening, I statements, open-ended questions, reflective listening, assertiveness, fair fighting rules, initiating conversations, and more as necessary and taught in session.    STG: Score less than 9 on the PHQ-9 and less  than 5 on the GAD-7 as evidenced by intermittent administration of the questionnaires to determine progress in managing depression and anxiety.    LTG: Process life events to the extent needed so that will be able to move forward with various areas of life in a better frame of mind per self-report of improved satisfaction with life 5 out of 7 days over the next 6 months.    STG: Learn 10+ coping skills from CBT, Stages of Change, DBT, shame resilience theory, ACT, SFBT, MI, trauma-informed therapy, ERP, IFS, and forgiveness curriculum to be able to manage mental health symptoms, AEB home practice and reporting back.   LTG: Improve self-esteem by engaging in daily affirmations, developing new skills, gratitude journaling, use of SMART goals, increased assertiveness, challenging negative beliefs, and focusing on what patient can control.  STG: Learn what detachment is, the value of detaching from loved ones who are acting in a manner that is counter to our own values, and how to go about detaching; identify who and how detachment will be pursued then report success 3 days out of 7.  ProgressTowards Goals: Progressing  Interventions: Conservator, Museum/gallery, Supportive, and Other: Communication skills  Summary: Michele Lang is a 48 y.o. female who presents with a diagnosis of Generalized Anxiety Disorder, past panic attacks, unspecified depression, on disability for Rheumatoid Arthritis who presents for therapy.  She presented oriented x5 and stated she was feeling okay, the holidays went by pretty fast but I didn't feel very Christmas-sy.  CSW evaluated patient's medication compliance, use of coping tools, and self-care, as applicable.  She provided an update on various aspects of her life that are normally discussed in therapy, including ongoing issues with her  son and his girlfriend who remain in the home, taking up patient's living room area and not appearing to make effort toward securing their  own place to move into.  They leave the baby's toys all over the floor and in front of the entry doorway, which makes the patient fear she will be tripped up and fall.  They are not contributing financially or helping with chores.  Yesterday there was a talk about her 2 dogs because the adult children do not want their baby to be licked by the dogs.  She expressed resentment that they knew when they asked to move in that she had the dogs.  She identified boundaries and communication as the main items to work on for this situation, which was consistent with her requests at the time of CCA.  CSW shared screen with her and provided psychoeducation about boundary styles, boundary types, and I statements.  CSW provided a pictorial handout and psychoeducation about types of boundaries, including porous, rigid, and healthy.  We discussed how boundaries define what is acceptable and unacceptable within a relationship, with an emphasis that boundaries are present whether we have spoken aloud about them or not.  Porous boundaries were described as letting everything in whether it is healthy or unhealthy, not saying no when we want to, oversharing, avoiding conflict by giving in, trusting too much and without reason to do so, and communicating passively.  Patient reported she has engaged in this type of boundary frequently.  Rigid boundaries were described as not letting anything in regardless of whether it is healthy or unhealthy, saying no most of the time regardless of circumstance, avoiding conflict by pushing others away, being inflexible, not trusting even those who have not shown reason to distrust, and communicating aggressively.  Patient reported she does now engage in this type of boundary frequently.  Healthy boundaries were described as letting in the health and not letting in the unhealthy, taking time to build trust with other people, saying no when it is needed, accepting conflict as a normal part of  life, sharing personal information appropriately, and communicating assertively.  Patient reported she does not engage in this type of boundary frequently and would benefit by doing so, learning to do so more.  CSW emphasized that if we wish to change the current boundaries in a relationship, it is only fair to tell the other person what the new expectations are so that they have the opportunity to choose to respect them, if they wish, which would enable us  to then experience positive growth in that relationship.  Patient verbalized full understanding of concepts about boundaries as presented.  Handouts were emailed to her after email address was confirmed.   Patient also said she misses her sister who did not come by the house on Christmas because of wanting to stay away from any drama caused by patient's son and his girlfriend.  Mother is still in a nursing home, is currently on a trach because she could not breathe, and they are trying to decide whether to remove it.  Much support and empathy was provided.  Suicidal/Homicidal: No without intent/plan  Therapist Response: Patient is progressing AEB engaging in scheduled therapy session.  Throughout the session, CSW gave patient the opportunity to explore thoughts and feelings associated with current life situations and past/present stressors.   CSW challenged patient gently and appropriately to consider different ways of looking at reported issues. CSW encouraged patients expression of feelings and validated these using empathy,  active listening, open body language, and unconditional positive regard.     Plan/Recommendations:  Return to therapy in 4 weeks to next scheduled appointment on 1/28, reflect on what was discussed in session, engage in self care behaviors as explored in session, do homework as assigned (think about what boundaries to set with son, practice ways to say it using I statements, follow through), and return to next session prepared to  talk about experience with new coping methods.   Diagnosis:  Moderate episode of recurrent major depressive disorder (HCC)  Generalized anxiety disorder with panic attacks  Collaboration of Care: Primary Care Provider AEB - doctors involved in patient's care can see that she is in therapy, cannot read notes  Patient/Guardian was advised Release of Information must be obtained prior to any record release in order to collaborate their care with an outside provider. Patient/Guardian was advised if they have not already done so to contact the registration department to sign all necessary forms in order for us  to release information regarding their care.   Consent: Patient/Guardian gives verbal consent for treatment and assignment of benefits for services provided during this visit. Patient/Guardian expressed understanding and agreed to proceed.   Michele JINNY Crest, LCSW 05/20/2024  "

## 2024-05-23 ENCOUNTER — Encounter: Payer: Self-pay | Admitting: Internal Medicine

## 2024-05-23 ENCOUNTER — Telehealth: Payer: Self-pay | Admitting: Internal Medicine

## 2024-05-23 ENCOUNTER — Ambulatory Visit: Attending: Internal Medicine | Admitting: Internal Medicine

## 2024-05-23 VITALS — BP 111/74 | HR 85 | Temp 98.2°F | Ht 62.0 in | Wt 215.0 lb

## 2024-05-23 DIAGNOSIS — Z6839 Body mass index (BMI) 39.0-39.9, adult: Secondary | ICD-10-CM | POA: Diagnosis not present

## 2024-05-23 DIAGNOSIS — H1013 Acute atopic conjunctivitis, bilateral: Secondary | ICD-10-CM | POA: Diagnosis not present

## 2024-05-23 DIAGNOSIS — N3946 Mixed incontinence: Secondary | ICD-10-CM | POA: Diagnosis not present

## 2024-05-23 DIAGNOSIS — E66812 Obesity, class 2: Secondary | ICD-10-CM

## 2024-05-23 DIAGNOSIS — N951 Menopausal and female climacteric states: Secondary | ICD-10-CM | POA: Diagnosis not present

## 2024-05-23 DIAGNOSIS — Z23 Encounter for immunization: Secondary | ICD-10-CM

## 2024-05-23 DIAGNOSIS — D509 Iron deficiency anemia, unspecified: Secondary | ICD-10-CM | POA: Diagnosis not present

## 2024-05-23 DIAGNOSIS — F321 Major depressive disorder, single episode, moderate: Secondary | ICD-10-CM | POA: Diagnosis not present

## 2024-05-23 DIAGNOSIS — M0579 Rheumatoid arthritis with rheumatoid factor of multiple sites without organ or systems involvement: Secondary | ICD-10-CM

## 2024-05-23 MED ORDER — OLOPATADINE HCL 0.1 % OP SOLN: 1.0000 [drp] | Freq: Two times a day (BID) | OPHTHALMIC | 0 refills | Status: AC

## 2024-05-23 MED ORDER — SOLIFENACIN SUCCINATE 10 MG PO TABS: 10.0000 mg | ORAL_TABLET | Freq: Every day | ORAL | 4 refills | Status: AC

## 2024-05-23 NOTE — Telephone Encounter (Signed)
 Copied from CRM #[id number]. Topic: Clinical - Medical Advice >> May 23, 2024 12:29 PM Wess RAMAN wrote: Reason for CRM: Patient would like to know if Allergic conjunctivitis of both eyes is contagious or just an allergy  Callback #: [id number]

## 2024-05-23 NOTE — Progress Notes (Signed)
 "   Patient ID: Michele Lang, female    DOB: 27-Apr-1976  MRN: [id number]  CC: Hypertension (HTN f/u./Left eye drainage, crustiness X Thompson weight loss options /Pneuonia & Tdap vax administered on 05/23/2024 - C.A.)   Subjective: Michele Lang is a 49 y.o. female who presents for chronic ds management. Her chronic med conditions include:  Patient with history of HTN, RA with positive RA factor, anxiety/depression, anemia, obesity   Discussed the use of AI scribe software for clinical note transcription with the patient, who gave verbal consent to proceed.  History of Present Illness Michele Lang is a 49 year old female with depression and rheumatoid arthritis who presents for follow-up on weight management and acute left eye symptoms.  Obesity: She was started on phentermine  15 mg daily on last visit 2 mths ago. Reports she did not receive the med from her pharmacy and not sure why.  Her weight remains stable at 215 pounds. She has made dietary changes, including reducing high sodium foods and sugary drinks, and increasing water intake. She did not schedule a nutritionist appointment due to out-of-pocket costs. She tries not to stay in bed or sit around all day but struggles with soreness from her RA.  She reports acute symptoms in her left eye, including itching and increased drainage over the past two to three days, with crusting around the eyelid in the morning and some blurred vision. The right eye has also started to show crusting today, but without significant itching. No light sensitivity is reported.  RA: Regarding her rheumatoid arthritis, she recently saw her rheumatologist who changed her medication from Orencia to sulfasalazine 500 mg twice a day due to insurance issues. She is finishing her remaining Orencia shots. She continues to take hydroxychloroquine 200 mg and prednisone  5 mg daily.   Mix Incontinence: For her urinary incontinence, she was started on  VESIcare  5 mg but has missed two doses. She feels it may have helped a little but is unsure due to her mobility issues. She experiences urgency and difficulty reaching the bathroom in time.  MDD: She has a history of depression and has opted for therapy over medication. She started sessions with LCSW Lydia Friedlander since last visit with me 2 mths ago and finds them helpful. No thoughts of self-harm.  IDA/B12 def: Her anemia is stable with a hemoglobin of 10.8. She previously received iron infusions through [facility] hematology, the last being six months ago, and plans to follow up for further treatment.   Hot flashes: Started on Veozah  when last visit.  She is not sure whether she got this medicine from the pharmacy or not.    Patient Active Problem List   Diagnosis Date Noted   Major depressive disorder, single episode, moderate (HCC) 05/23/2024   Morbid (severe) obesity due to excess calories (HCC) 05/23/2024   Class 2 severe obesity due to excess calories with serious comorbidity and body mass index (BMI) of 37.0 to 37.9 in adult 09/06/2023   Iron deficiency anemia 01/24/2022   GAD (generalized anxiety disorder) 01/24/2022   Axillary lymphadenopathy 09/06/2020   Rheumatoid arthritis involving multiple sites with positive rheumatoid factor (HCC) 05/12/2015   Chest pain, atypical 11/22/2013   Benign essential hypertension 01/04/2013   Cough 01/04/2013   Pleural effusion 01/03/2013     Medications Ordered Prior to Encounter[1]  Allergies[2]  Social History   Socioeconomic History   Marital status: Single    Spouse name: Not on file   Number of  children: Not on file   Years of education: Not on file   Highest education level: Some college, no degree  Occupational History   Not on file  Tobacco Use   Smoking status: Never   Smokeless tobacco: Never  Vaping Use   Vaping status: Never Used  Substance and Sexual Activity   Alcohol use: No   Drug use: No   Sexual activity: Never   Other Topics Concern   Not on file  Social History Narrative   Not on file   Social Drivers of Health   Tobacco Use: Low Risk (05/20/2024)   Patient History    Smoking Tobacco Use: Never    Smokeless Tobacco Use: Never    Passive Exposure: Not on file  Financial Resource Strain: High Risk (09/06/2023)   Overall Financial Resource Strain (CARDIA)    Difficulty of Paying Living Expenses: Hard  Food Insecurity: Medium Risk (05/03/2024)   Received from [facility]   Epic    Within the past 12 months, you worried that your food would run out before you got money to buy more: Sometimes true    Within the past 12 months, the food you bought just didn't last and you didn't have money to get more. : Sometimes true  Transportation Needs: Unmet Transportation Needs (05/03/2024)   Received from [facility]    In the past 12 months, has lack of reliable transportation kept you from medical appointments, meetings, work or from getting things needed for daily living? : Yes  Physical Activity: Inactive (09/06/2023)   Exercise Vital Sign    Days of Exercise per Week: 0 days    Minutes of Exercise per Session: 0 min  Stress: No Stress Concern Present (09/06/2023)   [employer] of Occupational [employer] - Occupational Stress Questionnaire    Feeling of Stress : Only a little  Recent Concern: Stress - Stress Concern Present (07/03/2023)   [employer]ational [employer] - Occupational Stress Questionnaire    Feeling of Stress : Rather much  Social Connections: Socially Isolated (09/06/2023)   Social Connection and Isolation Panel    Frequency of Communication with Friends and Family: Once a week    Frequency of Social Gatherings with Friends and Family: Once a week    Attends Religious Services: Never    Database Administrator or Organizations: No    Attends Banker Meetings: Never    Marital Status: Never married  Intimate Partner Violence: Not At  Risk (07/03/2023)   Humiliation, Afraid, Rape, and Kick questionnaire    Fear of Current or Ex-Partner: No    Emotionally Abused: No    Physically Abused: No    Sexually Abused: No  Depression (PHQ2-9): High Risk (05/01/2024)   Depression (PHQ2-9)    PHQ-2 Score: 17  Alcohol Screen: Low Risk (09/06/2023)   Alcohol Screen    Last Alcohol Screening Score (AUDIT): 0  Housing: Medium Risk (05/03/2024)   Received from [facility]   Epic    What is your living situation today?: I have a steady place to live    Think about the place you live. Do you have problems with any of the following? Choose all that apply:: Smoke detectors missing or not working  Utilities: Medium Risk (05/03/2024)   Received from [facility]   Utilities    In the past 12 months has the electric, gas, oil, or water company threatened to shut off services in your home? :  Yes  Health Literacy: Adequate Health Literacy (07/03/2023)   B1300 Health Literacy    Frequency of need for help with medical instructions: Never    Family History  Problem Relation Age of Onset   Hypertension Mother    Heart disease Mother    Hypertension Father    Hypertension Sister    Depression Maternal Uncle    Depression Maternal Grandmother    Rheum arthritis Maternal Grandmother    Asthma Son    Allergies Son    Colon cancer Neg Hx    Esophageal cancer Neg Hx    Rectal cancer Neg Hx    Stomach cancer Neg Hx     Past Surgical History:  Procedure Laterality Date   BREAST SURGERY     FOOT SURGERY      ROS: Review of Systems Negative except as stated above  PHYSICAL EXAM: BP 111/74 (BP Location: Left Arm, Patient Position: Sitting, Cuff Size: Large)   Pulse 85   Temp 98.2 F (36.8 C) (Oral)   Ht 5' 2 (1.575 m)   Wt 215 lb (97.5 kg)   LMP 04/10/2024 (Approximate)   SpO2 96%   BMI 39.32 kg/m   Wt Readings from Last 3 Encounters:  05/23/24 215 lb (97.5 kg)  03/11/24 215 lb (97.5 kg)  01/25/24 216 lb (98 kg)     Physical Exam  General appearance -middle-age obese African-American female who appears chronically ill in NAD Mental status - normal mood, behavior, speech, dress and thought processes Eyes -patient has mild crusting of both eyelids.  Mild conjunctival injection bilaterally.  Pupils are equal and reactive.  No photophobia. Chest - clear to auscultation, no wheezes, rales or rhonchi, symmetric air entry Heart - normal rate, regular rhythm, normal S1, S2, no murmurs, rubs, clicks or gallops Musculoskeletal -gait is slow and left knee is mildly flexed when walking.  She ambulates with a cane. Extremities -no lower extremity edema.     05/01/2024    3:04 PM 03/11/2024    4:16 PM 01/25/2024    2:10 PM  Depression screen PHQ 2/9  Decreased Interest  3 3  Down, Depressed, Hopeless  3 2  PHQ - 2 Score  6 5  Altered sleeping  3 3  Tired, decreased energy  3 3  Change in appetite  3 2  Feeling bad or failure about yourself   3 2  Trouble concentrating  3 3  Moving slowly or fidgety/restless  1 0  Suicidal thoughts  0 0  PHQ-9 Score  22  18   Difficult doing work/chores  Very difficult Somewhat difficult     Information is confidential and restricted. Go to Review Flowsheets to unlock data.   Data saved with a previous flowsheet row definition       Latest Ref Rng & Units 02/06/2023    3:59 PM 08/15/2020    1:09 PM 08/01/2018    5:20 PM  CMP  Glucose 70 - 99 mg/dL 885  85  894   BUN 6 - 20 mg/dL 14  8  15    Creatinine 0.44 - 1.00 mg/dL 9.20  9.40  9.25   Sodium 135 - 145 mmol/L 140  133  134   Potassium 3.5 - 5.1 mmol/L 3.6  3.7  3.8   Chloride 98 - 111 mmol/L 101  102  102   CO2 22 - 32 mmol/L 24  24  25    Calcium 8.9 - 10.3 mg/dL 9.0  8.5  8.9  Total Protein 6.5 - 8.1 g/dL   7.8   Total Bilirubin 0.3 - 1.2 mg/dL   0.5   Alkaline Phos 38 - 126 U/L   58   AST 15 - 41 U/L   17   ALT 0 - 44 U/L   13    Lipid Panel  No results found for: CHOL, TRIG, HDL, CHOLHDL,  VLDL, LDLCALC, LDLDIRECT  CBC    Component Value Date/Time   WBC 6.1 02/06/2023 1559   RBC 4.85 02/06/2023 1559   HGB 11.3 (L) 02/06/2023 1559   HGB 9.9 (L) 01/24/2022 1504   HCT 37.8 02/06/2023 1559   HCT 33.7 (L) 01/24/2022 1504   PLT 289 02/06/2023 1559   PLT 332 01/24/2022 1504   MCV 77.9 (L) 02/06/2023 1559   MCV 73 (L) 01/24/2022 1504   MCH 23.3 (L) 02/06/2023 1559   MCHC 29.9 (L) 02/06/2023 1559   RDW 15.5 02/06/2023 1559   RDW 19.1 (H) 01/24/2022 1504   LYMPHSABS 1.7 07/25/2018 1921   MONOABS 0.0 (L) 07/25/2018 1921   EOSABS 0.2 07/25/2018 1921   BASOSABS 0.1 07/25/2018 1921    ASSESSMENT AND PLAN: 1. Class 2 severe obesity due to excess calories with serious comorbidity and body mass index (BMI) of 39.0 to 39.9 in adult (Primary) Commended her on dietary changes she has made so far. Encouraged her to check with her pharmacy about the phentermine  15 mg that was prescribed on last visit.  Let me know if she needs prior approval.  2. Allergic conjunctivitis of both eyes Patanol eyedrop prescribed. Advised to use warm compresses to both eyes twice a day for 5 minutes for the next several days - olopatadine (PATANOL) 0.1 % ophthalmic solution; Place 1 drop into both eyes 2 (two) times daily.  Dispense: 5 mL; Refill: 0  3. Mixed stress and urge urinary incontinence We discussed the Vesicare .  She feels it has helped a little.  We agreed to increase the dose to 10 mg daily. - solifenacin  (VESICARE ) 10 MG tablet; Take 1 tablet (10 mg total) by mouth daily.  Dispense: 30 tablet; Refill: 4  4. Menopausal syndrome (hot flashes) Check with her pharmacy about the Veozah .  Let me know if prior approval is needed.  5. Major depressive disorder, single episode, moderate (HCC) She remains off Lexapro.  Feels better with doing CBT/counseling  6. Rheumatoid arthritis involving multiple sites with positive rheumatoid factor [facility]) Patient continues to follow with rheumatology  through [facility].  Medication list updated.  She will no longer be on Orencia but is now on sulfasalazine instead with prednisone  and Plaquenil.  7. Iron deficiency anemia, unspecified iron deficiency anemia type Hemoglobin is stable around 10.  She will check with her hematologist through [facility] for follow-up.  She was getting iron infusions in the past.  8. Need for vaccination against Streptococcus pneumoniae - Pneumococcal conjugate vaccine 20-valent  9. Need for Tdap vaccination - Tdap vaccine greater than or equal to 7yo IM   Patient was given the opportunity to ask questions.  Patient verbalized understanding of the plan and was able to repeat key elements of the plan.   This documentation was completed using Paediatric nurse.  Any transcriptional errors are unintentional.  Orders Placed This Encounter  Procedures   Pneumococcal conjugate vaccine 20-valent   Tdap vaccine greater than or equal to 7yo IM     Requested Prescriptions   Signed Prescriptions Disp Refills   solifenacin  (  VESICARE ) 10 MG tablet 30 tablet 4    Sig: Take 1 tablet (10 mg total) by mouth daily.   olopatadine (PATANOL) 0.1 % ophthalmic solution 5 mL 0    Sig: Place 1 drop into both eyes 2 (two) times daily.    Return in about 4 months (around 09/20/2024).  Barnie Louder, MD, FACP     [1]  Current Outpatient Medications on File Prior to Visit  Medication Sig Dispense Refill   acetaminophen  (TYLENOL ) 650 MG CR tablet Take 650 mg by mouth as needed for pain.     amLODipine  (NORVASC ) 10 MG tablet TAKE ONE TABLET BY MOUTH EVERY DAY. OFFICE VISIT as needed FOR additional REFILLS 90 tablet 1   azelastine  (ASTELIN ) 0.1 % nasal spray Place 2 sprays into both nostrils 2 (two) times daily. Use in each nostril as directed 30 mL 12   EPINEPHrine  0.3 mg/0.3 mL IJ SOAJ injection Inject 0.3 mLs into the muscle as needed.     escitalopram (LEXAPRO) 10 MG tablet Take by  mouth.     Fezolinetant  (VEOZAH ) 45 MG TABS Take 1 tablet (45 mg total) by mouth daily. 60 tablet 2   fluticasone  (FLONASE ) 50 MCG/ACT nasal spray Place 2 sprays into both nostrils in the morning and at bedtime. 16 g 6   hydrochlorothiazide  (HYDRODIURIL ) 12.5 MG tablet TAKE ONE TABLET BY MOUTH DAILY 90 tablet 3   ibuprofen  (ADVIL ) 600 MG tablet Take 1 tablet (600 mg total) by mouth every 8 (eight) hours as needed. 21 tablet 0   olmesartan  (BENICAR ) 20 MG tablet Take 1 tablet (20 mg total) by mouth daily. 30 tablet 3   phentermine  15 MG capsule Take 1 capsule (15 mg total) by mouth daily. 30 capsule 1   sulfaSALAzine (AZULFIDINE) 500 MG EC tablet Take 500 mg by mouth 2 (two) times daily.     albuterol  (VENTOLIN  HFA) 108 (90 Base) MCG/ACT inhaler Inhale 1-2 puffs into the lungs every 6 (six) hours as needed. (Patient not taking: Reported on 05/23/2024) 8 g 0   SUMAtriptan (IMITREX) 50 MG tablet Take by mouth. (Patient not taking: Reported on 05/23/2024)     No current facility-administered medications on file prior to visit.  [2]  Allergies Allergen Reactions   Infliximab  Anaphylaxis   Cozaar  [Losartan  Potassium] Other (See Comments)    Caused CP   Penicillins Rash    Has patient had a PCN reaction causing immediate rash, facial/tongue/throat swelling, SOB or lightheadedness with hypotension: Yes  Has patient had a PCN reaction causing severe rash involving mucus membranes or skin necrosis: Yes  Has patient had a PCN reaction that required hospitalization No  Has patient had a PCN reaction occurring within the last 10 years: No  If all of the above answers are NO, then may proceed with Cephalosporin use.  Has patient had a PCN reaction causing immediate rash, facial/tongue/throat swelling, SOB or lightheadedness with hypotension: Yes, Has patient had a PCN reaction causing severe rash involving mucus membranes or skin necrosis: Yes, Has patient had a PCN reaction that required  hospitalization No, Has patient had a PCN reaction occurring within the last 10 years: No, If all of the above answers are NO, then may proceed with Cephalosporin use.   "

## 2024-05-23 NOTE — Telephone Encounter (Signed)
 Please advise

## 2024-05-23 NOTE — Patient Instructions (Signed)
" °  VISIT SUMMARY: Today, you came in for a follow-up on weight management and acute left eye symptoms. We also discussed your rheumatoid arthritis, urinary incontinence, depression, menopausal symptoms, and anemia. You have made positive dietary changes and are staying active despite some soreness. You reported new symptoms in your left eye, and we addressed your ongoing health issues and medications.  YOUR PLAN: -CLASS 2 OBESITY: Class 2 obesity means having a body mass index (BMI) between 35 and 39.9. Your weight is stable at 215 lbs, and you have made positive dietary changes. Please check with your pharmacy regarding the phentermine  15 mg prescription. Continue with your dietary modifications and increased physical activity.  -RHEUMATOID ARTHRITIS: Rheumatoid arthritis is an autoimmune disease that causes joint inflammation. You are transitioning from Orencia to sulfasalazine due to insurance issues and will continue taking hydroxychloroquine and prednisone . Please continue with sulfasalazine 500 mg twice daily, hydroxychloroquine, and prednisone  5 mg daily.  -MIXED URINARY INCONTINENCE: Mixed urinary incontinence involves both stress and urge incontinence. You are currently on VESIcare  5 mg with some improvement. We have increased your dose to 10 mg daily. Please monitor for urinary retention and discontinue if you are unable to pass urine.  -MAJOR DEPRESSIVE DISORDER, SINGLE EPISODE, MODERATE: Major depressive disorder is a mental health condition characterized by persistent feelings of sadness and loss of interest. You are engaged in therapy with Dr. Marita Grossman and find it helpful. Please continue your therapy sessions.  -MENOPAUSAL SYNDROME: Menopausal syndrome includes symptoms like hot flashes due to hormonal changes. You were prescribed Veozah  for hot flashes but have not received it. Please check with your pharmacy regarding the prescription, and we will initiate the prior approval  process if required.  -IRON DEFICIENCY ANEMIA: Iron deficiency anemia is a condition where there is a lack of enough healthy red blood cells due to insufficient iron. Your hemoglobin is at 10.8 g/dL, and your anemia is well-managed. Please follow up for iron infusions as needed.  -ALLERGIC CONJUNCTIVITIS: Allergic conjunctivitis is an eye inflammation caused by allergic reactions. You have acute itching and drainage in your left eye with some crusting and mild blurred vision. We have prescribed Pacna allergy eye drops for both eyes and recommend applying warm compresses to your eyes twice daily for several days.  -GENERAL HEALTH MAINTENANCE: You have received pneumonia and tetanus vaccines and are awaiting mammogram results. Your bone density study was normal. Please continue with routine health maintenance.  INSTRUCTIONS: Please check with your pharmacy regarding the phentermine  15 mg and Veozah  prescriptions. Continue with your dietary modifications, increased physical activity, and therapy sessions. Follow up for iron infusions as  needed. Apply warm compresses to your eyes twice daily and use Pacna allergy eye drops as prescribed. Monitor for urinary retention with the increased VESIcare  dose and discontinue if unable to pass urine. Await mammogram results and continue routine health maintenance.                      Contains text generated by [employer]bridge.                                 Contains text generated by [employer].   "

## 2024-05-26 NOTE — Telephone Encounter (Signed)
 CLAUDIUS Fortis & spoke to the patient. Verified name & DOB. Informed of recommendation below. Inquired if she was about to get allergy eye drop and if it has alleviated. Patient stated that she has been unable to reach the pharmacy to get the prescription. Instructed patient to go in-person to inquire if there is a back order or a prior authorization needed. Patient expressed verbal understanding.

## 2024-05-26 NOTE — Telephone Encounter (Signed)
 Just an allergy but should still practice washing hands and not sharing pillows until resolves. Was she able to get allergy eye drop and has it help?

## 2024-06-11 ENCOUNTER — Ambulatory Visit (HOSPITAL_COMMUNITY): Admitting: Clinical

## 2024-06-18 ENCOUNTER — Ambulatory Visit (HOSPITAL_COMMUNITY): Admitting: Clinical

## 2024-06-24 ENCOUNTER — Emergency Department (HOSPITAL_COMMUNITY)

## 2024-06-24 ENCOUNTER — Other Ambulatory Visit: Payer: Self-pay

## 2024-06-24 ENCOUNTER — Emergency Department (HOSPITAL_COMMUNITY): Admission: EM | Admit: 2024-06-24 | Discharge: 2024-06-24 | Disposition: A | Discharge status: Home / Self Care

## 2024-06-24 DIAGNOSIS — M940 Chondrocostal junction syndrome [Tietze]: Secondary | ICD-10-CM | POA: Insufficient documentation

## 2024-06-24 LAB — BASIC METABOLIC PANEL WITH GFR
Anion gap: 9 (ref 5–15)
BUN: 8 mg/dL (ref 6–20)
CO2: 27 mmol/L (ref 22–32)
Calcium: 8.5 mg/dL — ABNORMAL LOW (ref 8.9–10.3)
Chloride: 103 mmol/L (ref 98–111)
Creatinine, Ser: 0.72 mg/dL (ref 0.44–1.00)
GFR, Estimated: >60 mL/min
Glucose, Bld: 88 mg/dL (ref 70–99)
Potassium: 3.6 mmol/L (ref 3.5–5.1)
Sodium: 138 mmol/L (ref 135–145)

## 2024-06-24 LAB — RESP PANEL BY RT-PCR (RSV, FLU A&B, COVID)  RVPGX2
Influenza A by PCR: NEGATIVE
Influenza B by PCR: NEGATIVE
Resp Syncytial Virus by PCR: NEGATIVE
SARS Coronavirus 2 by RT PCR: NEGATIVE

## 2024-06-24 LAB — CBC WITH DIFFERENTIAL/PLATELET
Abs Immature Granulocytes: 0.02 10*3/uL (ref 0.00–0.07)
Basophils Absolute: 0 10*3/uL (ref 0.0–0.1)
Basophils Relative: 0 %
Eosinophils Absolute: 0.2 10*3/uL (ref 0.0–0.5)
Eosinophils Relative: 3 %
HCT: 33.4 % — ABNORMAL LOW (ref 36.0–46.0)
Hemoglobin: 10.1 g/dL — ABNORMAL LOW (ref 12.0–15.0)
Immature Granulocytes: 0 %
Lymphocytes Relative: 23 %
Lymphs Abs: 1.4 10*3/uL (ref 0.7–4.0)
MCH: 23.4 pg — ABNORMAL LOW (ref 26.0–34.0)
MCHC: 30.2 g/dL (ref 30.0–36.0)
MCV: 77.5 fL — ABNORMAL LOW (ref 80.0–100.0)
Monocytes Absolute: 0.2 10*3/uL (ref 0.1–1.0)
Monocytes Relative: 3 %
Neutro Abs: 4.2 10*3/uL (ref 1.7–7.7)
Neutrophils Relative %: 71 %
Platelets: 347 10*3/uL (ref 150–400)
RBC: 4.31 MIL/uL (ref 3.87–5.11)
RDW: 15.5 % (ref 11.5–15.5)
WBC: 6 10*3/uL (ref 4.0–10.5)
nRBC: 0 % (ref 0.0–0.2)

## 2024-06-24 LAB — TROPONIN T, HIGH SENSITIVITY: Troponin T High Sensitivity: 8 ng/L (ref 0–19)

## 2024-06-24 MED ORDER — KETOROLAC TROMETHAMINE 15 MG/ML IJ SOLN
15.0000 mg | Freq: Once | INTRAMUSCULAR | Status: AC
  Administered 2024-06-24: 15 mg via INTRAVENOUS
  Filled 2024-06-24: qty 1

## 2024-06-24 MED ORDER — METHOCARBAMOL 500 MG PO TABS: 1000.0000 mg | ORAL_TABLET | Freq: Four times a day (QID) | ORAL | 0 refills | Status: AC | PRN

## 2024-06-24 MED ORDER — LACTATED RINGERS IV BOLUS
500.0000 mL | Freq: Once | INTRAVENOUS | Status: AC
  Administered 2024-06-24: 500 mL via INTRAVENOUS

## 2024-06-24 NOTE — Discharge Instructions (Addendum)
 You can use Robaxin  up to 4 times a day as needed.  Alternate Tylenol  Motrin  as needed for pain.  Stay on your other medications as prescribed and follow-up with your primary care doctor in 1 to 2 weeks.

## 2024-07-02 ENCOUNTER — Ambulatory Visit (HOSPITAL_COMMUNITY): Admitting: Clinical

## 2024-07-03 ENCOUNTER — Ambulatory Visit (HOSPITAL_COMMUNITY): Admitting: Clinical

## 2024-07-08 ENCOUNTER — Ambulatory Visit: Payer: 59

## 2024-07-17 ENCOUNTER — Ambulatory Visit (HOSPITAL_COMMUNITY): Admitting: Clinical

## 2024-07-31 ENCOUNTER — Ambulatory Visit (HOSPITAL_COMMUNITY): Admitting: Clinical

## 2024-09-25 ENCOUNTER — Ambulatory Visit: Payer: Self-pay | Admitting: Internal Medicine
